# Patient Record
Sex: Female | Born: 1985 | Race: Black or African American | Hispanic: No | Marital: Single | Smoking: Never smoker
Health system: Southern US, Community
[De-identification: ages and names within clinical notes are randomized; demographics above are authoritative.]

## PROBLEM LIST (undated history)

## (undated) ENCOUNTER — Emergency Department (HOSPITAL_COMMUNITY): Admission: EM | Payer: Medicaid Other | Source: Home / Self Care

## (undated) DIAGNOSIS — I10 Essential (primary) hypertension: Secondary | ICD-10-CM

## (undated) DIAGNOSIS — O4400 Placenta previa specified as without hemorrhage, unspecified trimester: Secondary | ICD-10-CM

## (undated) DIAGNOSIS — G629 Polyneuropathy, unspecified: Secondary | ICD-10-CM

---

## 2005-12-09 IMAGING — CR DG CHEST 2V
2 series · 2 of 2 positions shown · non-contrast
Comparison: none
 The heart size and mediastinal contours are within normal limits.  Both lungs are clear.  The visualized skeletal structures are unremarkable.

CLINICAL DATA: Cough.
 CHEST - 2 VIEW:

[w chest pa]
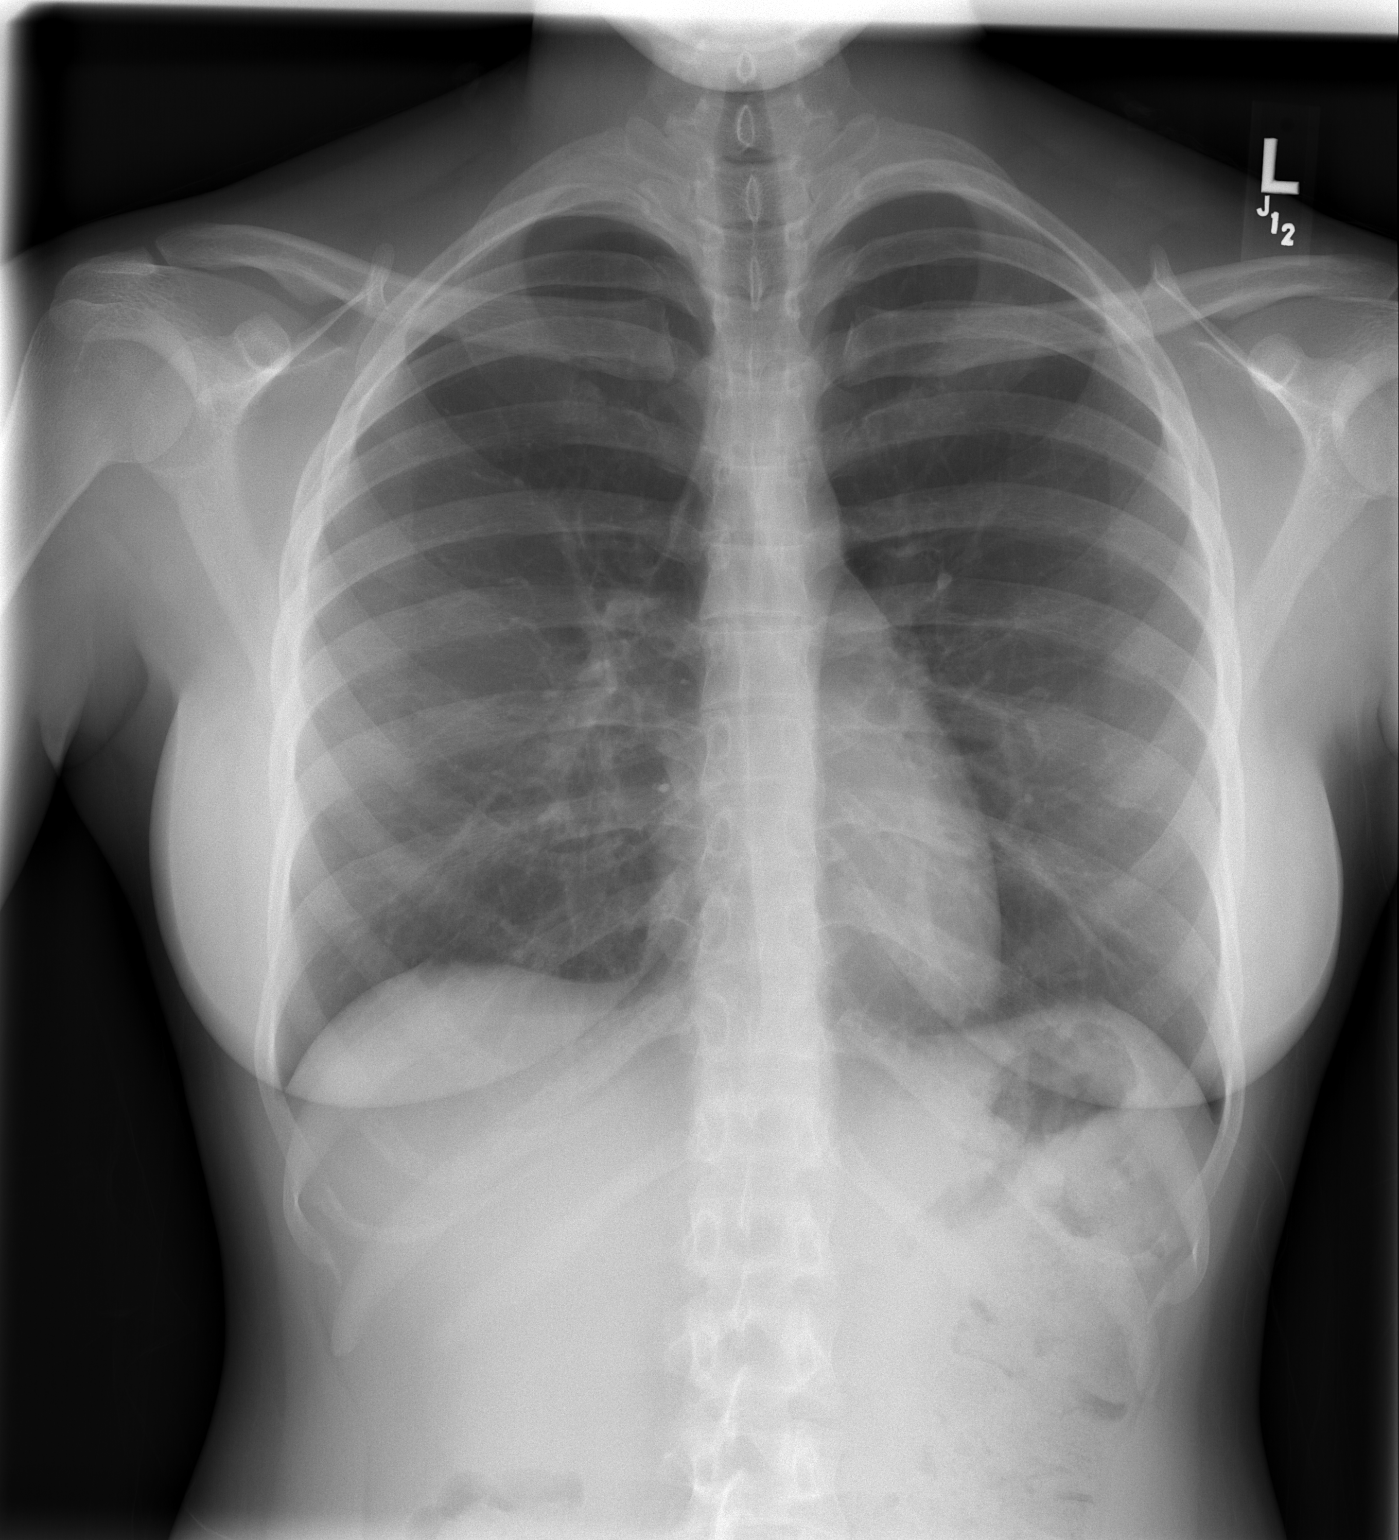

[w chest lat]
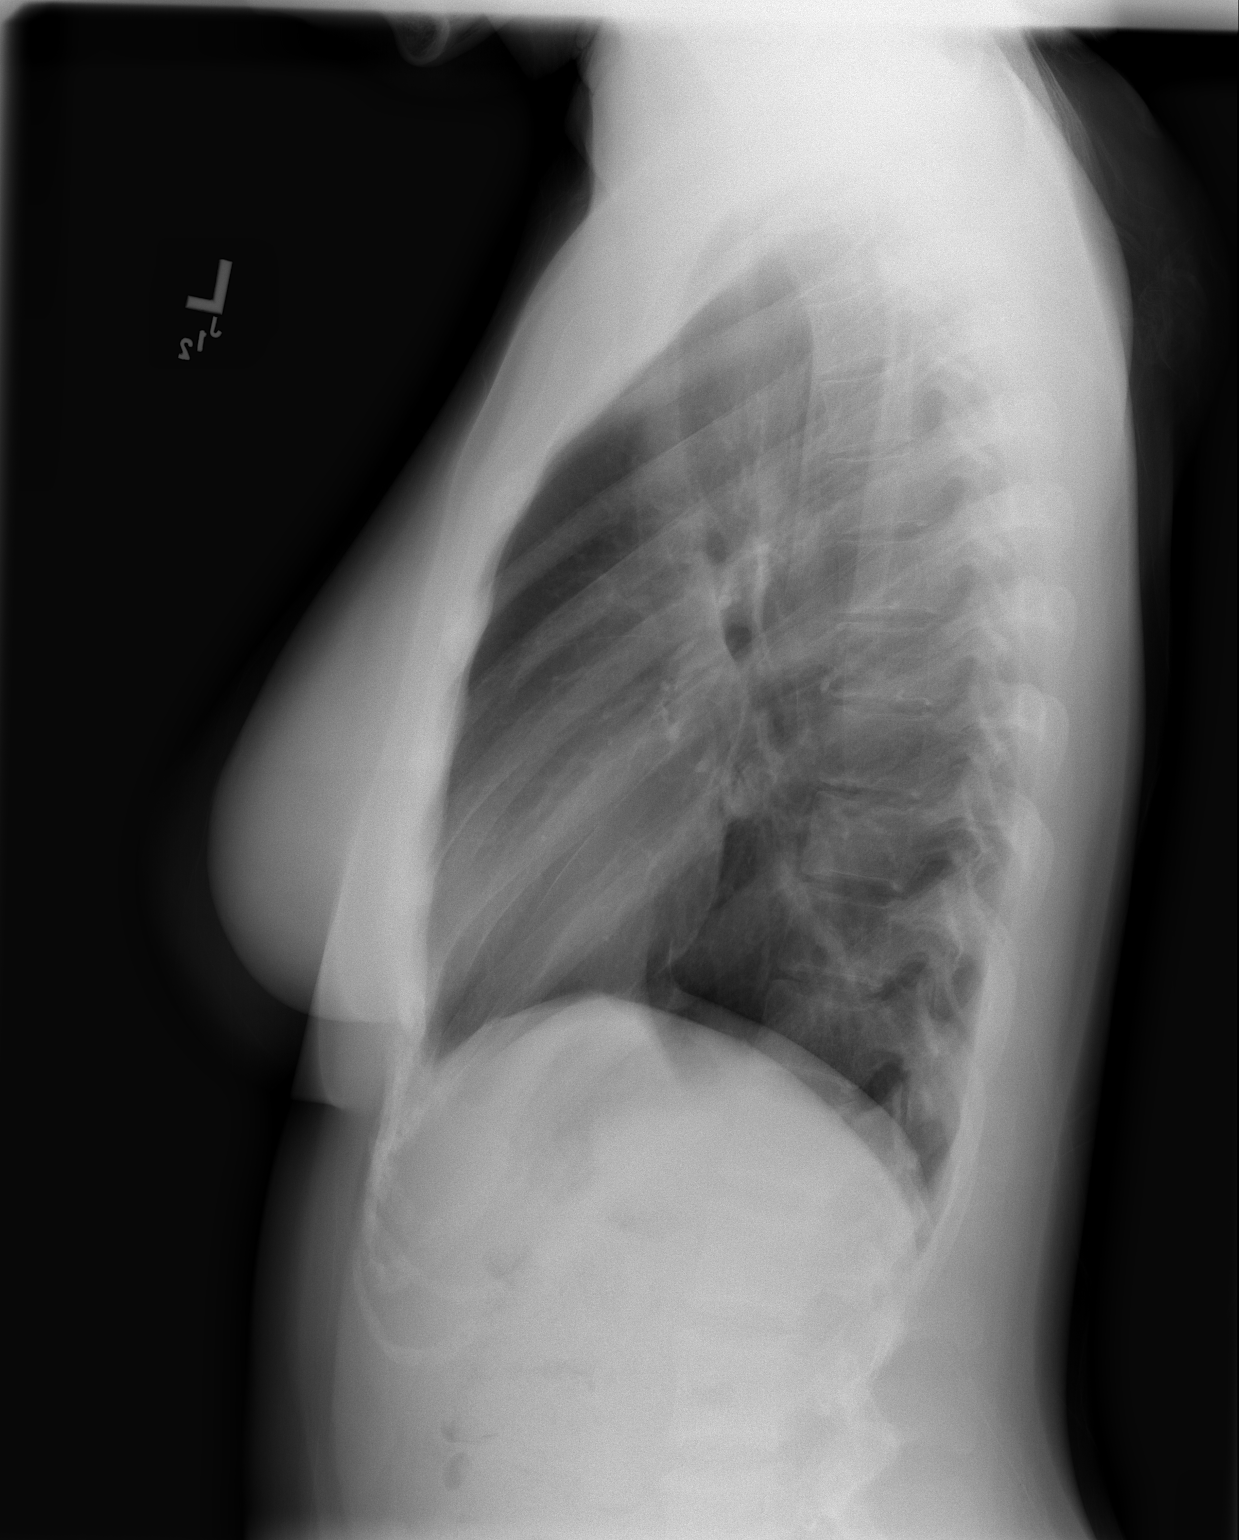

[2 of 2 positions shown; findings below may reference images not displayed]

IMPRESSION: No active cardiopulmonary disease.

## 2006-03-02 ENCOUNTER — Emergency Department (HOSPITAL_COMMUNITY): Admission: EM | Admit: 2006-03-02 | Discharge: 2006-03-02 | Payer: Self-pay | Admitting: Emergency Medicine

## 2007-04-22 ENCOUNTER — Inpatient Hospital Stay (HOSPITAL_COMMUNITY): Admission: AD | Admit: 2007-04-22 | Discharge: 2007-04-22 | Payer: Self-pay | Admitting: Obstetrics and Gynecology

## 2007-04-22 IMAGING — US US OB COMP LESS 14 WK
1 series · 14 of 28 positions shown · non-contrast
Comparison: none

OBSTETRICAL ULTRASOUND:
 This ultrasound exam was performed in the [HOSPITAL] Ultrasound Department.  The OB US report was generated in the AS system, and faxed to the ordering physician.  This report is also available in [REDACTED] PACS.

[Series 1: us ob comp less 14 wk · 0.19mm/px · 14 of 29 slices shown]
[im 2/29]
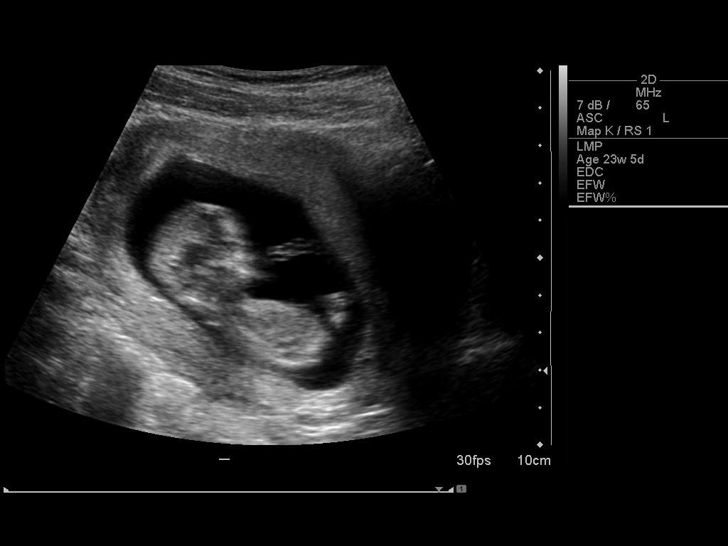
[im 4/29]
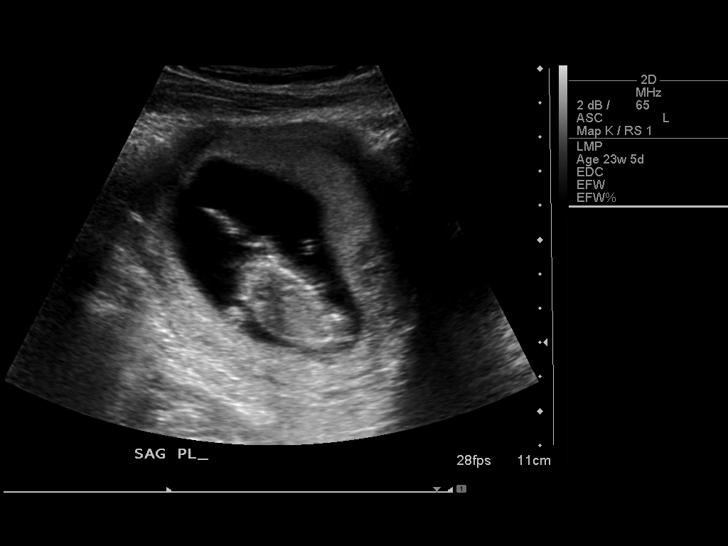
[im 6/29]
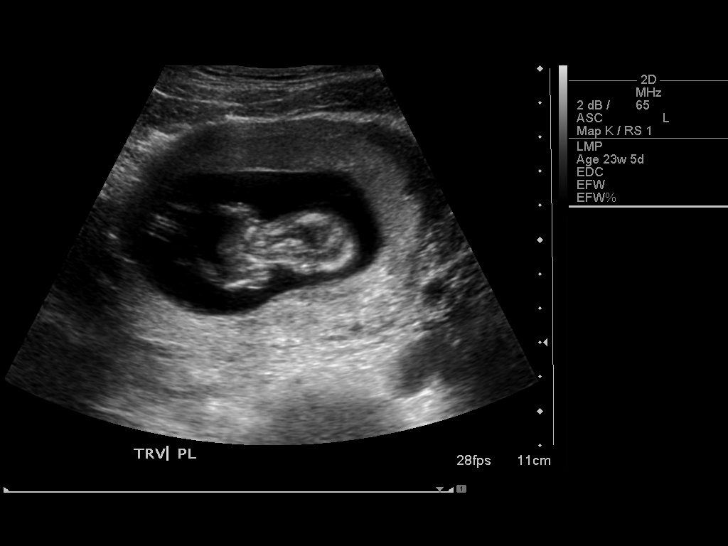
[im 8/29]
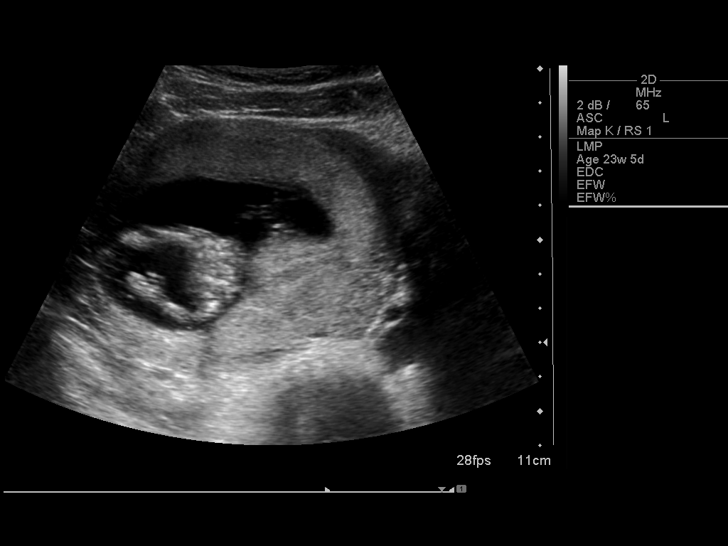
[im 10/29]
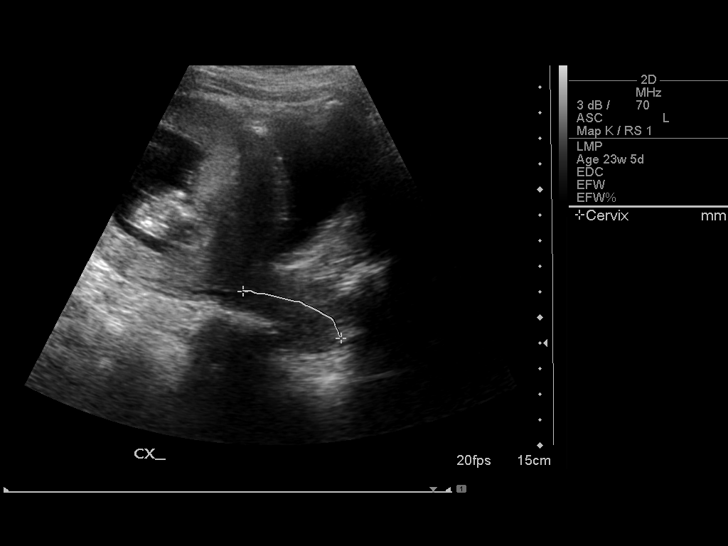
[im 12/29]
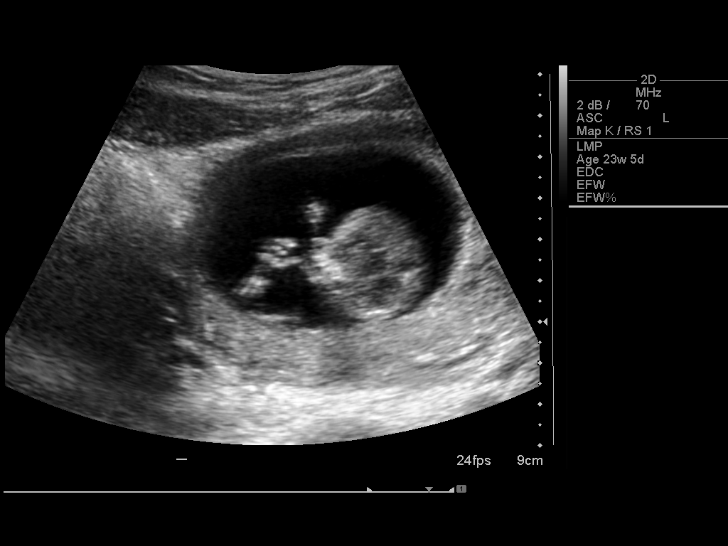
[im 14/29]
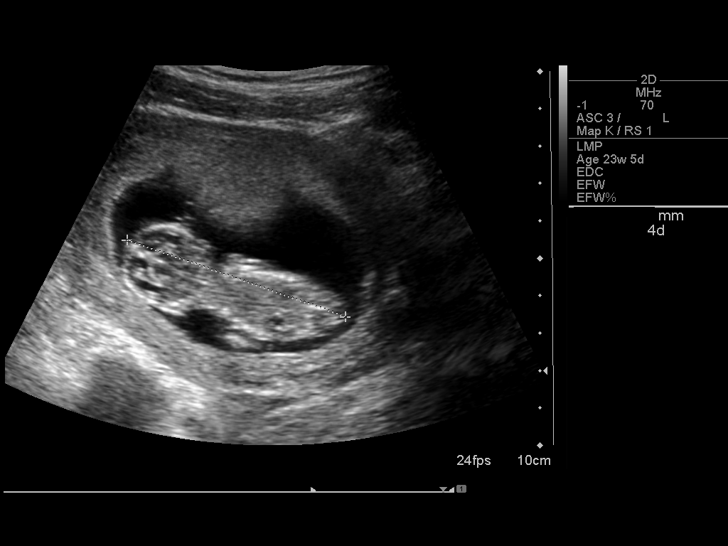
[im 16/29]
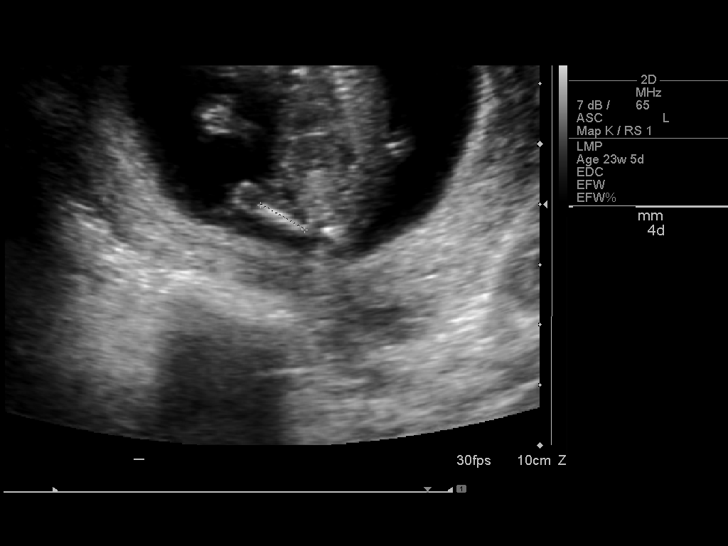
[im 18/29]
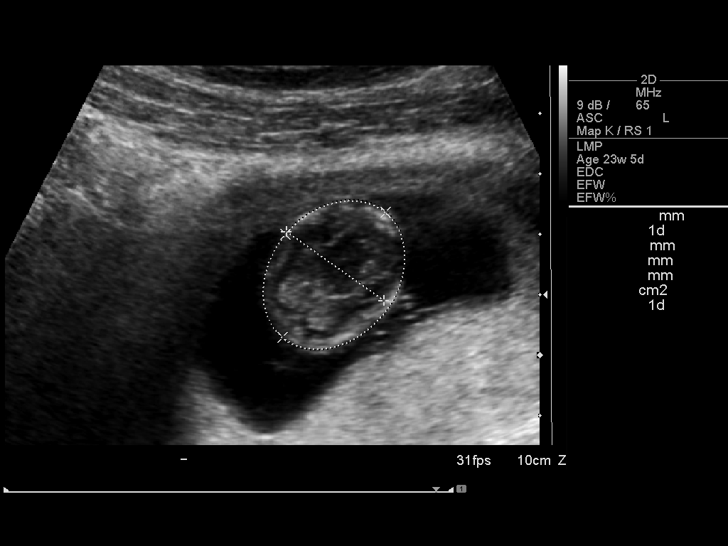
[im 20/29]
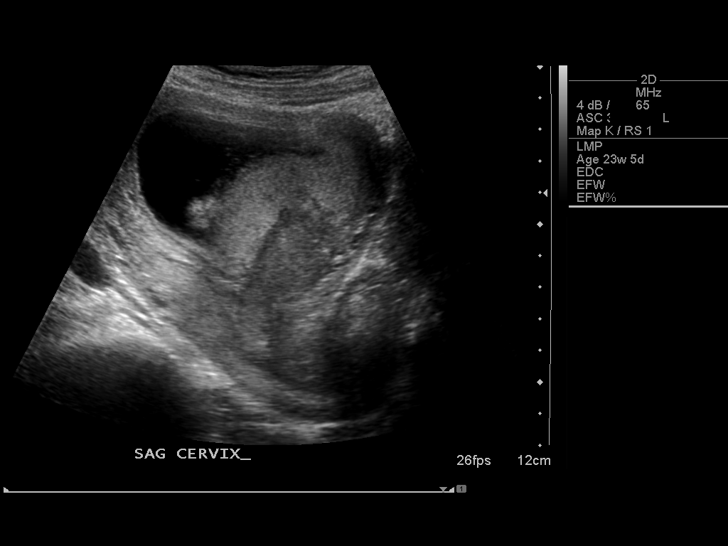
[im 22/29]
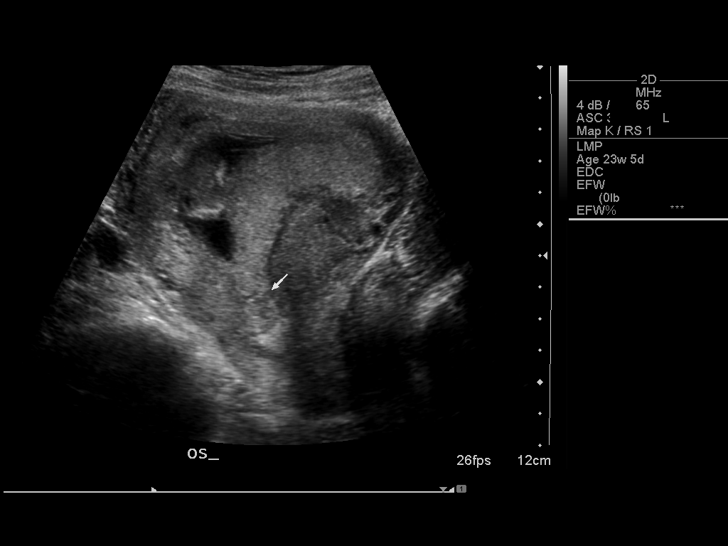
[im 24/29]
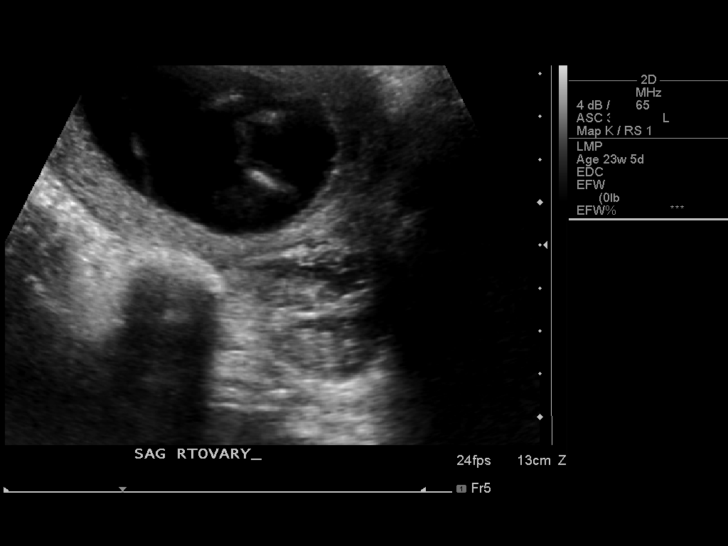
[im 26/29]
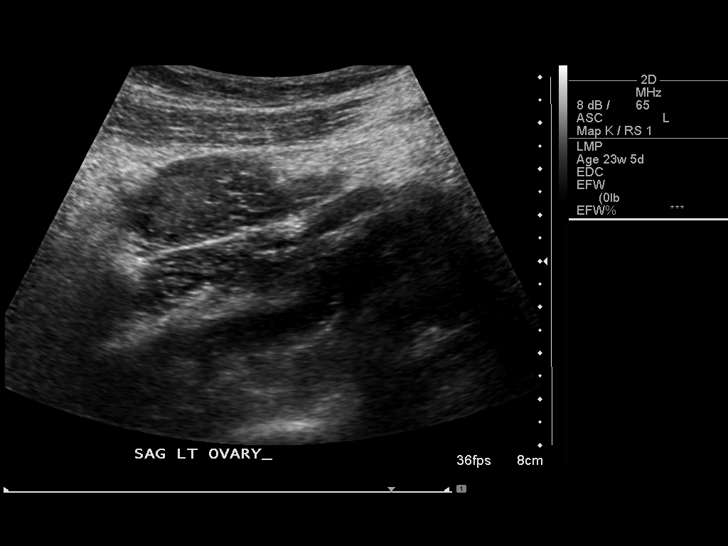
[im 29/29]
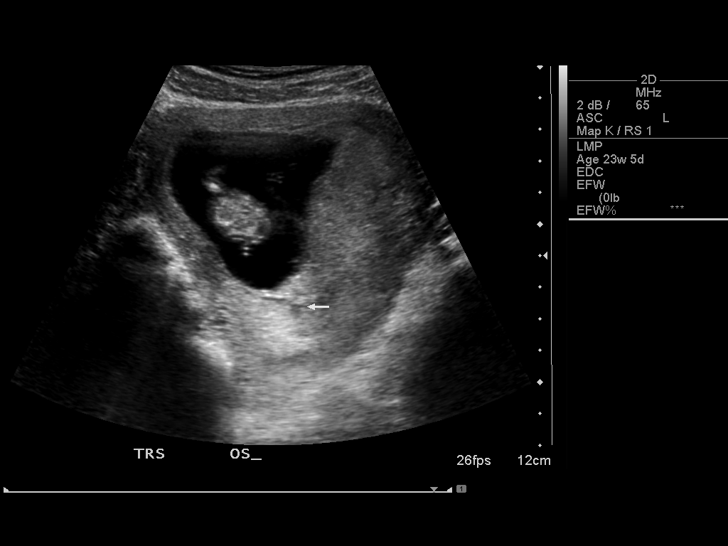

[14 of 28 positions shown; findings below may reference images not displayed]

IMPRESSION: See AS Obstetric US report.

## 2007-06-01 ENCOUNTER — Ambulatory Visit (HOSPITAL_COMMUNITY): Admission: RE | Admit: 2007-06-01 | Discharge: 2007-06-01 | Payer: Self-pay | Admitting: Obstetrics and Gynecology

## 2007-06-01 IMAGING — US US OB DETAIL+14 WK
1 series · 14 of 28 positions shown · non-contrast
Comparison: none

OBSTETRICAL ULTRASOUND:
 This ultrasound exam was performed in the [HOSPITAL] Ultrasound Department.  The OB US report was generated in the AS system, and faxed to the ordering physician.  This report is also available in [REDACTED] PACS.

[Series 1: us ob detail +14 wk · 14 of 87 slices shown]
[im 4/87]
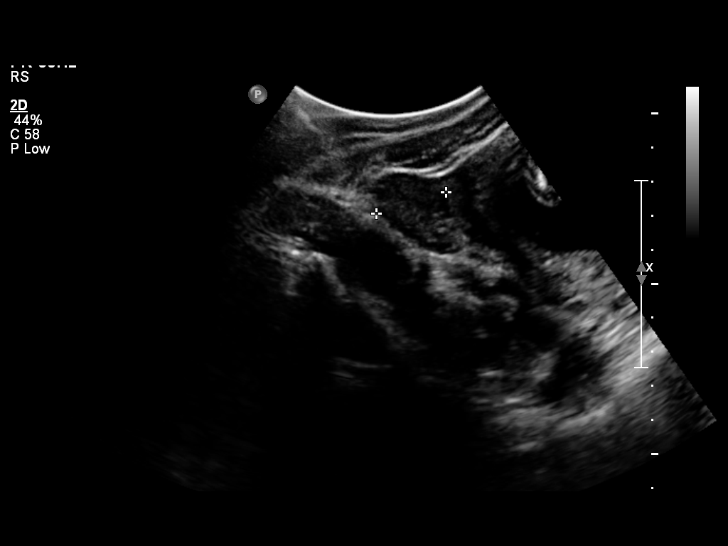
[im 10/87]
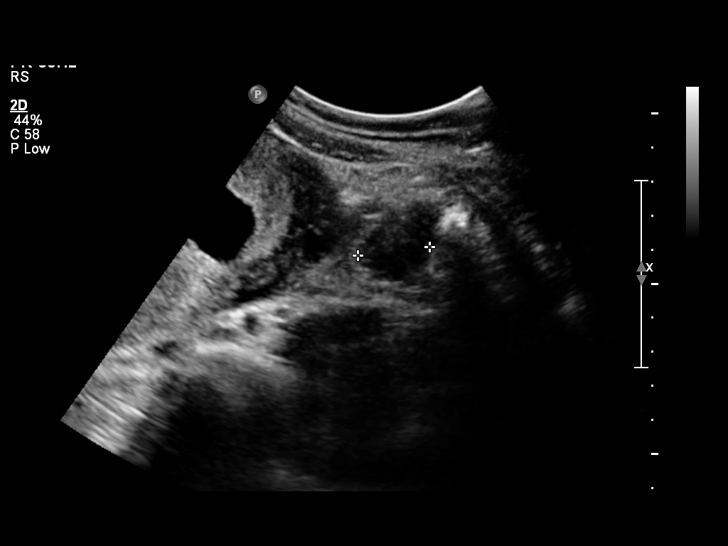
[im 16/87]
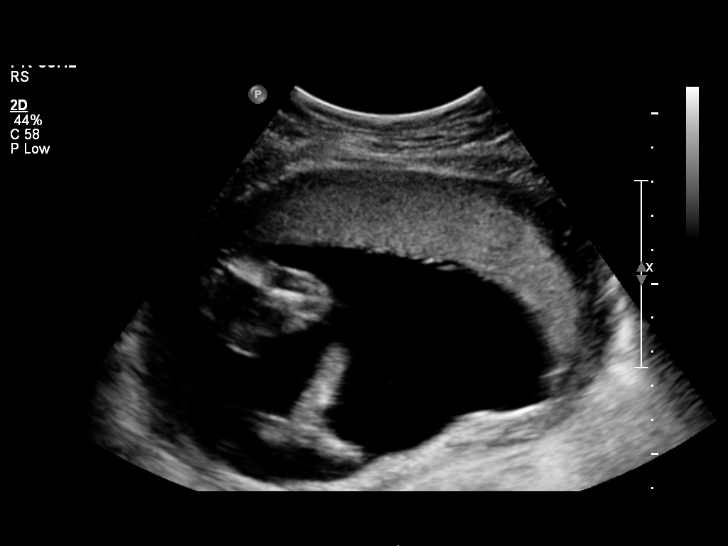
[im 23/87]
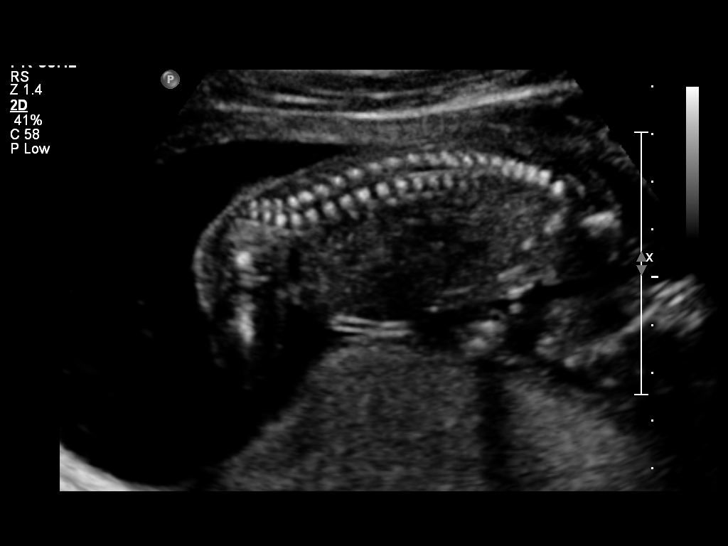
[im 29/87]
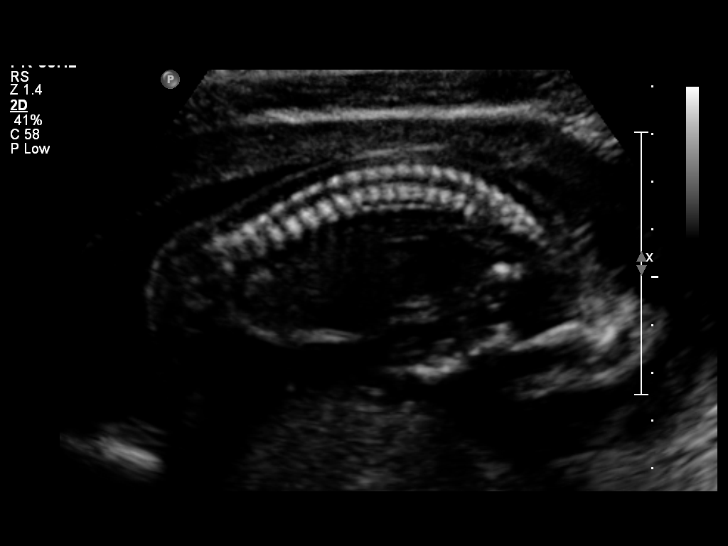
[im 36/87]
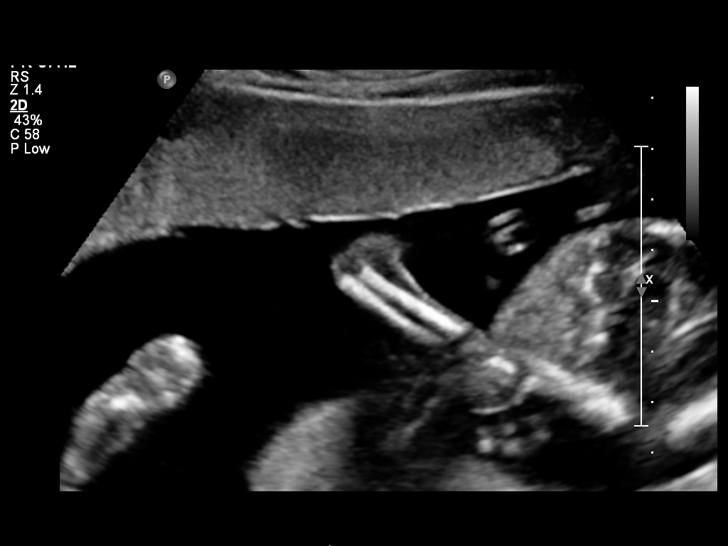
[im 42/87]
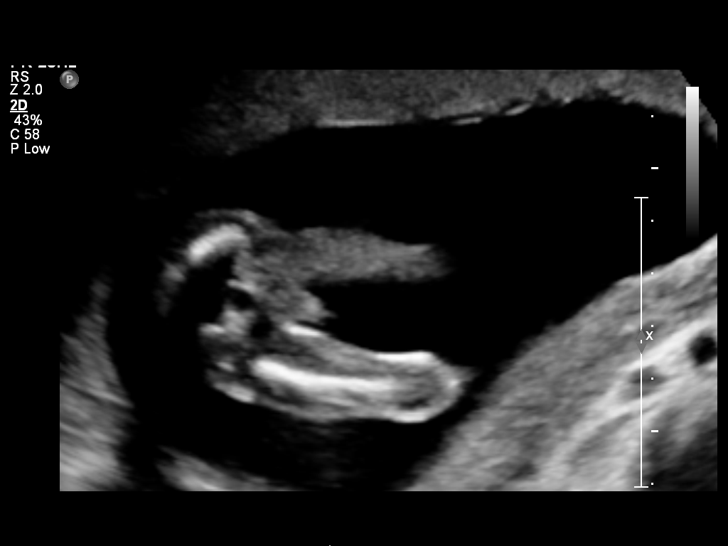
[im 48/87]
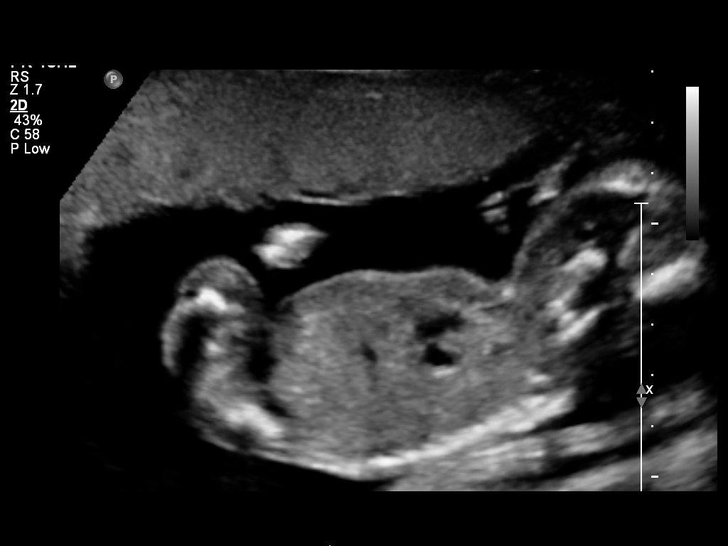
[im 55/87]
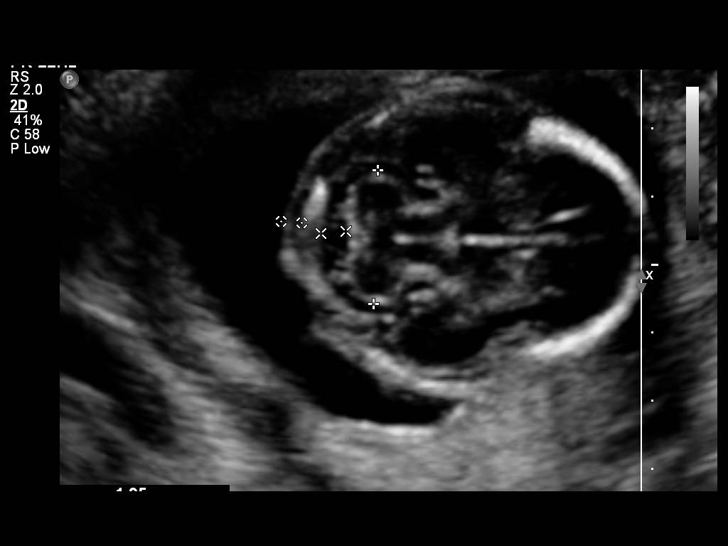
[im 61/87]
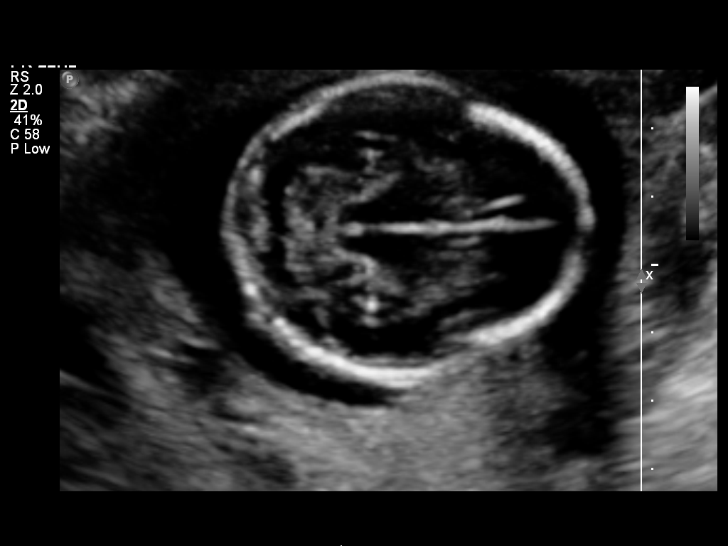
[im 67/87]
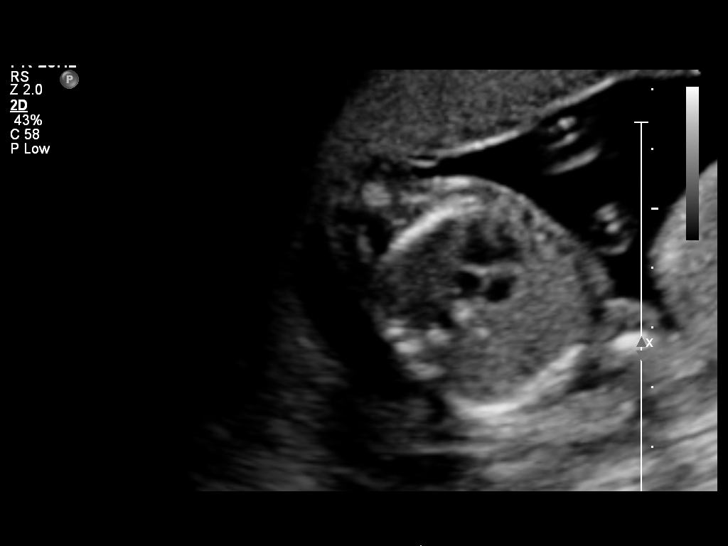
[im 74/87]
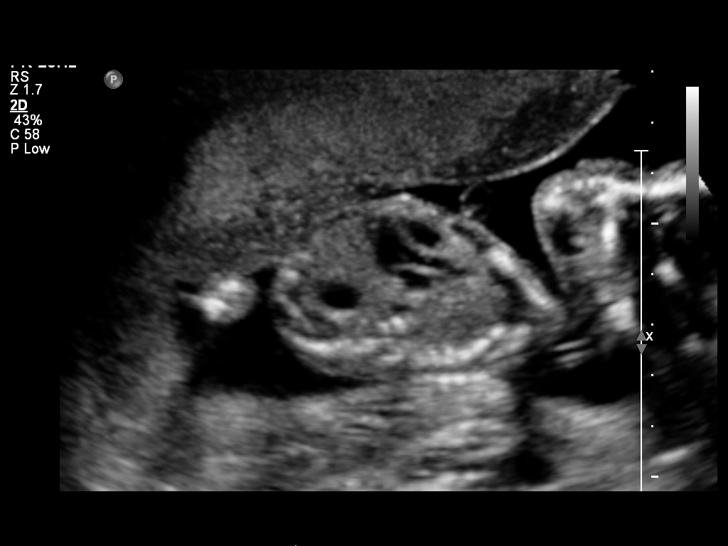
[im 80/87]
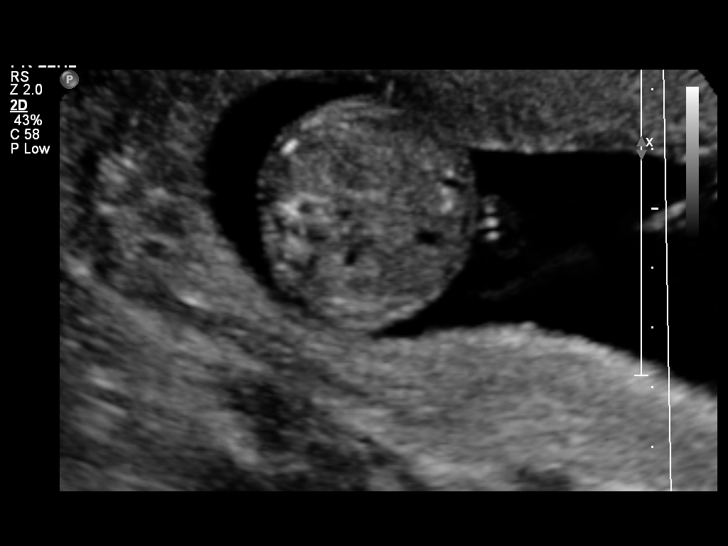
[im 87/87]
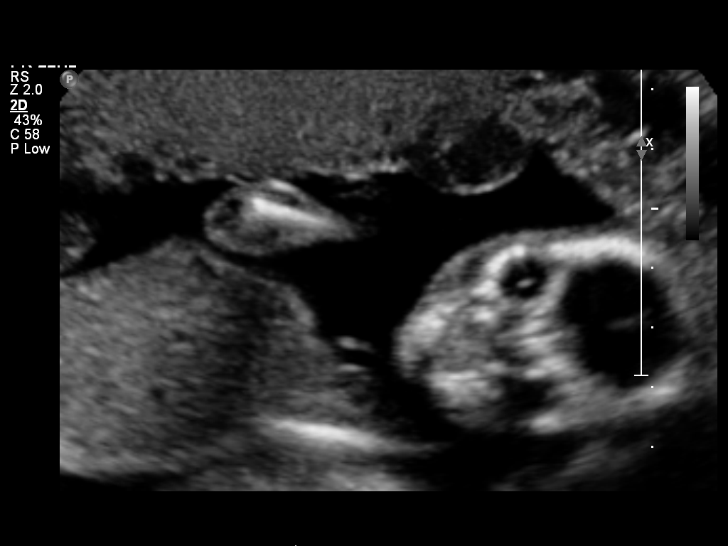

[14 of 28 positions shown; findings below may reference images not displayed]

IMPRESSION: See AS Obstetric US report.

## 2007-09-10 ENCOUNTER — Ambulatory Visit (HOSPITAL_COMMUNITY): Admission: RE | Admit: 2007-09-10 | Discharge: 2007-09-10 | Payer: Self-pay | Admitting: Obstetrics

## 2007-09-10 IMAGING — US US OB LIMITED
1 series · 14 of 28 positions shown · non-contrast
Comparison: none

OBSTETRICAL ULTRASOUND:
 This ultrasound was performed in The [HOSPITAL], and the [facility] OB/GYN report will be stored to [REDACTED] PACS.

[Series 1: us ob limited · 14 of 56 slices shown]
[im 3/56]
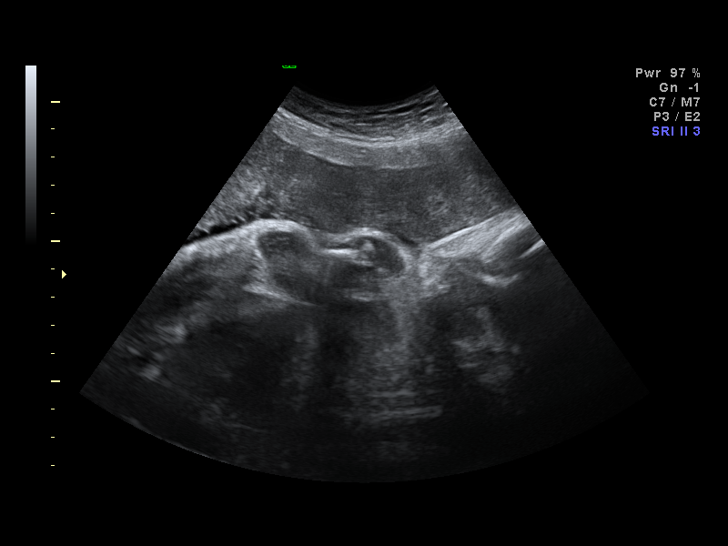
[im 7/56]
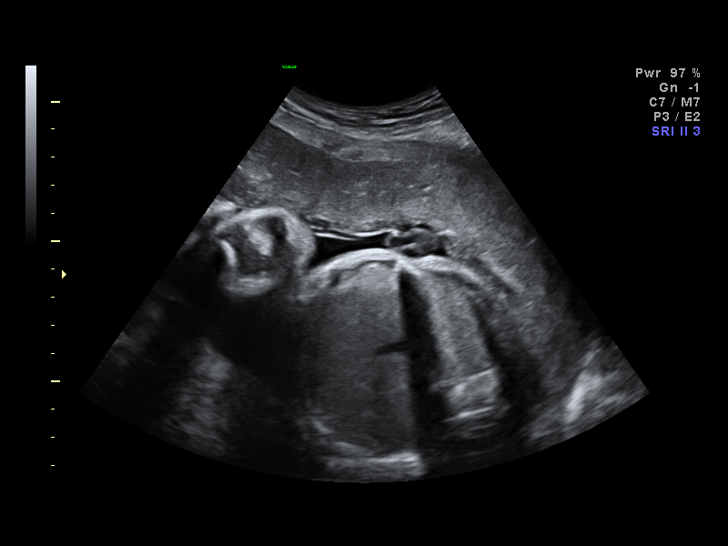
[im 11/56]
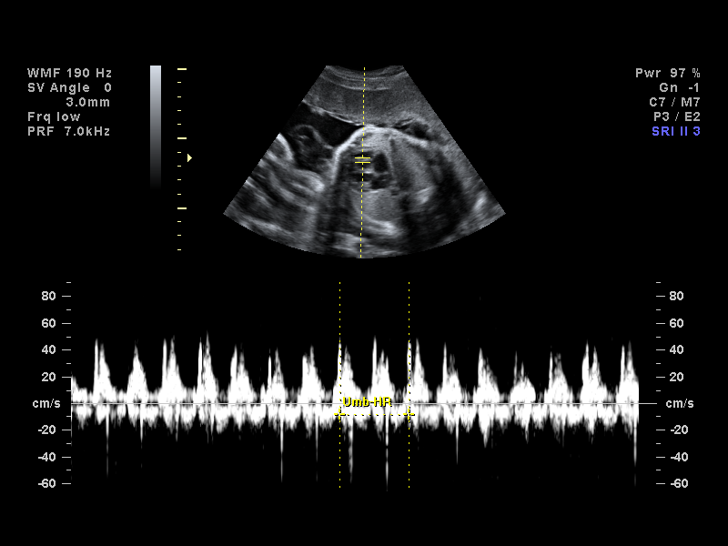
[im 15/56]
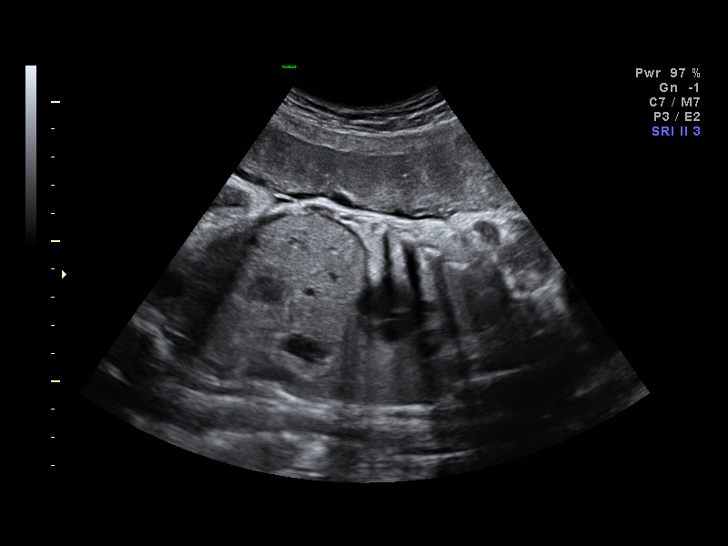
[im 19/56]
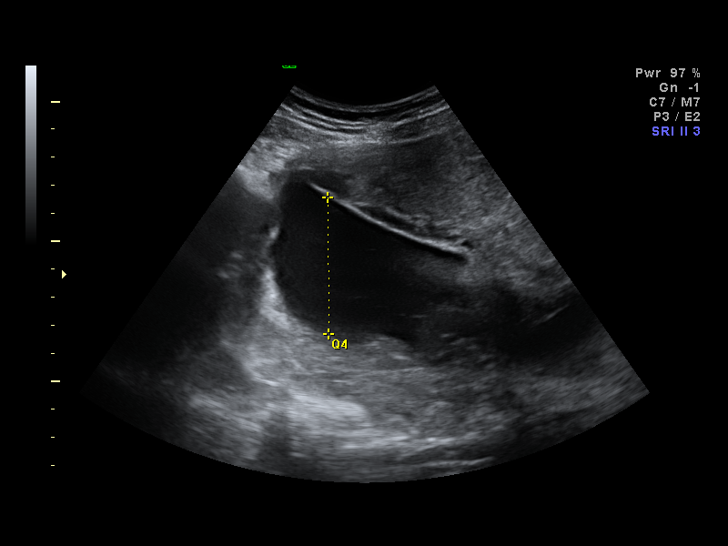
[im 23/56]
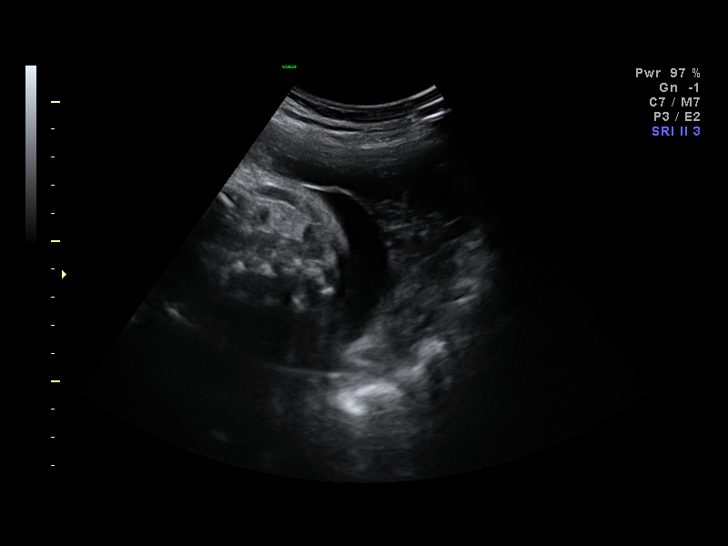
[im 27/56]
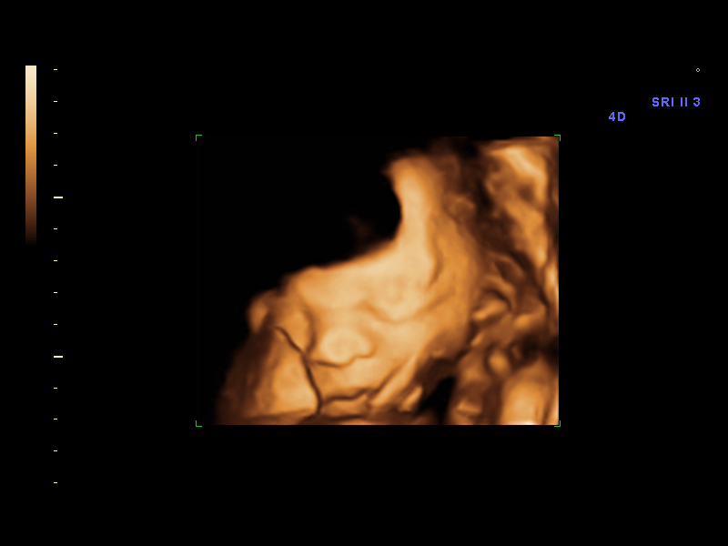
[im 31/56]
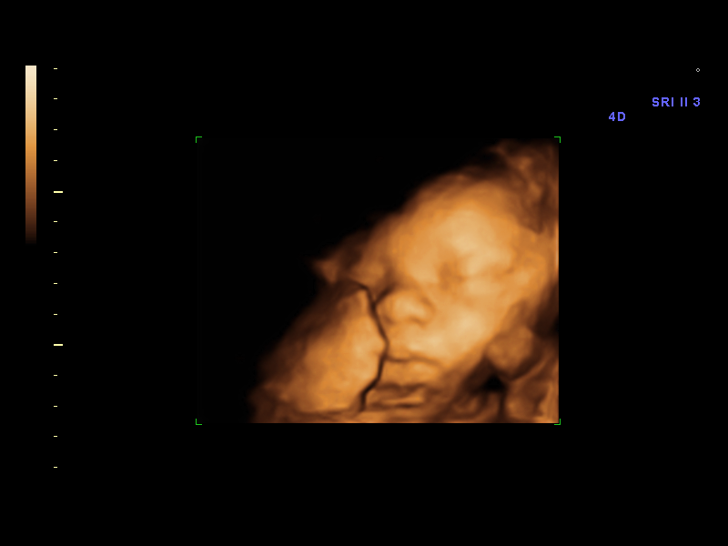
[im 35/56]
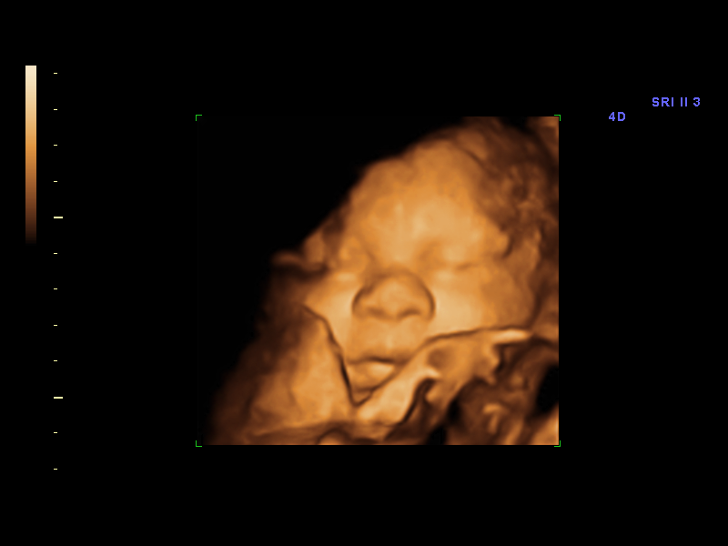
[im 39/56]
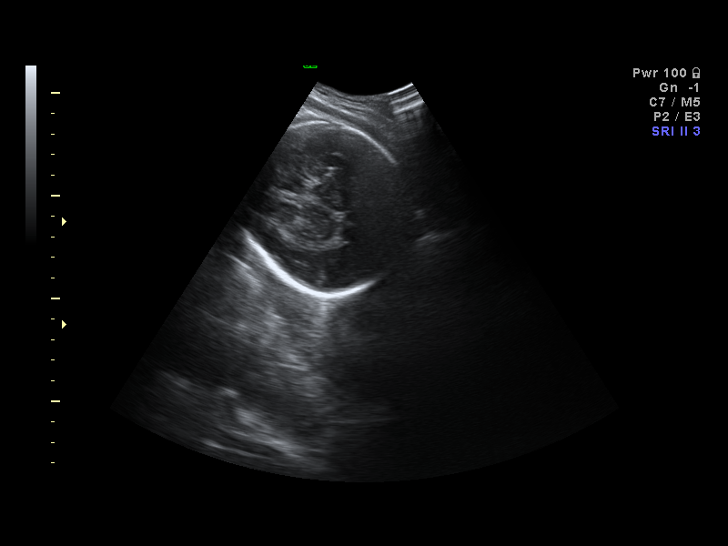
[im 43/56]
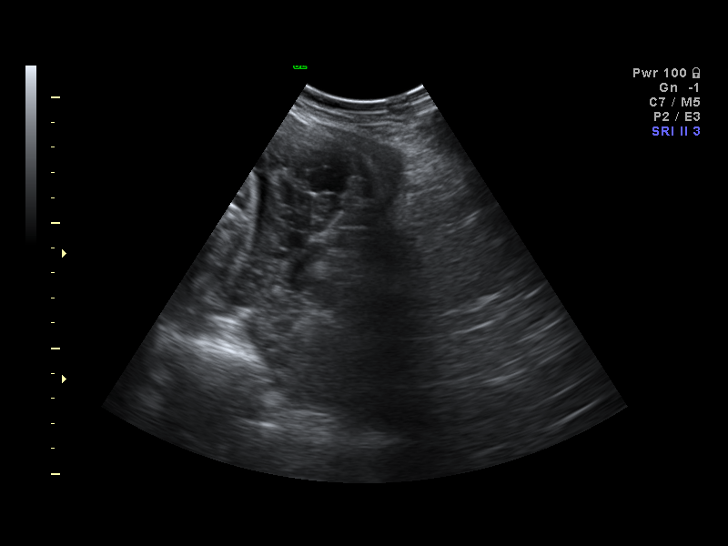
[im 47/56]
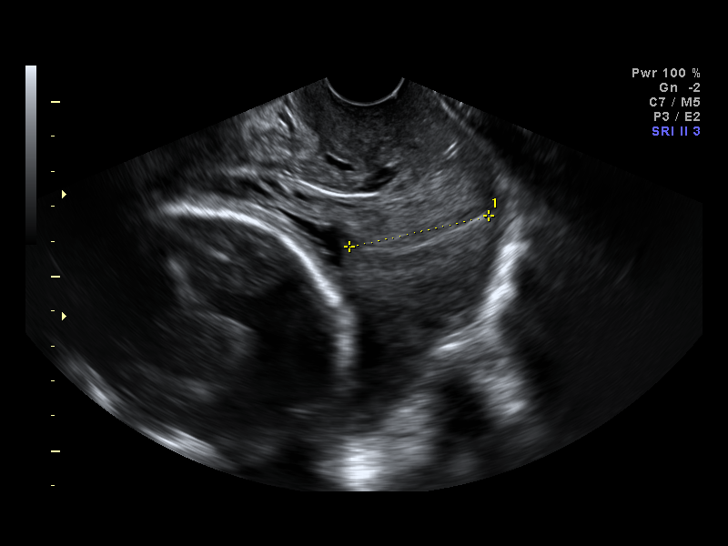
[im 51/56]
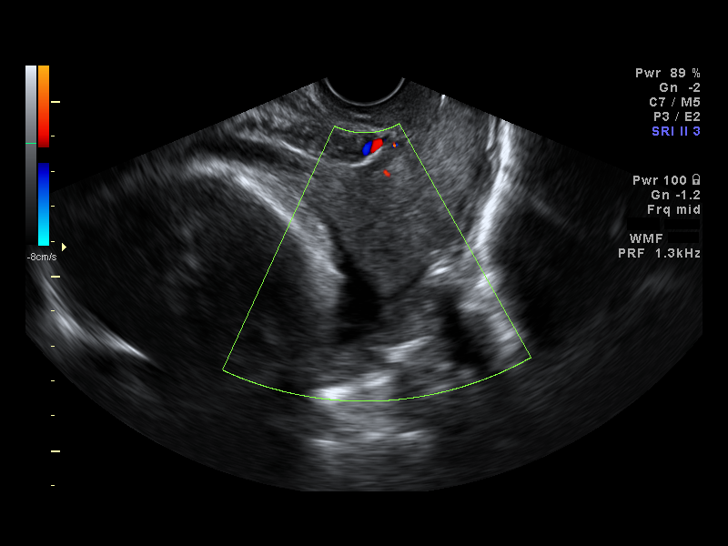
[im 56/56]
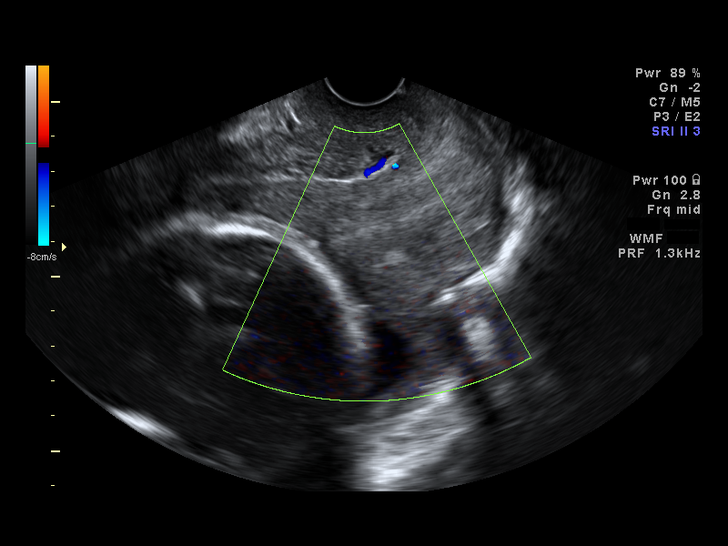

[14 of 28 positions shown; findings below may reference images not displayed]

IMPRESSION: [facility] OB/GYN has also been faxed to the ordering physician.

## 2007-11-04 ENCOUNTER — Inpatient Hospital Stay (HOSPITAL_COMMUNITY): Admission: AD | Admit: 2007-11-04 | Discharge: 2007-11-06 | Payer: Self-pay | Admitting: Obstetrics

## 2010-10-22 LAB — URINE CULTURE: Colony Count: >=100000

## 2010-10-22 LAB — URINALYSIS, ROUTINE W REFLEX MICROSCOPIC
Bilirubin Urine: NEGATIVE
Ketones, ur: NEGATIVE
Nitrite: POSITIVE — AB
Specific Gravity, Urine: 1.015
Urobilinogen, UA: 0.2
pH: 7

## 2010-10-22 LAB — WET PREP, GENITAL: Clue Cells Wet Prep HPF POC: NONE SEEN

## 2010-10-22 LAB — GC/CHLAMYDIA PROBE AMP, GENITAL: Chlamydia, DNA Probe: NEGATIVE

## 2010-10-22 LAB — CBC
MCV: 88.9
RDW: 14
WBC: 7.7

## 2010-10-22 LAB — URINE MICROSCOPIC-ADD ON

## 2010-10-29 LAB — RPR: RPR Ser Ql: NONREACTIVE

## 2010-10-29 LAB — CREATININE, SERUM: Creatinine, Ser: 0.78

## 2010-10-29 LAB — CBC
Hemoglobin: 10.6 — ABNORMAL LOW
MCHC: 33.1
MCHC: 33.5
RBC: 3.64 — ABNORMAL LOW
RDW: 15
WBC: 21.3 — ABNORMAL HIGH
WBC: 8.8

## 2012-05-02 ENCOUNTER — Encounter (HOSPITAL_COMMUNITY): Payer: Self-pay | Admitting: Emergency Medicine

## 2012-05-02 ENCOUNTER — Emergency Department (HOSPITAL_COMMUNITY)
Admission: EM | Admit: 2012-05-02 | Discharge: 2012-05-02 | Disposition: A | Discharge status: Home / Self Care | Payer: Medicaid Other | Attending: Emergency Medicine | Admitting: Emergency Medicine

## 2012-05-02 DIAGNOSIS — R0609 Other forms of dyspnea: Secondary | ICD-10-CM | POA: Insufficient documentation

## 2012-05-02 DIAGNOSIS — R0989 Other specified symptoms and signs involving the circulatory and respiratory systems: Secondary | ICD-10-CM | POA: Insufficient documentation

## 2012-05-02 DIAGNOSIS — R51 Headache: Secondary | ICD-10-CM | POA: Insufficient documentation

## 2012-05-02 DIAGNOSIS — K047 Periapical abscess without sinus: Secondary | ICD-10-CM | POA: Insufficient documentation

## 2012-05-02 DIAGNOSIS — R220 Localized swelling, mass and lump, head: Secondary | ICD-10-CM | POA: Insufficient documentation

## 2012-05-02 DIAGNOSIS — R221 Localized swelling, mass and lump, neck: Secondary | ICD-10-CM | POA: Insufficient documentation

## 2012-05-02 MED ORDER — HYDROCODONE-ACETAMINOPHEN 5-325 MG PO TABS: ORAL_TABLET | ORAL | Status: DC

## 2012-05-02 MED ORDER — IBUPROFEN 600 MG PO TABS: 600.0000 mg | ORAL_TABLET | Freq: Four times a day (QID) | ORAL | Status: DC | PRN

## 2012-05-02 MED ORDER — PENICILLIN V POTASSIUM 500 MG PO TABS: 500.0000 mg | ORAL_TABLET | Freq: Three times a day (TID) | ORAL | Status: DC

## 2012-05-02 NOTE — ED Provider Notes (Signed)
History    This chart was scribed for non-physician practitioner working with Nelia Shi, MD by Frederik Pear, ED Scribe. This patient was seen in room WTR7/WTR7 and the patient's care was started at 1533.   CSN: [id number]  Arrival date & time 05/02/12  1434   First MD Initiated Contact with Patient 05/02/12 1533      Chief Complaint  Patient presents with  . Dental Pain    (Consider location/radiation/quality/duration/timing/severity/associated sxs/prior treatment) The history is provided by the patient and medical records. No language interpreter was used.    Tanya Carroll is a 27 y.o. female who presents to the Emergency Department complaining of sudden onset, gradually worsening dental pain that began 7 days ago with associated headache and left-sided facial swelling with radiating jaw pain that began yesterday when she awoke. She denies any SOB, difficulty swallowing, or fever. She reports that she has been treating the dental pain at home with Advil with no relief. She is established with a dentist, but is unable to get an appointment until Apr 29.  History reviewed. No pertinent past medical history.  History reviewed. No pertinent past surgical history.  Family History  Problem Relation Age of Onset  . Hypertension Mother   . Hypertension Sister     History  Substance Use Topics  . Smoking status: Not on file  . Smokeless tobacco: Never Used  . Alcohol Use: Yes     Comment: Once or twice a week.    OB History   Grav Para Term Preterm Abortions TAB SAB Ect Mult Living   1 1 1              Review of Systems  Constitutional: Negative for fever.  HENT: Positive for facial swelling and dental problem. Negative for ear pain, sore throat, trouble swallowing and neck pain.   Respiratory: Negative for shortness of breath and stridor.   Skin: Negative for color change.  Neurological: Positive for headaches.    Allergies  Review of patient's allergies  indicates no known allergies.  Home Medications   Current Outpatient Rx  Name  Route  Sig  Dispense  Refill  . ibuprofen (ADVIL,MOTRIN) 200 MG tablet   Oral   Take 400 mg by mouth every 6 (six) hours as needed for pain.           BP 125/84  Pulse 77  Temp(Src) 98.1 F (36.7 C) (Oral)  Ht 5\' 3"  (1.6 m)  Wt 165 lb (74.844 kg)  BMI 29.24 kg/m2  SpO2 100%  Physical Exam  Nursing note and vitals reviewed. Constitutional: She appears well-developed and well-nourished.  HENT:  Head: Normocephalic and atraumatic. No trismus in the jaw.  Right Ear: Tympanic membrane, external ear and ear canal normal.  Left Ear: Tympanic membrane, external ear and ear canal normal.  Nose: Nose normal.  Mouth/Throat: Uvula is midline, oropharynx is clear and moist and mucous membranes are normal. Abnormal dentition. Dental caries present. No dental abscesses or edematous. No tonsillar abscesses.  Second left mandibular molar is broken, and gumline tenderness is present in that area. No palpable abscess. Erythema and swelling over the angle of the left jaw.  Eyes: Conjunctivae are normal.  Neck: Normal range of motion. Neck supple.  No neck swelling or Ludwig's angina  Pulmonary/Chest: She is in respiratory distress.  Abdominal: There is no tenderness.  Musculoskeletal: Normal range of motion. She exhibits no edema.  Lymphadenopathy:    She has no cervical adenopathy.  Neurological:  She is alert. Coordination normal.  Skin: Skin is warm and dry.  Psychiatric: She has a normal mood and affect. Her behavior is normal. Judgment and thought content normal.    ED Course  Procedures (including critical care time)  DIAGNOSTIC STUDIES: Oxygen Saturation is 100% on room air, normal by my interpretation.    COORDINATION OF CARE:  15:43- Discussed planned course of treatment with the patient, including Vicodin, Advil, and Veetid, who is agreeable at this time.  Labs Reviewed - No data to display No  results found.   1. Dental abscess     Patient seen and examined.   Vital signs reviewed and are as follows: Filed Vitals:   05/02/12 1500  BP: 125/84  Pulse: 77  Temp: 98.1 F (36.7 C)    Patient counseled to take prescribed medications as directed, return with worsening facial or neck swelling, and to follow-up with their dentist as soon as possible.   Patient counseled on use of narcotic pain medications. Counseled not to combine these medications with others containing tylenol. Urged not to drink alcohol, drive, or perform any other activities that requires focus while taking these medications. The patient verbalizes understanding and agrees with the plan.   MDM  Patient with toothache with likely early periapical abscess  Exam unconcerning for Ludwig's angina or other deep tissue infection in neck.  Will treat with penicillin and pain medicine.  Urged patient to follow-up with dentist.     I personally performed the services described in this documentation, which was scribed in my presence. The recorded information has been reviewed and is accurate.        Renne Crigler, PA-C 05/02/12 1640

## 2012-05-02 NOTE — ED Notes (Addendum)
Pt states tooth on left side has been hurting since last Sunday. Pt states she woke up yesterday and left side of her mouth was swollen and painful. Pt states she has been taking Advil to relieve tooth pain but med is uneffective.

## 2012-05-03 NOTE — ED Provider Notes (Signed)
Medical screening examination/treatment/procedure(s) were performed by non-physician practitioner and as supervising physician I was immediately available for consultation/collaboration.    Elek Holderness L Leyani Gargus, MD 05/03/12 1551 

## 2012-05-15 ENCOUNTER — Encounter (HOSPITAL_COMMUNITY): Payer: Self-pay | Admitting: *Deleted

## 2012-05-15 ENCOUNTER — Emergency Department (HOSPITAL_COMMUNITY)
Admission: EM | Admit: 2012-05-15 | Discharge: 2012-05-15 | Disposition: A | Discharge status: Home / Self Care | Payer: Medicaid Other | Attending: Emergency Medicine | Admitting: Emergency Medicine

## 2012-05-15 DIAGNOSIS — K089 Disorder of teeth and supporting structures, unspecified: Secondary | ICD-10-CM | POA: Insufficient documentation

## 2012-05-15 DIAGNOSIS — K0889 Other specified disorders of teeth and supporting structures: Secondary | ICD-10-CM

## 2012-05-15 DIAGNOSIS — R42 Dizziness and giddiness: Secondary | ICD-10-CM | POA: Insufficient documentation

## 2012-05-15 DIAGNOSIS — K029 Dental caries, unspecified: Secondary | ICD-10-CM | POA: Insufficient documentation

## 2012-05-15 DIAGNOSIS — R51 Headache: Secondary | ICD-10-CM | POA: Insufficient documentation

## 2012-05-15 DIAGNOSIS — R630 Anorexia: Secondary | ICD-10-CM | POA: Insufficient documentation

## 2012-05-15 DIAGNOSIS — R1310 Dysphagia, unspecified: Secondary | ICD-10-CM | POA: Insufficient documentation

## 2012-05-15 HISTORY — DX: Complete placenta previa nos or without hemorrhage, unspecified trimester: O44.00

## 2012-05-15 MED ORDER — HYDROCODONE-ACETAMINOPHEN 5-325 MG PO TABS: ORAL_TABLET | ORAL | Status: DC

## 2012-05-15 MED ORDER — IBUPROFEN 600 MG PO TABS: 600.0000 mg | ORAL_TABLET | Freq: Four times a day (QID) | ORAL | Status: DC | PRN

## 2012-05-15 NOTE — ED Notes (Signed)
Pt noticed gums swollen when she woke up this am. States that she had come to the ED last week and was found to have a dental abcess. Pt states that she was discharged with prescriptions for antibiotics and pain pills. She completed antibiotics and is scheduled to have tooth extraction this coming Tuesday. Pt states that oral pain is unbearable, making it difficult to eat and is making her dizzy.

## 2012-05-15 NOTE — ED Provider Notes (Signed)
History    This chart was scribed for non-physician practitioner, Junius Finner, PA-C, working with Suzi Roots, MD by Melba Coon, ED Scribe. This patient was seen in room WTR5/WTR5 and the patient's care was started at 7:40PM.     CSN: [id number]  Arrival date & time 05/15/12  1911   First MD Initiated Contact with Patient 05/15/12 1928      Chief Complaint  Patient presents with  . Oral Swelling    (Consider location/radiation/quality/duration/timing/severity/associated sxs/prior treatment) The history is provided by the patient. No language interpreter was used.   Tanya Carroll is a 27 y.o. female who presents to the Emergency Department complaining of persistent, moderate to severe frontal oral and gum swelling with an onset this morning. She was here at the ED last week for a dental abscess to the left posterior mouth that was not fully developed and was discharged with antibiotics and pain medications (Vicodin), which alleviated the pain and abscess. She reports she completed her antibiotic course and is scheduled for a tooth extraction in the next week. However, her abscess is starting to come back. As far as her frontal oral pain, she reports that she dropped her phone into her open mouth and hit her front teeth yesterday. She reports painful dysphagia and headache. She reports decreased appetite and dizziness secondary to the pain. Denies fever, neck pain, sore throat, rash, back pain, CP, SOB, abdominal pain, nausea, emesis, diarrhea, dysuria, or extremity pain, edema, weakness, numbness, or tingling. No known allergies. No other pertinent medical symptoms.  Past Medical History  Diagnosis Date  . Placenta previa     History reviewed. No pertinent past surgical history.  Family History  Problem Relation Age of Onset  . Hypertension Mother   . Hypertension Sister     History  Substance Use Topics  . Smoking status: Never Smoker   . Smokeless tobacco: Never  Used  . Alcohol Use: 1.2 oz/week    2 Shots of liquor per week     Comment: Once or twice a week.    OB History   Grav Para Term Preterm Abortions TAB SAB Ect Mult Living   1 1 1              Review of Systems 10 Systems reviewed and all are negative for acute change except as noted in the HPI.   Allergies  Review of patient's allergies indicates no known allergies.  Home Medications   Current Outpatient Rx  Name  Route  Sig  Dispense  Refill  . HYDROcodone-acetaminophen (NORCO/VICODIN) 5-325 MG per tablet      Take 1-2 tablets every 6 hours as needed for severe pain   12 tablet   0   . HYDROcodone-acetaminophen (NORCO/VICODIN) 5-325 MG per tablet      Take 1-2 tablets every six hours as needed for pain.   10 tablet   0   . ibuprofen (ADVIL,MOTRIN) 600 MG tablet   Oral   Take 1 tablet (600 mg total) by mouth every 6 (six) hours as needed for pain.   30 tablet   0   . penicillin v potassium (VEETID) 500 MG tablet   Oral   Take 1 tablet (500 mg total) by mouth 3 (three) times daily.   21 tablet   0     BP 133/92  Pulse 103  Temp(Src) 98.4 F (36.9 C) (Oral)  Resp 18  Ht 5\' 3"  (1.6 m)  Wt 165 lb (74.844 kg)  BMI 29.24 kg/m2  SpO2 100%  Physical Exam  Nursing note and vitals reviewed. Constitutional: She is oriented to person, place, and time. She appears well-developed and well-nourished.  HENT:  Head: Normocephalic and atraumatic. No trismus in the jaw.  Right Ear: External ear normal.  Left Ear: External ear normal.  Nose: Nose normal.  Mouth/Throat: Uvula is midline, oropharynx is clear and moist and mucous membranes are normal. Mucous membranes are not pale, not dry and not cyanotic. She does not have dentures. Oral lesions present. Abnormal dentition. Dental caries present. No dental abscesses, edematous or lacerations. No oropharyngeal exudate, posterior oropharyngeal edema, posterior oropharyngeal erythema or tonsillar abscesses.    3mm Oral  lesion on front upper gingiva between left central and lateral incisor.  TTP.  Non-draining. Bottom left last molar TTP, severe dental cary and partial missing tooth.  Non-draining, without abscess.   Eyes: EOM are normal. Pupils are equal, round, and reactive to light. Right eye exhibits no discharge. Left eye exhibits no discharge.  Neck: Normal range of motion. Neck supple. No tracheal deviation present.  No nuchal rigidity no meningeal signs  Cardiovascular: Normal rate and regular rhythm.   Pulmonary/Chest: Effort normal and breath sounds normal. No stridor. No respiratory distress. She has no wheezes. She has no rales.  Abdominal: Soft. She exhibits no distension and no mass. There is no tenderness. There is no rebound and no guarding.  Musculoskeletal: Normal range of motion. She exhibits no edema and no tenderness.  Neurological: She is alert and oriented to person, place, and time. She has normal reflexes. No cranial nerve deficit. Coordination normal.  Skin: Skin is warm. No rash noted. She is not diaphoretic. No erythema. No pallor.  No pettechia no purpura    ED Course  Procedures (including critical care time)  DIAGNOSTIC STUDIES: Oxygen Saturation is 100% on room air, normal by my interpretation.    COORDINATION OF CARE:  7:44PM - vicodin and ibuprofen will be prescribed for Tanya Carroll. She is advised to f/u with her dentist. She is ready for d/c.   Labs Reviewed - No data to display No results found.   1. Pain, dental       MDM  Pt c/o gum swelling associated with dental pain. Pt states she was seen last week in the ED for a dental abscess. Was Rx: PCN and Vicodin which did relieve the pain but woke up this morning with gum swelling.  She is scheduled for tooth extraction on Tuesday but was concerned the abscess was returning due to gum swelling.  Admitted to dropping phone on face yesterday causing frontal gum pain and swelling.  Pain with eating.  Denies  fever, n/v/d, or difficulty breathing.  No abscess visible at this time where pt said abscess was originally.  Severe dental carry, partial tooth fragment missing from lower left back molar w/o drainage.  Will have dentist on Tuesday rx additional antibiotics as needed.    Will prescribed Norco and ibuprofen until pt able to dentist.  Gave referral to Dr. Mayford Knife, DDS, in case pt unable to keep appointment on Tuesday.    Vitals: unremarkable. Discharged in stable condition.    Discussed pt with attending during ED encounter.   I personally performed the services described in this documentation, which was scribed in my presence. The recorded information has been reviewed and is accurate.        Junius Finner, PA-C 05/15/12 2015

## 2012-05-16 NOTE — ED Provider Notes (Signed)
Medical screening examination/treatment/procedure(s) were performed by non-physician practitioner and as supervising physician I was immediately available for consultation/collaboration.   Suzi Roots, MD 05/16/12 [phone]

## 2013-11-29 ENCOUNTER — Encounter (HOSPITAL_COMMUNITY): Payer: Self-pay | Admitting: *Deleted

## 2015-02-16 ENCOUNTER — Emergency Department (HOSPITAL_COMMUNITY)
Admission: EM | Admit: 2015-02-16 | Discharge: 2015-02-16 | Disposition: A | Discharge status: Home / Self Care | Payer: Medicaid Other | Attending: Emergency Medicine | Admitting: Emergency Medicine

## 2015-02-16 ENCOUNTER — Encounter (HOSPITAL_COMMUNITY): Payer: Self-pay | Admitting: *Deleted

## 2015-02-16 DIAGNOSIS — Z792 Long term (current) use of antibiotics: Secondary | ICD-10-CM | POA: Diagnosis not present

## 2015-02-16 DIAGNOSIS — Z79899 Other long term (current) drug therapy: Secondary | ICD-10-CM | POA: Insufficient documentation

## 2015-02-16 DIAGNOSIS — R21 Rash and other nonspecific skin eruption: Secondary | ICD-10-CM | POA: Insufficient documentation

## 2015-02-16 MED ORDER — PREDNISONE 20 MG PO TABS: 40.0000 mg | ORAL_TABLET | Freq: Every day | ORAL | Status: DC

## 2015-02-16 MED ORDER — DIPHENHYDRAMINE HCL 25 MG PO TABS: 25.0000 mg | ORAL_TABLET | Freq: Four times a day (QID) | ORAL | Status: DC | PRN

## 2015-02-16 NOTE — ED Provider Notes (Signed)
CSN: [id number]     Arrival date & time 02/16/15  2013 History  By signing my name below, I, Soijett Blue, attest that this documentation has been prepared under the direction and in the presence of Roxy Horseman, PA-C Electronically Signed: Soijett Blue, ED Scribe. 02/16/2015. 10:23 PM.   Chief Complaint  Patient presents with  . Rash      The history is provided by the patient. No language interpreter was used.    Tanya Carroll is a 30 y.o. female who presents to the Emergency Department complaining of rash onset yesterday worsening this morning. She reports that the rash is to her upper chest, under her bilateral breast, bilateral armpits, back, and inner bilateral thighs. She notes that there was a stinging sensation initially and sensitivity to the area hat has resolved. She reports that yesterday she began a detox of lemon water and that was prior to the onset of her symptoms. Pt denies new soaps/medications/pets/environment/lotion/detergent. She notes that she has not tried any medications for the relief of her symptoms. She denies SOB, rhinorrhea, cough, and any other symptoms.    Past Medical History  Diagnosis Date  . Placenta previa    History reviewed. No pertinent past surgical history. Family History  Problem Relation Age of Onset  . Hypertension Mother   . Hypertension Sister    Social History  Substance Use Topics  . Smoking status: Never Smoker   . Smokeless tobacco: Never Used  . Alcohol Use: 1.2 oz/week    2 Shots of liquor per week     Comment: Once or twice a week.   OB History    Gravida Para Term Preterm AB TAB SAB Ectopic Multiple Living   Review of Systems  Constitutional: Negative for fever.  HENT: Negative for rhinorrhea.   Respiratory: Negative for cough and shortness of breath.   Skin: Positive for rash.  All other systems reviewed and are negative.     Allergies  Review of patient's allergies indicates no known  allergies.  Home Medications   Prior to Admission medications   Medication Sig Start Date End Date Taking? Authorizing Provider  diphenhydrAMINE (BENADRYL) 25 MG tablet Take 1 tablet (25 mg total) by mouth every 6 (six) hours as needed for itching (Rash). 02/16/15   Roxy Horseman, PA-C  HYDROcodone-acetaminophen (NORCO/VICODIN) 5-325 MG per tablet Take 1-2 tablets every 6 hours as needed for severe pain 05/02/12   Renne Crigler, PA-C  HYDROcodone-acetaminophen (NORCO/VICODIN) 5-325 MG per tablet Take 1-2 tablets every six hours as needed for pain. 05/15/12   Junius Finner, PA-C  ibuprofen (ADVIL,MOTRIN) 600 MG tablet Take 1 tablet (600 mg total) by mouth every 6 (six) hours as needed for pain. 05/15/12   Junius Finner, PA-C  penicillin v potassium (VEETID) 500 MG tablet Take 1 tablet (500 mg total) by mouth 3 (three) times daily. 05/02/12   Renne Crigler, PA-C  predniSONE (DELTASONE) 20 MG tablet Take 2 tablets (40 mg total) by mouth daily. 02/16/15   Roxy Horseman, PA-C   BP 151/107 mmHg  Pulse 87  Temp(Src) 97.9 F (36.6 C) (Oral)  Resp 18  SpO2 100% Physical Exam  Constitutional: She is oriented to person, place, and time. She appears well-developed and well-nourished. No distress.  HENT:  Head: Normocephalic and atraumatic.  Eyes: EOM are normal.  Neck: Neck supple.  Cardiovascular: Normal rate.   Pulmonary/Chest: Effort normal. No respiratory distress.  Scribe chaperone present for exam  Musculoskeletal: Normal range of motion.  Neurological: She is alert and oriented to person, place, and time.  Skin: Skin is warm and dry. Rash noted. Rash is papular. No erythema.  Sparse scattered papules non-tender. No discharge or drainage, or erythema.  Psychiatric: She has a normal mood and affect. Her behavior is normal.  Nursing note and vitals reviewed.   ED Course  Procedures (including critical care time) DIAGNOSTIC STUDIES: Oxygen Saturation is 100% on RA, nl by my interpretation.     COORDINATION OF CARE: 10:20 PM Discussed treatment plan with pt at bedside which includes referral to dermatologist, prednisone Rx, benadryl PRN and pt agreed to plan.     MDM   Final diagnoses:  Rash    Patient with mild rash of unknown etiology. Vital signs are stable. No evidence of infection. Treatment as above. Recommend dermatologic follow-up. Advised patient to stop with the lemon water detox.  I personally performed the services described in this documentation, which was scribed in my presence. The recorded information has been reviewed and is accurate.      Roxy Horseman, PA-C 02/16/15 2227  Eber Hong, MD 02/17/15 2329

## 2015-02-16 NOTE — Discharge Instructions (Signed)

## 2015-02-16 NOTE — ED Notes (Addendum)
Pt complains of rash to upper chest, under her breast, armpits and inner thigh since yesterday. Pt states the rash stings and feels sensitive to touch. Pt denies using any different lotions, soaps, laundry detergents, medications. Pt states she has not tried OTC medication.

## 2015-02-16 NOTE — ED Notes (Signed)
First call. No answer.

## 2015-03-28 ENCOUNTER — Encounter (HOSPITAL_COMMUNITY): Payer: Self-pay | Admitting: Emergency Medicine

## 2015-03-28 ENCOUNTER — Emergency Department (HOSPITAL_COMMUNITY)
Admission: EM | Admit: 2015-03-28 | Discharge: 2015-03-28 | Disposition: A | Discharge status: Home / Self Care | Payer: Medicaid Other | Attending: Emergency Medicine | Admitting: Emergency Medicine

## 2015-03-28 DIAGNOSIS — Z7952 Long term (current) use of systemic steroids: Secondary | ICD-10-CM | POA: Diagnosis not present

## 2015-03-28 DIAGNOSIS — J069 Acute upper respiratory infection, unspecified: Secondary | ICD-10-CM | POA: Diagnosis not present

## 2015-03-28 DIAGNOSIS — I10 Essential (primary) hypertension: Secondary | ICD-10-CM | POA: Diagnosis not present

## 2015-03-28 DIAGNOSIS — Z7951 Long term (current) use of inhaled steroids: Secondary | ICD-10-CM | POA: Diagnosis not present

## 2015-03-28 DIAGNOSIS — Z792 Long term (current) use of antibiotics: Secondary | ICD-10-CM | POA: Insufficient documentation

## 2015-03-28 DIAGNOSIS — B9789 Other viral agents as the cause of diseases classified elsewhere: Secondary | ICD-10-CM

## 2015-03-28 DIAGNOSIS — J029 Acute pharyngitis, unspecified: Secondary | ICD-10-CM | POA: Diagnosis present

## 2015-03-28 LAB — RAPID STREP SCREEN (MED CTR MEBANE ONLY): STREPTOCOCCUS, GROUP A SCREEN (DIRECT): NEGATIVE

## 2015-03-28 MED ORDER — BENZONATATE 100 MG PO CAPS: 100.0000 mg | ORAL_CAPSULE | Freq: Three times a day (TID) | ORAL | Status: DC

## 2015-03-28 MED ORDER — FLUTICASONE PROPIONATE 50 MCG/ACT NA SUSP: 2.0000 | Freq: Every day | NASAL | Status: DC

## 2015-03-28 NOTE — Progress Notes (Signed)
Entered in d/c instructions  Francoise Ceo A Schedule an appointment as soon as possible for a visit As needed This is the medicaid assigned pcp for you If you prefer another contact DSS at [phone] to get a list of accepting docotrs to choose another Dr Gaynell Face is listed as an OB GYN [address] [place] [address] [phone] medicaid coverage [email or web address] You have access to medicaid transportation by call DSs [phone] It is your responsibiilty to contact DSS whenever you change addresses from county to county or state to state Please access the website for medicaid to assist with renewal of application

## 2015-03-28 NOTE — ED Notes (Signed)
Pt c/o sore throat, body aches, cough, fever since yesterday.

## 2015-03-28 NOTE — Discharge Instructions (Signed)
Please read attached information. If you experience any new or worsening signs or symptoms please return to the emergency room for evaluation. Please follow-up with your primary care provider or specialist as discussed. Please use medication prescribed only as directed and discontinue taking if you have any concerning signs or symptoms.   °

## 2015-03-28 NOTE — ED Provider Notes (Signed)
CSN: [id number]     Arrival date & time 03/28/15  1430 History  By signing my name below, I, Bethel Born, attest that this documentation has been prepared under the direction and in the presence of [employer]. Electronically Signed: Bethel Born, ED Scribe. 03/28/2015 4:48 PM   Chief Complaint  Patient presents with  . Sore Throat  . Generalized Body Aches  . Cough   The history is provided by the patient. No language interpreter was used.   Tanya Carroll is a 30 y.o. female who presents to the Emergency Department complaining of a new, constant, 8/10 in severity, right-sided sore throat with onset yesterday. Associated symptoms include subjective fever, chills, body aches, rhinorrhea, cough, and SOB after coughing. Pt denies chest pain, abdominal pain, nausea, vomiting, diarrhea, and rash. No know sick contact. She is otherwise healthy and does not smoke.   Past Medical History  Diagnosis Date  . Placenta previa    No past surgical history on file. Family History  Problem Relation Age of Onset  . Hypertension Mother   . Hypertension Sister    Social History  Substance Use Topics  . Smoking status: Never Smoker   . Smokeless tobacco: Never Used  . Alcohol Use: 1.2 oz/week    2 Shots of liquor per week     Comment: Once or twice a week.   OB History    Gravida Para Term Preterm AB TAB SAB Ectopic Multiple Living   Review of Systems  All other systems reviewed and are negative.   Allergies  Review of patient's allergies indicates no known allergies.  Home Medications   Prior to Admission medications   Medication Sig Start Date End Date Taking? Authorizing Provider  benzonatate (TESSALON) 100 MG capsule Take 1 capsule (100 mg total) by mouth every 8 (eight) hours. 03/28/15   Eyvonne Mechanic, PA-C  diphenhydrAMINE (BENADRYL) 25 MG tablet Take 1 tablet (25 mg total) by mouth every 6 (six) hours as needed for itching (Rash). 02/16/15    Roxy Horseman, PA-C  fluticasone (FLONASE) 50 MCG/ACT nasal spray Place 2 sprays into both nostrils daily. 03/28/15   Eyvonne Mechanic, PA-C  HYDROcodone-acetaminophen (NORCO/VICODIN) 5-325 MG per tablet Take 1-2 tablets every 6 hours as needed for severe pain 05/02/12   Renne Crigler, PA-C  HYDROcodone-acetaminophen (NORCO/VICODIN) 5-325 MG per tablet Take 1-2 tablets every six hours as needed for pain. 05/15/12   Junius Finner, PA-C  ibuprofen (ADVIL,MOTRIN) 600 MG tablet Take 1 tablet (600 mg total) by mouth every 6 (six) hours as needed for pain. 05/15/12   Junius Finner, PA-C  penicillin v potassium (VEETID) 500 MG tablet Take 1 tablet (500 mg total) by mouth 3 (three) times daily. 05/02/12   Renne Crigler, PA-C  predniSONE (DELTASONE) 20 MG tablet Take 2 tablets (40 mg total) by mouth daily. 02/16/15   Roxy Horseman, PA-C   BP 161/103 mmHg  Pulse 100  Temp(Src) 99.3 F (37.4 C) (Oral)  Resp 18  SpO2 100%  Physical Exam  Constitutional: She is oriented to person, place, and time. She appears well-developed and well-nourished. No distress.  HENT:  Head: Normocephalic and atraumatic.  Right Ear: Hearing, tympanic membrane and external ear normal.  Left Ear: Hearing and external ear normal.  Nose: Rhinorrhea present.  Mouth/Throat: Uvula is midline, oropharynx is clear and moist and mucous membranes are normal. No oropharyngeal exudate, posterior oropharyngeal edema, posterior oropharyngeal  erythema or tonsillar abscesses.  Eyes: Conjunctivae and EOM are normal.  Neck: Neck supple. No tracheal deviation present.  Cardiovascular: Normal rate and regular rhythm.   Pulmonary/Chest: Effort normal and breath sounds normal. No respiratory distress.  Musculoskeletal: Normal range of motion.  Neurological: She is alert and oriented to person, place, and time.  Skin: Skin is warm and dry.  Psychiatric: She has a normal mood and affect. Her behavior is normal.  Nursing note and vitals  reviewed.   ED Course  Procedures (including critical care time) DIAGNOSTIC STUDIES: Oxygen Saturation is 100% on RA,  normal by my interpretation.    COORDINATION OF CARE: 4:46 PM Discussed treatment plan which includes lab work with pt at bedside and pt agreed to plan.  Labs Review Labs Reviewed  RAPID STREP SCREEN (NOT AT [facility])  CULTURE, GROUP A STREP [facility])    Imaging Review No results found. I have personally reviewed and evaluated these  lab results as part of my medical decision-making.   EKG Interpretation None      MDM   Final diagnoses:  Viral URI with cough  Essential hypertension   Labs: Rapid strep negative  Imaging:  Consults:  Therapeutics:  Discharge Meds: Flonase, benzonatate  Assessment/Plan: Pt presents with likely viral uri . No difficulty swallowing, drooling, dysphonia,muffled voice, stridor, swelling of the neck, trismus, mouth pain, swelling/ pain in submandibular area or floor of mouth ,assymetry of tonsils, or ulcerations; unlikely epiglottitis, PTA, submandibular space infection, retropharyngeal space infection, or HIV. Pt treated here in the ED with therapeutics listed above, given strict return precautions, PCP follow-up for re-evaluation if symptoms persist beyond 5-7 days in duration, return to the ED if they worsen. Pt verbalized understanding and agreement to today's plan and had no further questions or concerns at the time of discharge.     I personally performed the services described in this documentation, which was scribed in my presence. The recorded information has been reviewed and is accurate.   Eyvonne Mechanic, PA-C 03/28/15 1700  Arby Barrette, MD 03/29/15 2046

## 2015-03-31 LAB — CULTURE, GROUP A STREP (THRC)

## 2015-08-25 ENCOUNTER — Encounter (HOSPITAL_COMMUNITY): Payer: Self-pay | Admitting: Emergency Medicine

## 2015-08-25 ENCOUNTER — Ambulatory Visit (HOSPITAL_COMMUNITY)
Admission: EM | Admit: 2015-08-25 | Discharge: 2015-08-25 | Disposition: A | Discharge status: Home / Self Care | Payer: Self-pay | Attending: Emergency Medicine | Admitting: Emergency Medicine

## 2015-08-25 DIAGNOSIS — I159 Secondary hypertension, unspecified: Secondary | ICD-10-CM

## 2015-08-25 LAB — POCT I-STAT, CHEM 8
BUN: 3 mg/dL — AB (ref 6–20)
CHLORIDE: 103 mmol/L (ref 101–111)
CREATININE: 0.7 mg/dL (ref 0.44–1.00)
Calcium, Ion: 1.18 mmol/L (ref 1.13–1.30)
Glucose, Bld: 103 mg/dL — ABNORMAL HIGH (ref 65–99)
HEMATOCRIT: 41 % (ref 36.0–46.0)
Hemoglobin: 13.9 g/dL (ref 12.0–15.0)
POTASSIUM: 3.7 mmol/L (ref 3.5–5.1)
SODIUM: 142 mmol/L (ref 135–145)
TCO2: 26 mmol/L (ref 0–100)

## 2015-08-25 MED ORDER — HYDROCHLOROTHIAZIDE 25 MG PO TABS: 25.0000 mg | ORAL_TABLET | Freq: Every day | ORAL | 0 refills | Status: DC

## 2015-08-25 NOTE — ED Triage Notes (Signed)
C/o bp States she does not have a pcp States she is a new hire for [employer] and needs to pass assessment to be hired.   No salt has been her treatment

## 2015-08-25 NOTE — Discharge Instructions (Signed)
Decrease your salt intake. diet and exercise will lower your blood pressure significantly. It is important to keep your blood pressure under good control, as having a elevated for prolonged periods of time significantly increases your risk of stroke, heart attacks, kidney damage, eye damage, and other problems. Keep a log of your blood pressure and bring it to your next appointment Return here in a week for blood pressure recheck if you're able to find a primary care physician by then. Return immediately to the ER if you start having chest pain, headache, problems seeing, problems talking, problems walking, if you feel like you're about to pass out, if you do pass out, if you have a seizure, or for any other concerns.[facility]Internal Medicine [phone] [address] [name] [address]  [facility]: [address] [place] [address]  [phone]  [facility]: [address] [place] [address]   [phone]  [facility]: [address] [place] [address]  [phone]   primary care : [address] [place] [address] [phone]  [facility]: [address] [place] [address] [phone]  [facility]: [address] [place] [address] [phone]  Dr. Oneal Grout [address] [facility] [place] [address]  [phone]  Dr. Jackie Plum, [facility]. [address] [place], [place] [address]  [phone]

## 2015-08-25 NOTE — ED Provider Notes (Signed)
HPI  SUBJECTIVE:  Tanya Carroll is a 30 y.o. female who presents for evaluation of hypertension recently diagnosed 3 weeks ago at a  screening/occupational health. She states that his been elevated each time and has been measured at 3 separate times. She states that her blood pressure been ranging from 160s to 170s over 100s to 120s. States is sent here by occupational health for evaluation and to be started on a medication. She reports mild intermittent throbbing gradual onset left sided headaches that last minutes to hours for the past 3 weeks. She states they usually get better with 200 mg ibuprofen and increasing fluids. There are no aggravating factors. She states this is not the worst headache of her life. She denies nausea, vomiting, visual changes, dysarthria, discoordination, facial weakness, arm or leg weakness, chest pain, pain tearing through to her back, abdominal pain. No presyncope, syncope, seizures. No lower extremity edema, hematuria, anuria. Patient is not taking any herbal supplements she is not using any OTC decongestants, no illicit substances most specifically cocaine. She has no history of diabetes, stroke, coronary artery disease, kidney disease.    Past Medical History:  Diagnosis Date  . Placenta previa     History reviewed. No pertinent surgical history.  Family History  Problem Relation Age of Onset  . Hypertension Mother   . Hypertension Sister     Social History  Substance Use Topics  . Smoking status: Never Smoker  . Smokeless tobacco: Never Used  . Alcohol use 1.2 oz/week    2 Shots of liquor per week     Comment: Once or twice a week.    No current facility-administered medications for this encounter.   Current Outpatient Prescriptions:  .  fluticasone (FLONASE) 50 MCG/ACT nasal spray, Place 2 sprays into both nostrils daily., Disp: 9.9 g, Rfl: 2 .  hydrochlorothiazide (HYDRODIURIL) 25 MG tablet, Take 1 tablet (25 mg total) by  mouth daily., Disp: 30 tablet, Rfl: 0 .  ibuprofen (ADVIL,MOTRIN) 600 MG tablet, Take 1 tablet (600 mg total) by mouth every 6 (six) hours as needed for pain., Disp: 30 tablet, Rfl: 0  No Known Allergies   ROS  As noted in HPI.   Physical Exam  BP 152/92 (BP Location: Left Arm)   Pulse 93   Temp 97.9 F (36.6 C) (Oral)   Resp 16   SpO2 99%    BP Readings from Last 3 Encounters:  08/25/15 152/92  03/28/15 142/99  02/16/15 (!) 151/107    Constitutional: Well developed, well nourished, no acute distress Eyes: PERRL, EOMI, conjunctiva normal bilaterally HENT: Normocephalic, atraumatic,mucus membranes moist Respiratory: Clear to auscultation bilaterally, no rales, no wheezing, no rhonchi Cardiovascular: Normal rate and rhythm, no murmurs, no gallops, no rubs GI: nondistended skin: No rash, skin intact Musculoskeletal: Trace lower extremity edema bilaterally calves symmetric and nontender, no tenderness, no deformities Neurologic: Alert & oriented x 3, CN II-XII intact, no motor deficits, sensation grossly intact Psychiatric: Speech and behavior appropriate   ED Course   Medications - No data to display  Orders Placed This Encounter  Procedures  . I-STAT, chem 8    Standing Status:   Standing    Number of Occurrences:   1   Results for orders placed or performed during the hospital encounter of 08/25/15 (from the past 24 hour(s))  I-STAT, chem 8     Status: Abnormal   Collection Time: 08/25/15 12:12 PM  Result Value Ref Range   Sodium 142 135 -  145 mmol/L   Potassium 3.7 3.5 - 5.1 mmol/L   Chloride 103 101 - 111 mmol/L   BUN 3 (L) 6 - 20 mg/dL   Creatinine, Ser 1.61 0.44 - 1.00 mg/dL   Glucose, Bld 096 (H) 65 - 99 mg/dL   Calcium, Ion 0.45 4.09 - 1.30 mmol/L   TCO2 26 0 - 100 mmol/L   Hemoglobin 13.9 12.0 - 15.0 g/dL   HCT 81.1 91.4 - 78.2 %   No results found.  ED Clinical Impression  Secondary hypertension, unspecified   ED Assessment/Plan  Pt  hypertensive today. States BP has been running in this range recently. Pt has no evidence of end organ damage. No red flags as noted in H&P.Kidney function, electrolytes acceptable. Discussed importance of lifestyle modifications as important first steps, and will start on hydrochlorothiazide 25  mg daily. Will provide primary care referral for follow-up. Advised her to  To keep a log of her blood pressure  Intake this with her to her next appointment.  Discussed labs, MDM, plan and followup with patient. Discussed sn/sx that should prompt return to the ED. Patient agrees with plan.   *This clinic note was created using Dragon dictation software. Therefore, there may be occasional mistakes despite careful proofreading.  ?   Domenick Gong, MD 08/25/15 1245

## 2018-10-30 ENCOUNTER — Encounter: Payer: Self-pay | Admitting: Podiatry

## 2018-10-30 ENCOUNTER — Ambulatory Visit (INDEPENDENT_AMBULATORY_CARE_PROVIDER_SITE_OTHER): Payer: BC Managed Care – PPO

## 2018-10-30 ENCOUNTER — Other Ambulatory Visit: Payer: Self-pay | Admitting: Podiatry

## 2018-10-30 ENCOUNTER — Other Ambulatory Visit: Payer: Self-pay

## 2018-10-30 ENCOUNTER — Ambulatory Visit: Payer: BC Managed Care – PPO | Admitting: Podiatry

## 2018-10-30 VITALS — BP 152/105

## 2018-10-30 DIAGNOSIS — G5760 Lesion of plantar nerve, unspecified lower limb: Secondary | ICD-10-CM

## 2018-10-30 DIAGNOSIS — G5761 Lesion of plantar nerve, right lower limb: Secondary | ICD-10-CM

## 2018-10-30 DIAGNOSIS — G5762 Lesion of plantar nerve, left lower limb: Secondary | ICD-10-CM

## 2018-10-30 DIAGNOSIS — M79671 Pain in right foot: Secondary | ICD-10-CM | POA: Diagnosis not present

## 2018-10-30 DIAGNOSIS — M79672 Pain in left foot: Secondary | ICD-10-CM | POA: Diagnosis not present

## 2018-10-30 NOTE — Progress Notes (Signed)
Subjective:  Patient ID: Tanya Carroll, female    DOB: [birth year],  MRN: [id number]  Chief Complaint  Patient presents with  . Foot Pain    pt is here for possible foot tingling of both of the second toes, pt states that this has been going on for about 3-4 months,     33 y.o. female presents with the above complaint.  Patient states that she has not done anything that to help alleviate the pain.  The pain started in July and has gotten worse since then.  Patient has tried stretching as well as Epsom salts soaking but has not helped much.  She states that is tingling and numbness in the toes especially on the second digit.   Review of Systems: Negative except as noted in the HPI. Denies N/V/F/Ch.  Past Medical History:  Diagnosis Date  . Placenta previa     Current Outpatient Medications:  .  fluticasone (FLONASE) 50 MCG/ACT nasal spray, Place 2 sprays into both nostrils daily., Disp: 9.9 g, Rfl: 2 .  hydrochlorothiazide (HYDRODIURIL) 25 MG tablet, Take 1 tablet (25 mg total) by mouth daily., Disp: 30 tablet, Rfl: 0 .  ibuprofen (ADVIL,MOTRIN) 600 MG tablet, Take 1 tablet (600 mg total) by mouth every 6 (six) hours as needed for pain., Disp: 30 tablet, Rfl: 0  Social History   Tobacco Use  Smoking Status Never Smoker  Smokeless Tobacco Never Used    No Known Allergies Objective:   Vitals:   10/30/18 1614  BP: (!) 152/105   There is no height or weight on file to calculate BMI. Constitutional Well developed. Well nourished.  Vascular Dorsalis pedis pulses palpable bilaterally. Posterior tibial pulses palpable bilaterally. Capillary refill normal to all digits.  No cyanosis or clubbing noted. Pedal hair growth normal.  Neurologic Normal speech. Oriented to person, place, and time. Epicritic sensation to light touch grossly present bilaterally. Pain on percussion of the dorsal second interspace bilaterally.  Positive Mulder sign and lateral squeeze test bilaterally  of the second interspace.  The tingling and numbness extends to the second digit bilaterally  Dermatologic Nails well groomed and normal in appearance. No open wounds. No skin lesions.  Orthopedic:  Pain on palpation to the second digit.   Radiographs: 2 views of skeletally mature adult bilateral feet.  No bony deformity noted.  No arthritic changes to any of the joint noted.  Mild posterior spurring noted to bilateral calcaneus. Assessment:   1. Pain in both feet   2. Morton's neuroma of second interspace of right foot   3. Morton's neuroma of second interspace of left foot    Plan:  Patient was evaluated and treated and all questions answered.  Neuroma of the second interspace bilaterally -Given the clinical findings of Mulder click with positive lateral squeeze test it was determined that patient was likely experiencing symptoms secondary to neuroma. -At this time it was determined that patient will benefit from a nerve block of the neuroma bilaterally. -After obtaining consent, and per orders of Dr. Lennette Bihari Merrick Feutz, injection of the 1% and 0.5% lidocaine given by Felipa Furnace. Patient instructed to remain in clinic for 20 minutes afterwards, and to report any adverse reaction to me immediately. -If patient symptoms resolve then it is conclusive that symptoms are consistent with neuromas of the second interspace bilaterally.  We will attempt to place her in orthotics and during her next visit if her symptoms have not improved.  Return in about 3 weeks (around 11/20/2018).

## 2018-11-20 ENCOUNTER — Other Ambulatory Visit: Payer: Self-pay

## 2018-11-20 ENCOUNTER — Ambulatory Visit: Payer: BC Managed Care – PPO | Admitting: Podiatry

## 2018-11-20 DIAGNOSIS — G5762 Lesion of plantar nerve, left lower limb: Secondary | ICD-10-CM

## 2018-11-20 DIAGNOSIS — M79672 Pain in left foot: Secondary | ICD-10-CM

## 2018-11-20 DIAGNOSIS — M79671 Pain in right foot: Secondary | ICD-10-CM | POA: Diagnosis not present

## 2018-11-20 DIAGNOSIS — G5761 Lesion of plantar nerve, right lower limb: Secondary | ICD-10-CM

## 2018-11-23 ENCOUNTER — Encounter: Payer: Self-pay | Admitting: Podiatry

## 2018-11-23 NOTE — Progress Notes (Signed)
Subjective:  Patient ID: Tanya Carroll, female    DOB: February 28, 1985,  MRN: [id number]  Chief Complaint  Patient presents with  . Neuroma    Pt states still having symptoms, injections not effective.    33 y.o. female presents with the above complaint.  Patient states that the injections were not as effective as she thought it would be.  However I explained to her that that if the injection helped even for a day or 2 that was the job of the injection to identify and diagnosed for neuroma.  She states it did help for 2 days before the pain started coming back.  During those 2 days she had complete relief of pain.  She has been ambulating in regular sneakers.  She denies any other acute complaints.   Review of Systems: Negative except as noted in the HPI. Denies N/V/F/Ch.  Past Medical History:  Diagnosis Date  . Placenta previa     Current Outpatient Medications:  .  fluticasone (FLONASE) 50 MCG/ACT nasal spray, Place 2 sprays into both nostrils daily. (Patient not taking: Reported on 11/20/2018), Disp: 9.9 g, Rfl: 2 .  hydrochlorothiazide (HYDRODIURIL) 25 MG tablet, Take 1 tablet (25 mg total) by mouth daily. (Patient not taking: Reported on 11/20/2018), Disp: 30 tablet, Rfl: 0 .  ibuprofen (ADVIL,MOTRIN) 600 MG tablet, Take 1 tablet (600 mg total) by mouth every 6 (six) hours as needed for pain. (Patient not taking: Reported on 11/20/2018), Disp: 30 tablet, Rfl: 0  Social History   Tobacco Use  Smoking Status Never Smoker  Smokeless Tobacco Never Used    No Known Allergies Objective:  There were no vitals filed for this visit. There is no height or weight on file to calculate BMI. Constitutional Well developed. Well nourished.  Vascular Dorsalis pedis pulses palpable bilaterally. Posterior tibial pulses palpable bilaterally. Capillary refill normal to all digits.  No cyanosis or clubbing noted. Pedal hair growth normal.  Neurologic Normal speech. Oriented to person,  place, and time. Epicritic sensation to light touch grossly present bilaterally Epicritic sensation to light touch grossly present bilaterally. Pain on percussion of the dorsal second interspace bilaterally.  Positive Mulder sign and lateral squeeze test bilaterally of the second interspace.  The tingling and numbness extends to the second digit bilaterally.  Dermatologic Nails well groomed and normal in appearance. No open wounds. No skin lesions.  Orthopedic: Pain on palpation to the second digit.   Radiographs: None Assessment:   1. Pain in both feet   2. Morton's neuroma of second interspace of right foot   3. Morton's neuroma of second interspace of left foot    Plan:  Patient was evaluated and treated and all questions answered.  Bilateral second interspace Morton's neuroma -Given that the nerve block helped for couple of days.  I am highly confident that this pain is due to neuromas. -I explained to the patient the various treatment strategies to help alleviate the pain from neuromas.  I explained to her that there is surgical excision, 4% sclerosing alcohol injections, and orthotics management. -Patient has opted for alcohol injections to bilateral second interspace.  I explained to her that it would take about 6-7 sessions to help control the neuromas.  I made no guarantees as to the outcomes of these procedures. -I have discussed orthotics management.  After obtaining consent, and per orders of Dr. Nicholes Rough, injection of 4% alcohol sclerosing to both second interspace neuromas bilaterally given by Candelaria Stagers. Patient instructed to remain  in clinic for 20 minutes afterwards, and to report any adverse reaction to me immediately.   No follow-ups on file.

## 2018-12-11 ENCOUNTER — Ambulatory Visit: Payer: BC Managed Care – PPO | Admitting: Podiatry

## 2018-12-21 ENCOUNTER — Ambulatory Visit: Payer: BC Managed Care – PPO | Admitting: Podiatry

## 2019-06-17 ENCOUNTER — Ambulatory Visit (INDEPENDENT_AMBULATORY_CARE_PROVIDER_SITE_OTHER): Payer: Self-pay | Admitting: Primary Care

## 2019-07-01 ENCOUNTER — Encounter (INDEPENDENT_AMBULATORY_CARE_PROVIDER_SITE_OTHER): Payer: Self-pay | Admitting: Primary Care

## 2019-07-01 ENCOUNTER — Ambulatory Visit (INDEPENDENT_AMBULATORY_CARE_PROVIDER_SITE_OTHER): Payer: BC Managed Care – PPO | Admitting: Primary Care

## 2019-07-01 ENCOUNTER — Other Ambulatory Visit: Payer: Self-pay

## 2019-07-01 VITALS — BP 157/111 | HR 88 | Temp 98.0°F | Ht 63.0 in | Wt 179.6 lb

## 2019-07-01 DIAGNOSIS — Z7689 Persons encountering health services in other specified circumstances: Secondary | ICD-10-CM | POA: Diagnosis not present

## 2019-07-01 DIAGNOSIS — I10 Essential (primary) hypertension: Secondary | ICD-10-CM

## 2019-07-01 DIAGNOSIS — R634 Abnormal weight loss: Secondary | ICD-10-CM

## 2019-07-01 DIAGNOSIS — Z6831 Body mass index (BMI) 31.0-31.9, adult: Secondary | ICD-10-CM

## 2019-07-01 DIAGNOSIS — E6609 Other obesity due to excess calories: Secondary | ICD-10-CM

## 2019-07-01 DIAGNOSIS — Z131 Encounter for screening for diabetes mellitus: Secondary | ICD-10-CM

## 2019-07-01 LAB — POCT GLYCOSYLATED HEMOGLOBIN (HGB A1C): Hemoglobin A1C: 5.5 % (ref 4.0–5.6)

## 2019-07-01 MED ORDER — AMLODIPINE BESYLATE 10 MG PO TABS: 10.0000 mg | ORAL_TABLET | Freq: Every day | ORAL | 3 refills | Status: DC

## 2019-07-01 MED ORDER — CLONIDINE HCL 0.1 MG PO TABS
0.2000 mg | ORAL_TABLET | Freq: Once | ORAL | Status: AC
  Administered 2019-07-01: 0.2 mg via ORAL

## 2019-07-01 MED ORDER — LOSARTAN POTASSIUM-HCTZ 100-25 MG PO TABS: 1.0000 | ORAL_TABLET | Freq: Every day | ORAL | 3 refills | Status: DC

## 2019-07-01 NOTE — Patient Instructions (Signed)

## 2019-07-01 NOTE — Progress Notes (Signed)
Pt has numbness and pain from both knees down  Numbness in fingers  Edema of both feet

## 2019-07-01 NOTE — Progress Notes (Signed)
New Patient Office Visit  Subjective:  Patient ID: Tanya Carroll, female    DOB: 07-Apr-1985  Age: 34 y.o. MRN: [id number]  CC:  Chief Complaint  Patient presents with  . New Patient (Initial Visit)    HPI Ms. Tanya Carroll is a 34 year old obese female with a known history of hypertension mostly started experiencing headaches and weight loss. She denies shortness of breath and chest pain . She does endorses bilateral lower extremity edema and headaches. She present today to establish care and presents with a bp of 179/125 treated with clonidine .2mg  will recheck Bp.  Past Medical History:  Diagnosis Date  . Placenta previa     History reviewed. No pertinent surgical history.  Family History  Problem Relation Age of Onset  . Hypertension Mother   . Hypertension Sister     Social History   Socioeconomic History  . Marital status: Single    Spouse name: Not on file  . Number of children: Not on file  . Years of education: Not on file  . Highest education level: Not on file  Occupational History  . Not on file  Tobacco Use  . Smoking status: Never Smoker  . Smokeless tobacco: Never Used  Substance and Sexual Activity  . Alcohol use: Yes    Alcohol/week: 2.0 standard drinks    Types: 2 Shots of liquor per week    Comment: Once or twice a week.  . Drug use: No  . Sexual activity: Yes    Birth control/protection: Injection  Other Topics Concern  . Not on file  Social History Narrative  . Not on file   Social Determinants of Health   Financial Resource Strain:   . Difficulty of Paying Living Expenses:   Food Insecurity:   . Worried About in the Last Year:   . Programme researcher, broadcasting/film/video in the Last Year:   Transportation Needs:   . Barista (Medical):   Freight forwarder Lack of Transportation (Non-Medical):   Physical Activity:   . Days of Exercise per Week:   . Minutes of Exercise per Session:   Stress:   . Feeling of Stress :   Social  Connections:   . Frequency of Communication with Friends and Family:   . Frequency of Social Gatherings with Friends and Family:   . Attends Religious Services:   . Active Member of Clubs or Organizations:   . Attends [employer] Meetings:   Banker Marital Status:   Intimate Partner Violence:   . Fear of Current or Ex-Partner:   . Emotionally Abused:   [place] [employer] Physically Abused:   . Sexually Abused:     ROS Review of Systems  Constitutional: Positive for fatigue and unexpected weight change.  Neurological: Positive for headaches.  All other systems reviewed and are negative.   Objective:   Today's Vitals: BP (!) 179/125 (BP Location: Right Arm, Patient Position: Sitting, Cuff Size: Normal)   Pulse 85   Temp 98 F (36.7 C) (Tympanic)   Ht 5\' 3"  (1.6 m)   Wt 179 lb 9.6 oz (81.5 kg)   SpO2 99%   BMI 31.81 kg/m   Physical Exam General: No apparent distress. Eyes: Extraocular eye movements intact, pupils equal and round. Neck: Supple, trachea midline. Thyroid: No enlargement, mobile without fixation, no tenderness. Cardiovascular: Regular rhythm and rate, no murmur, normal radial pulses. Respiratory: Normal respiratory effort, clear to auscultation. Gastrointestinal: Normal pitch active bowel sounds, nontender  abdomen without distention or appreciable hepatomegaly. Neurologic: Cranial nerves normal as tested, Musculoskeletal: Normal muscle tone, no tenderness on palpation of tibia, no excessive thoracic kyphosis. Skin: Appropriate warmth, no visible rash. Mental status: Alert, conversant, speech clear, thought logical, appropriate mood and affect, no hallucinations or delusions evident. Hematologic/lymphatic: No cervical adenopathy, no visible ecchymoses. Assessment & Plan:  Tanya Carroll was seen today for new patient (initial visit).  Diagnoses and all orders for this visit:  Encounter to establish care Juluis Mire, NP-C will be your  (PCP) she is mastered prepared .  She is skilled to diagnosed and treat illness. Also able to answer health concern as well as continuing care of varied medical conditions, not limited by cause, organ system, or diagnosis.   Screening for diabetes mellitus -     HgB A1c 5.5  Discuss prediabetes 5.7-6.4 decrease carbs and excercise  Essential hypertension Counseled on blood pressure goal of less than 130/80, low-sodium, DASH diet, medication compliance, 150 minutes of moderate intensity exercise per week. Discussed medication compliance, adverse effects. -     cloNIDine (CATAPRES) tablet 0.2 mg  Class 1 obesity due to excess calories without serious comorbidity with body mass index (BMI) of 31.0 to 31.9 in adult Obesity is 30-39 indicating an excess in caloric intake or underlining conditions. This may lead to other co-morbidities. Increase risk for diabetes, and respiratory complications. Lifestyle modifications of diet and exercise may reduce obesity.   Loss of weight Rule out thyroid disease and anemia for fatigue   Outpatient Encounter Medications as of 07/01/2019  Medication Sig  . predniSONE (STERAPRED UNI-PAK 48 TAB) 10 MG (48) TBPK tablet See admin instructions. follow package directions  . amLODipine (NORVASC) 10 MG tablet Take 1 tablet (10 mg total) by mouth daily.  Marland [employer] losartan-hydrochlorothiazide (HYZAAR) 100-25 MG tablet Take 1 tablet by mouth daily.  . [DISCONTINUED] fluticasone (FLONASE) 50 MCG/ACT nasal spray Place 2 sprays into both nostrils daily. (Patient not taking: Reported on 11/20/2018)  . [DISCONTINUED] hydrochlorothiazide (HYDRODIURIL) 25 MG tablet Take 1 tablet (25 mg total) by mouth daily. (Patient not taking: Reported on 11/20/2018)  . [DISCONTINUED] ibuprofen (ADVIL,MOTRIN) 600 MG tablet Take 1 tablet (600 mg total) by mouth every 6 (six) hours as needed for pain. (Patient not taking: Reported on 11/20/2018)  . [EXPIRED] cloNIDine (CATAPRES) tablet 0.2 mg    No facility-administered encounter  medications on file as of 07/01/2019.    Follow-up: Return in about 2 years (around 06/30/2021) for Bp tele follow up.   Kerin Perna, NP

## 2019-07-02 ENCOUNTER — Encounter (INDEPENDENT_AMBULATORY_CARE_PROVIDER_SITE_OTHER): Payer: Self-pay | Admitting: Primary Care

## 2019-07-02 ENCOUNTER — Other Ambulatory Visit (INDEPENDENT_AMBULATORY_CARE_PROVIDER_SITE_OTHER): Payer: Self-pay | Admitting: Primary Care

## 2019-07-02 LAB — CBC WITH DIFFERENTIAL/PLATELET
Basophils Absolute: 0 10*3/uL (ref 0.0–0.2)
Basos: 0 %
EOS (ABSOLUTE): 0 10*3/uL (ref 0.0–0.4)
Eos: 0 %
Hematocrit: 38 % (ref 34.0–46.6)
Hemoglobin: 13.9 g/dL (ref 11.1–15.9)
Immature Grans (Abs): 0 10*3/uL (ref 0.0–0.1)
Immature Granulocytes: 0 %
Lymphocytes Absolute: 3.3 10*3/uL — ABNORMAL HIGH (ref 0.7–3.1)
Lymphs: 36 %
MCH: 40.4 pg — ABNORMAL HIGH (ref 26.6–33.0)
MCHC: 36.6 g/dL — ABNORMAL HIGH (ref 31.5–35.7)
MCV: 111 fL — ABNORMAL HIGH (ref 79–97)
Monocytes Absolute: 1 10*3/uL — ABNORMAL HIGH (ref 0.1–0.9)
Monocytes: 11 %
Neutrophils Absolute: 4.8 10*3/uL (ref 1.4–7.0)
Neutrophils: 53 %
Platelets: 266 10*3/uL (ref 150–450)
RBC: 3.44 x10E6/uL — ABNORMAL LOW (ref 3.77–5.28)
RDW: 12.7 % (ref 11.7–15.4)
WBC: 9.2 10*3/uL (ref 3.4–10.8)

## 2019-07-02 LAB — CMP14+EGFR
ALT: 61 IU/L — ABNORMAL HIGH (ref 0–32)
AST: 109 IU/L — ABNORMAL HIGH (ref 0–40)
Albumin/Globulin Ratio: 1.3 (ref 1.2–2.2)
Albumin: 3.9 g/dL (ref 3.8–4.8)
Alkaline Phosphatase: 98 IU/L (ref 48–121)
BUN/Creatinine Ratio: 11 (ref 9–23)
BUN: 7 mg/dL (ref 6–20)
Bilirubin Total: 0.9 mg/dL (ref 0.0–1.2)
CO2: 28 mmol/L (ref 20–29)
Calcium: 8.4 mg/dL — ABNORMAL LOW (ref 8.7–10.2)
Chloride: 96 mmol/L (ref 96–106)
Creatinine, Ser: 0.62 mg/dL (ref 0.57–1.00)
GFR calc Af Amer: 136 mL/min/{1.73_m2} (ref >59)
GFR calc non Af Amer: 118 mL/min/{1.73_m2} (ref >59)
Globulin, Total: 2.9 g/dL (ref 1.5–4.5)
Glucose: 98 mg/dL (ref 65–99)
Potassium: 2.8 mmol/L — ABNORMAL LOW (ref 3.5–5.2)
Sodium: 140 mmol/L (ref 134–144)
Total Protein: 6.8 g/dL (ref 6.0–8.5)

## 2019-07-02 LAB — LIPID PANEL
Chol/HDL Ratio: 1.6 ratio (ref 0.0–4.4)
Cholesterol, Total: 191 mg/dL (ref 100–199)
HDL: 119 mg/dL (ref >39)
LDL Chol Calc (NIH): 58 mg/dL (ref 0–99)
Triglycerides: 79 mg/dL (ref 0–149)
VLDL Cholesterol Cal: 14 mg/dL (ref 5–40)

## 2019-07-02 LAB — TSH+FREE T4
Free T4: 1.4 ng/dL (ref 0.82–1.77)
TSH: 5.55 u[IU]/mL — ABNORMAL HIGH (ref 0.450–4.500)

## 2019-07-02 MED ORDER — POTASSIUM CHLORIDE CRYS ER 10 MEQ PO TBCR: 10.0000 meq | EXTENDED_RELEASE_TABLET | Freq: Every day | ORAL | 0 refills | Status: DC

## 2019-07-05 ENCOUNTER — Encounter (INDEPENDENT_AMBULATORY_CARE_PROVIDER_SITE_OTHER): Payer: Self-pay | Admitting: Primary Care

## 2019-07-15 ENCOUNTER — Telehealth (INDEPENDENT_AMBULATORY_CARE_PROVIDER_SITE_OTHER): Payer: BC Managed Care – PPO | Admitting: Primary Care

## 2019-07-23 ENCOUNTER — Ambulatory Visit: Payer: BC Managed Care – PPO | Admitting: Registered Nurse

## 2019-08-12 ENCOUNTER — Ambulatory Visit: Payer: Self-pay | Admitting: *Deleted

## 2019-08-12 NOTE — Telephone Encounter (Addendum)
Pt reports swelling of right leg, foot to knee, onset "May."  States worsening past month. Is able to get shoe on "But leaves a dent." Denies any redness, no warmth. States painful at times, from foot to knee,8/10 when occurs, Aleve effective at times. States painful when elevated. Denies SOB, no chest pain. States swelling started when began meds for hypertension, Amlodipine, hyzaar, K+ in May.  Attempted to schedule appt within 24 hours per protocol. With Covid screening, pt reported chills, unsure if febrile, has not  checked temp.  Called practice, [facility], regarding pt's C/O chills. Phone line with pt was dropped. Attempted to CB pt several times, busy signal each attempt. Please advise: [phone]  Answer Assessment - Initial Assessment Questions 1. ONSET: "When did the swelling start?" (e.g., minutes, hours, days)     1 month ago 2. LOCATION: "What part of the leg is swollen?"  "Are both legs swollen or just one leg?"     Entire right leg, foot. 3. SEVERITY: "How bad is the swelling?" (e.g., localized; mild, moderate, severe)  - Localized - small area of swelling localized to one leg  - MILD pedal edema - swelling limited to foot and ankle, pitting edema < 1/4 inch (6 mm) deep, rest and elevation eliminate most or all swelling  - MODERATE edema - swelling of lower leg to knee, pitting edema > 1/4 inch (6 mm) deep, rest and elevation only partially reduce swelling  - SEVERE edema - swelling extends above knee, facial or hand swelling present      Moderate, "Stops at knee" 4. REDNESS: "Does the swelling look red or infected?"     no 5. PAIN: "Is the swelling painful to touch?" If Yes, ask: "How painful is it?"   (Scale 1-10; mild, moderate or severe)     "From time to time"  Aleve effective at times.  8-9/10 when occurs 6. FEVER: "Do you have a fever?" If Yes, ask: "What is it, how was it measured, and when did it start?"      no 7. CAUSE: "What do you think is causing the leg  swelling?"     unsure 8. MEDICAL HISTORY: "Do you have a history of heart failure, kidney disease, liver failure, or cancer?"     *No Answer* 9. RECURRENT SYMPTOM: "Have you had leg swelling before?" If Yes, ask: "When was the last time?" "What happened that time?"     Yes, with BP 10. OTHER SYMPTOMS: "Do you have any other symptoms?" (e.g., chest pain, difficulty breathing)       no 11. PREGNANCY: "Is there any chance you are pregnant?" "When was your last menstrual period?"       no  Protocols used: LEG SWELLING AND EDEMA-A-AH

## 2019-08-12 NOTE — Telephone Encounter (Signed)
°  Reason for Disposition  [1] MODERATE leg swelling (e.g., swelling extends up to knees) AND [2] new-onset or worsening  Protocols used: LEG SWELLING AND EDEMA-A-AH

## 2019-08-28 ENCOUNTER — Other Ambulatory Visit (INDEPENDENT_AMBULATORY_CARE_PROVIDER_SITE_OTHER): Payer: Self-pay | Admitting: Primary Care

## 2019-08-28 NOTE — Telephone Encounter (Signed)
Requested Prescriptions  Pending Prescriptions Disp Refills  . potassium chloride (KLOR-CON) 10 MEQ tablet [Pharmacy Med Name: POTASSIUM CL MICRO ER TABS] 60 tablet 0    Sig: TAKE 1 TABLET(10 MEQ) BY MOUTH DAILY     Endocrinology:  Minerals - Potassium Supplementation Failed - 08/28/2019 10:05 AM      Failed - K in normal range and within 360 days    Potassium  Date Value Ref Range Status  07/01/2019 2.8 (L) 3.5 - 5.2 mmol/L Final         Passed - Cr in normal range and within 360 days    Creatinine, Ser  Date Value Ref Range Status  07/01/2019 0.62 0.57 - 1.00 mg/dL Final         Passed - Valid encounter within last 12 months    Recent Outpatient Visits          1 month ago Encounter to establish care   [facility] Grayce Sessions, NP

## 2019-09-15 ENCOUNTER — Encounter (HOSPITAL_COMMUNITY): Payer: Self-pay | Admitting: Emergency Medicine

## 2019-09-15 ENCOUNTER — Emergency Department (HOSPITAL_COMMUNITY): Payer: BC Managed Care – PPO

## 2019-09-15 ENCOUNTER — Emergency Department (HOSPITAL_COMMUNITY)
Admission: EM | Admit: 2019-09-15 | Discharge: 2019-09-15 | Disposition: A | Discharge status: Home / Self Care | Payer: BC Managed Care – PPO | Attending: Emergency Medicine | Admitting: Emergency Medicine

## 2019-09-15 ENCOUNTER — Other Ambulatory Visit: Payer: Self-pay

## 2019-09-15 DIAGNOSIS — R112 Nausea with vomiting, unspecified: Secondary | ICD-10-CM | POA: Insufficient documentation

## 2019-09-15 DIAGNOSIS — Z79899 Other long term (current) drug therapy: Secondary | ICD-10-CM | POA: Diagnosis not present

## 2019-09-15 DIAGNOSIS — M25472 Effusion, left ankle: Secondary | ICD-10-CM | POA: Insufficient documentation

## 2019-09-15 DIAGNOSIS — R000 Tachycardia, unspecified: Secondary | ICD-10-CM | POA: Insufficient documentation

## 2019-09-15 DIAGNOSIS — I10 Essential (primary) hypertension: Secondary | ICD-10-CM | POA: Insufficient documentation

## 2019-09-15 DIAGNOSIS — N3000 Acute cystitis without hematuria: Secondary | ICD-10-CM

## 2019-09-15 HISTORY — DX: Essential (primary) hypertension: I10

## 2019-09-15 LAB — COMPREHENSIVE METABOLIC PANEL
ALT: 31 U/L (ref 0–44)
AST: 47 U/L — ABNORMAL HIGH (ref 15–41)
Albumin: 3.5 g/dL (ref 3.5–5.0)
Alkaline Phosphatase: 98 U/L (ref 38–126)
Anion gap: 15 (ref 5–15)
BUN: 8 mg/dL (ref 6–20)
CO2: 28 mmol/L (ref 22–32)
Calcium: 8.6 mg/dL — ABNORMAL LOW (ref 8.9–10.3)
Chloride: 99 mmol/L (ref 98–111)
Creatinine, Ser: 1.36 mg/dL — ABNORMAL HIGH (ref 0.44–1.00)
GFR calc Af Amer: 59 mL/min — ABNORMAL LOW (ref >60)
GFR calc non Af Amer: 51 mL/min — ABNORMAL LOW (ref >60)
Glucose, Bld: 98 mg/dL (ref 70–99)
Potassium: 3.3 mmol/L — ABNORMAL LOW (ref 3.5–5.1)
Sodium: 142 mmol/L (ref 135–145)
Total Bilirubin: 2.1 mg/dL — ABNORMAL HIGH (ref 0.3–1.2)
Total Protein: 7.2 g/dL (ref 6.5–8.1)

## 2019-09-15 LAB — LIPASE, BLOOD: Lipase: 24 U/L (ref 11–51)

## 2019-09-15 LAB — CBC
HCT: 36.5 % (ref 36.0–46.0)
Hemoglobin: 13 g/dL (ref 12.0–15.0)
MCH: 41.9 pg — ABNORMAL HIGH (ref 26.0–34.0)
MCHC: 35.6 g/dL (ref 30.0–36.0)
MCV: 117.7 fL — ABNORMAL HIGH (ref 80.0–100.0)
Platelets: 130 10*3/uL — ABNORMAL LOW (ref 150–400)
RBC: 3.1 MIL/uL — ABNORMAL LOW (ref 3.87–5.11)
RDW: 16 % — ABNORMAL HIGH (ref 11.5–15.5)
WBC: 8.1 10*3/uL (ref 4.0–10.5)
nRBC: 0 % (ref 0.0–0.2)

## 2019-09-15 LAB — I-STAT BETA HCG BLOOD, ED (NOT ORDERABLE): I-stat hCG, quantitative: <5 m[IU]/mL (ref <5)

## 2019-09-15 LAB — URINALYSIS, ROUTINE W REFLEX MICROSCOPIC
Bilirubin Urine: NEGATIVE
Glucose, UA: NEGATIVE mg/dL
Ketones, ur: 20 mg/dL — AB
Nitrite: POSITIVE — AB
Protein, ur: 30 mg/dL — AB
Specific Gravity, Urine: 1.016 (ref 1.005–1.030)
WBC, UA: >50 WBC/hpf — ABNORMAL HIGH (ref 0–5)
pH: 5 (ref 5.0–8.0)

## 2019-09-15 LAB — TSH: TSH: 2.653 u[IU]/mL (ref 0.350–4.500)

## 2019-09-15 IMAGING — CR DG FOOT COMPLETE 3+V*L*
3 series · 3 of 3 positions shown · non-contrast
Comparison: Left ankle series today.  Left foot series [DATE].

CLINICAL DATA: 33-year-old female with 3 days of foot and ankle
swelling. No known injury.

EXAM:
LEFT FOOT - COMPLETE 3+ VIEW

[x foot ap left]
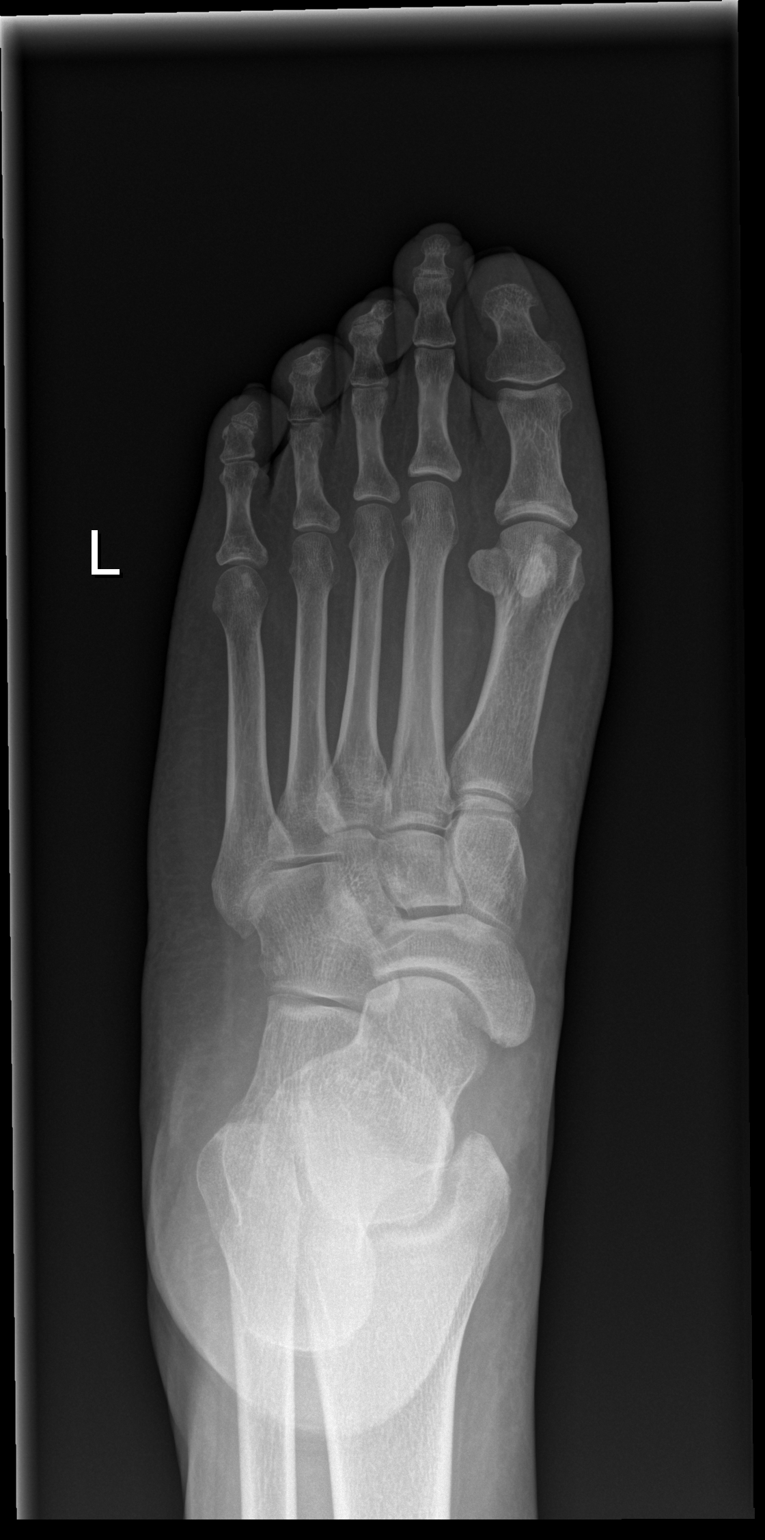

[x foot obl left]
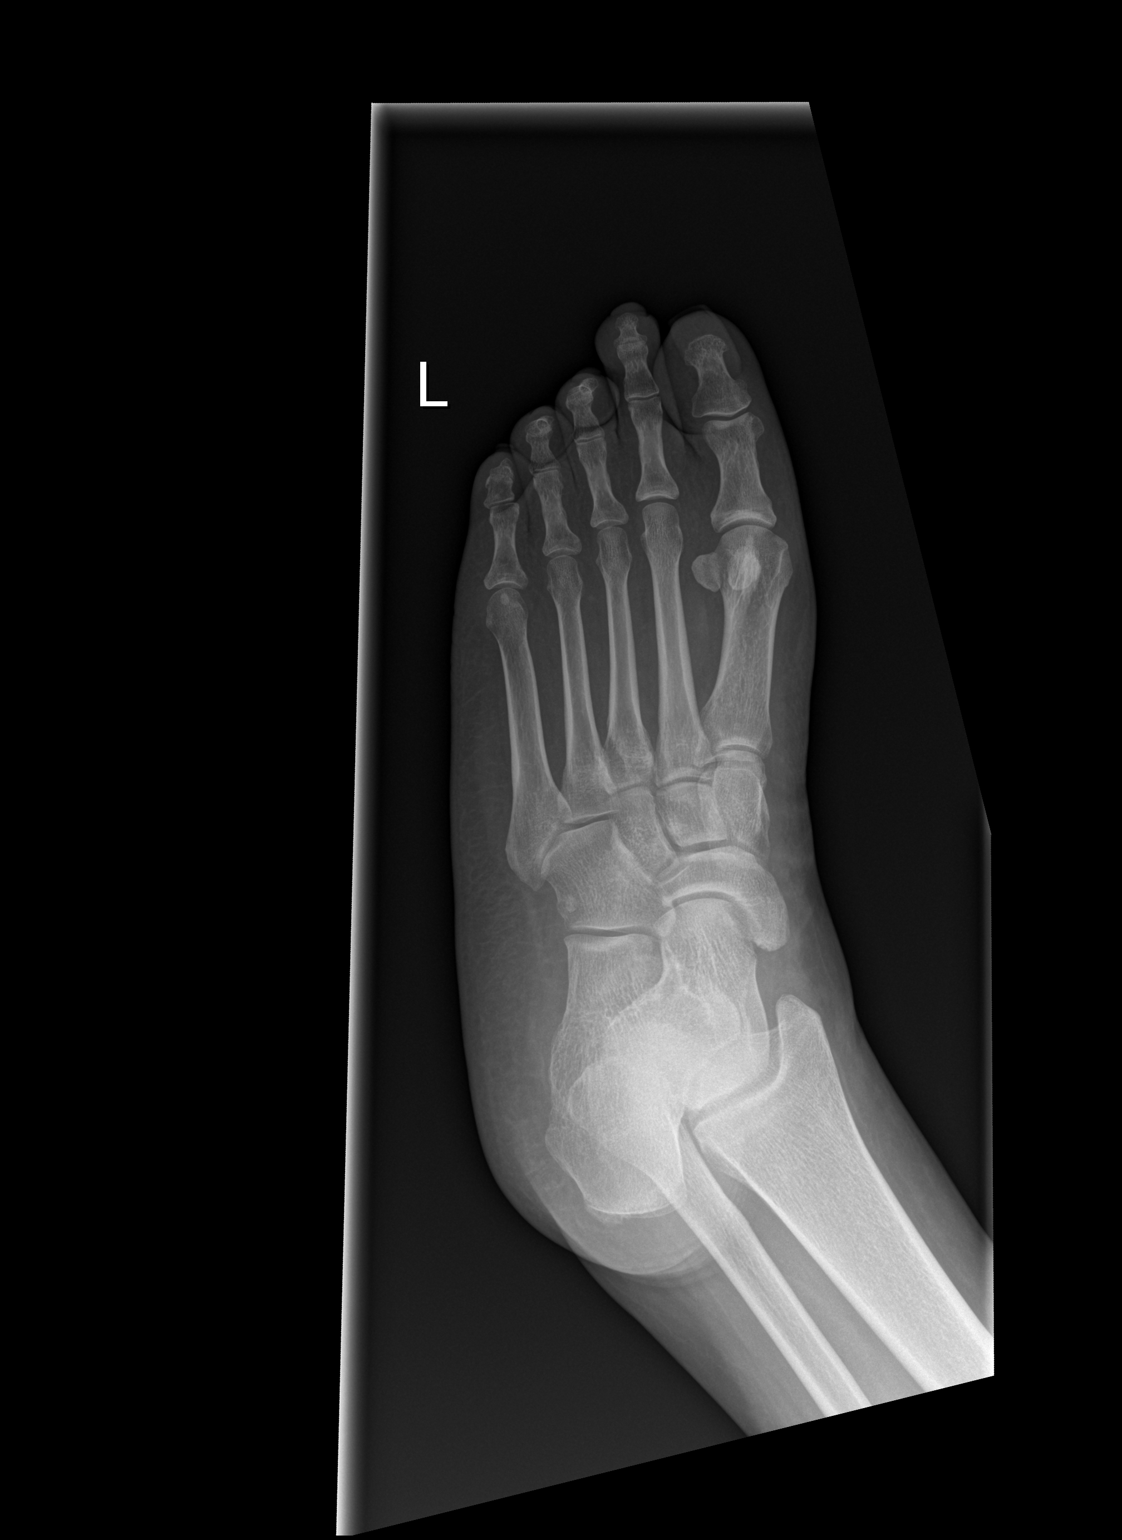

[x foot lat left]
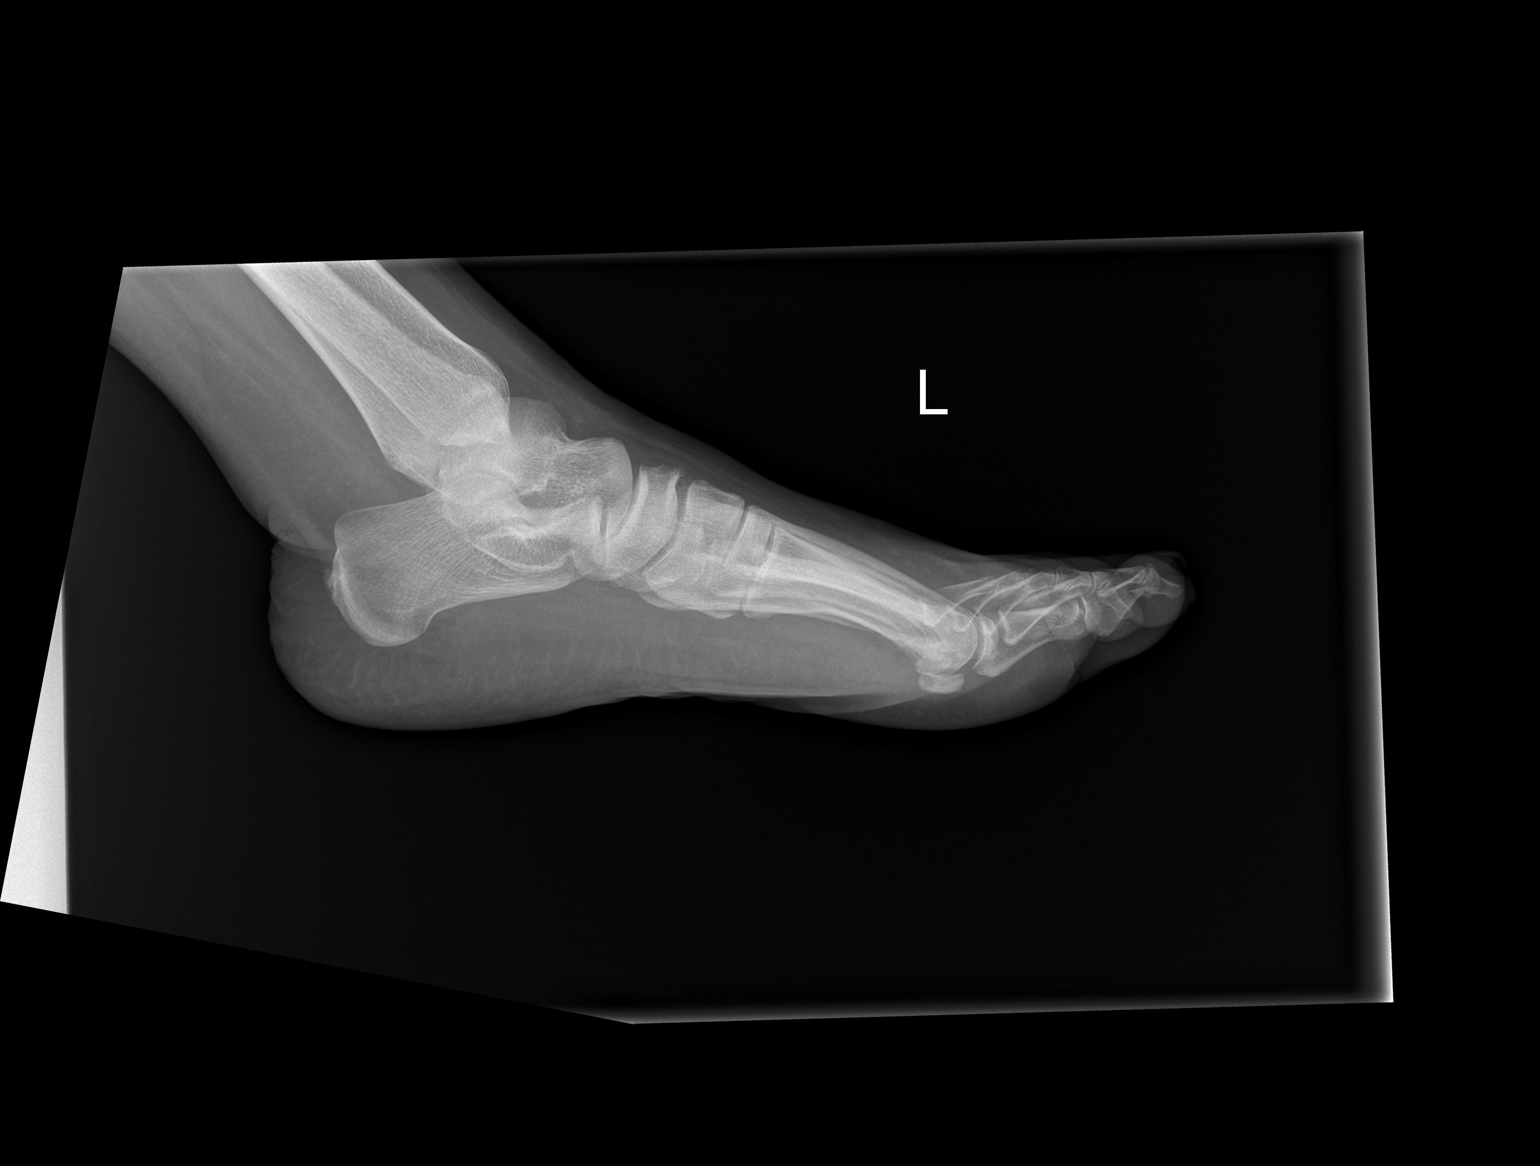

[3 of 3 positions shown; findings below may reference images not displayed]

FINDINGS: Bone mineralization is within normal limits. Stable joint spaces and
alignment throughout the left foot. No fracture or dislocation
identified. Accessory ossicle again noted adjacent to the cuboid. No
acute osseous abnormality identified. Possible soft tissue swelling
but no discrete soft tissue abnormality.
IMPRESSION: No acute osseous abnormality identified in the left foot.

## 2019-09-15 IMAGING — US US ABDOMEN LIMITED
1 series · 14 of 25 positions shown · non-contrast
Comparison: None.

CLINICAL DATA: 33-year-old female with right upper quadrant
abdominal pain.

EXAM:
ULTRASOUND ABDOMEN LIMITED RIGHT UPPER QUADRANT

[Series 1: us abdomen limited · 14 of 40 slices shown]
[im 1/40]
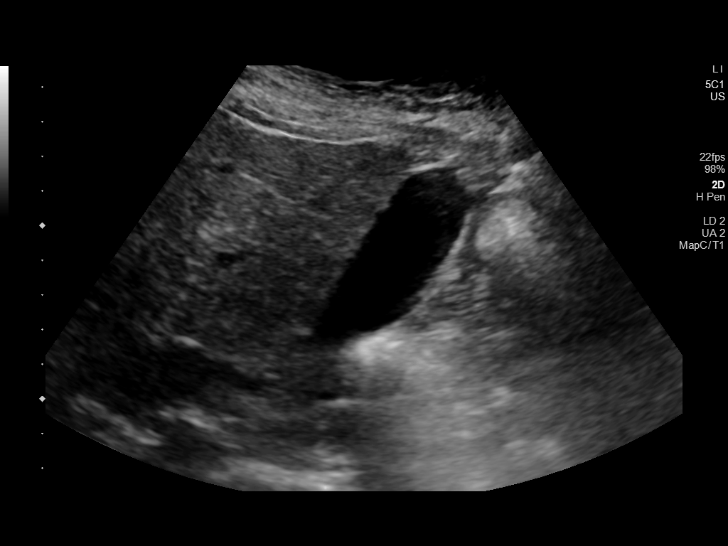
[im 4/40]
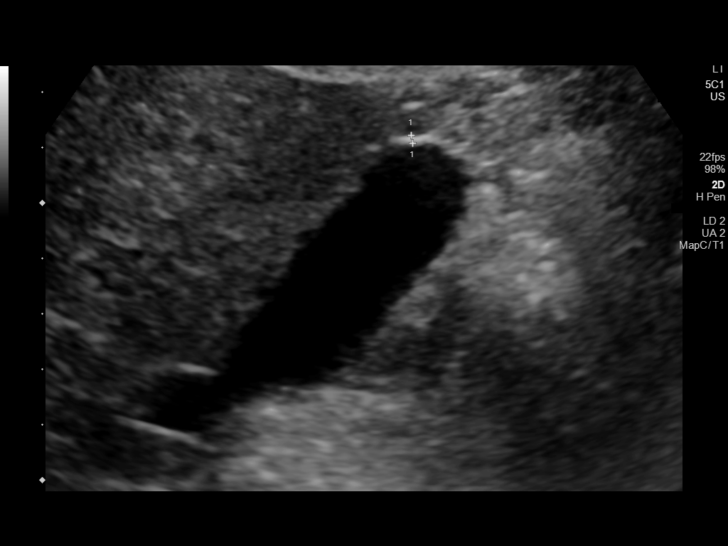
[im 7/40]
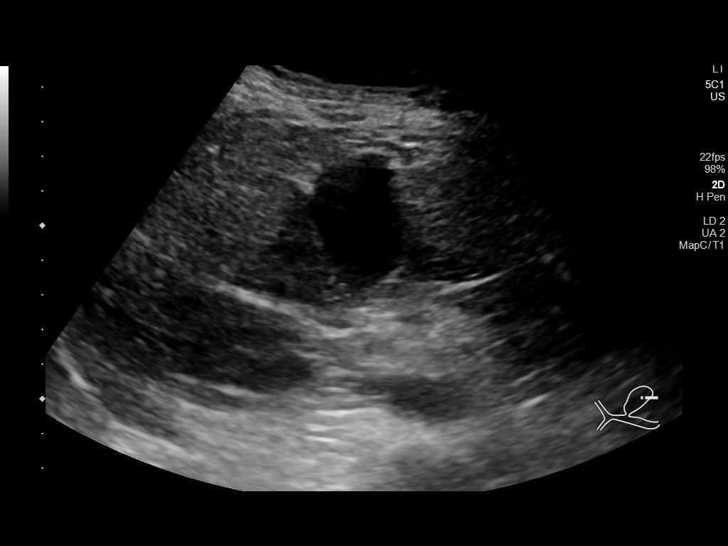
[im 10/40]
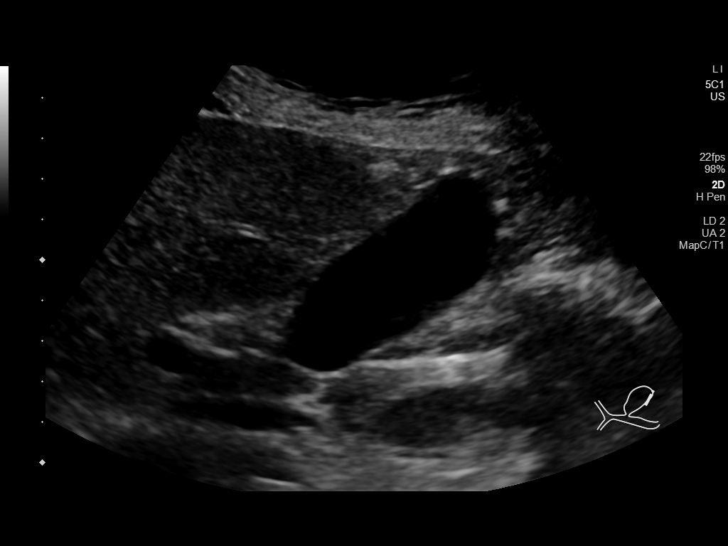
[im 14/40]
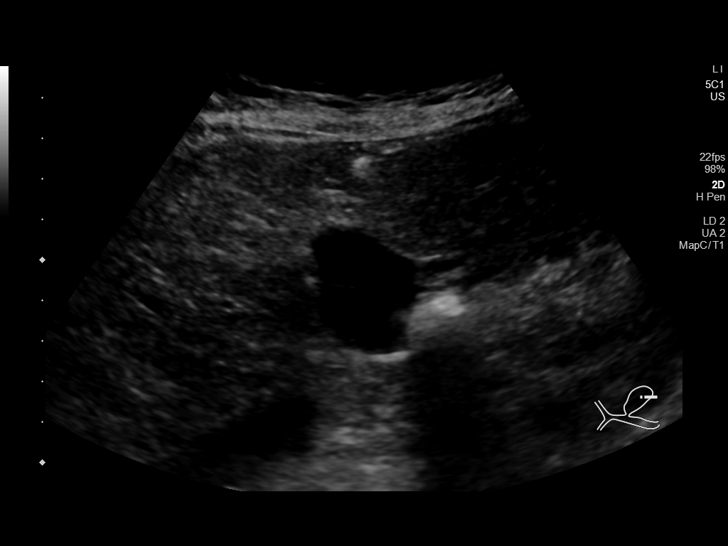
[im 15/40]
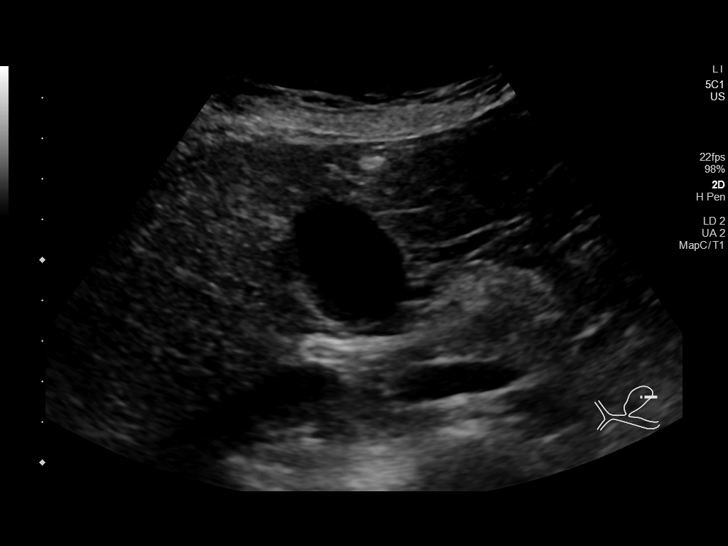
[im 18/40]
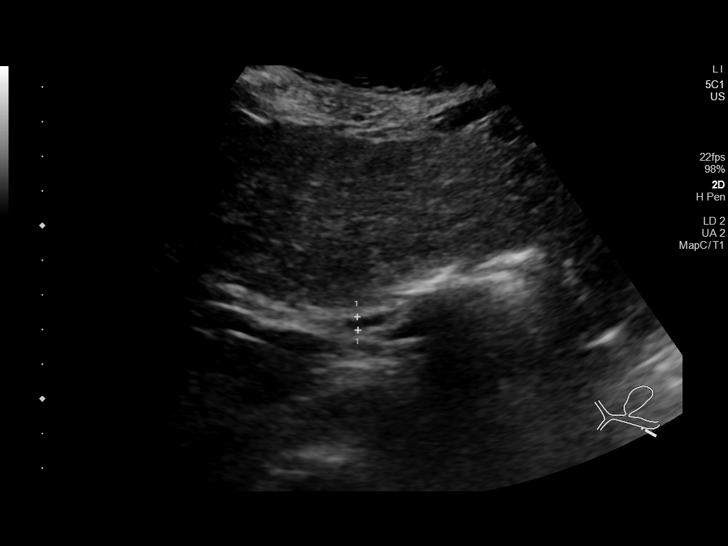
[im 22/40]
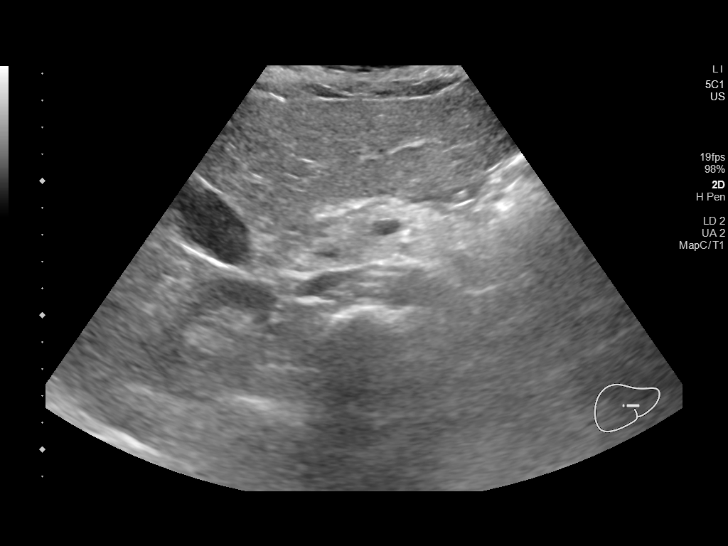
[im 25/40]
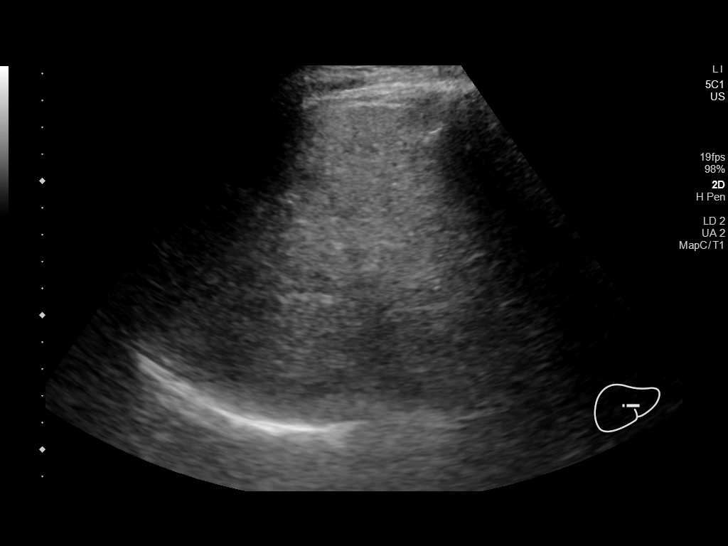
[im 27/40]
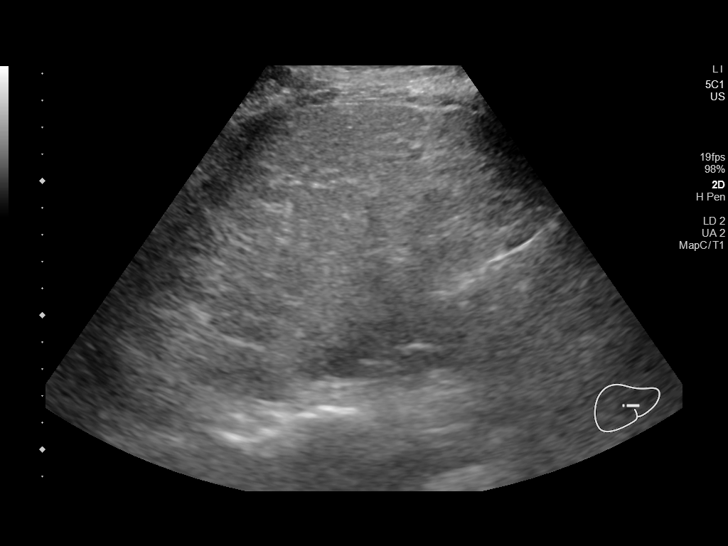
[im 30/40]
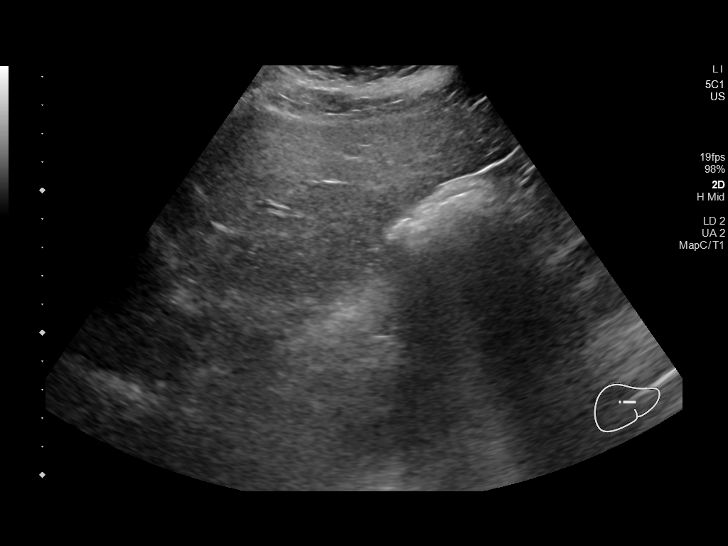
[im 33/40]
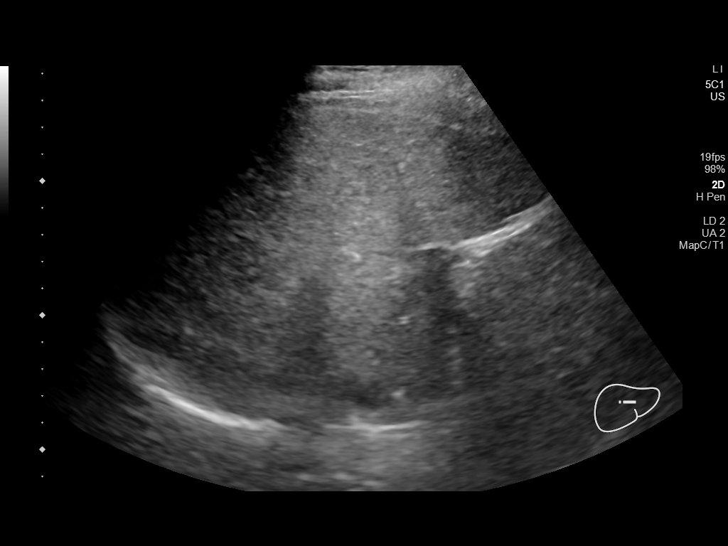
[im 36/40]
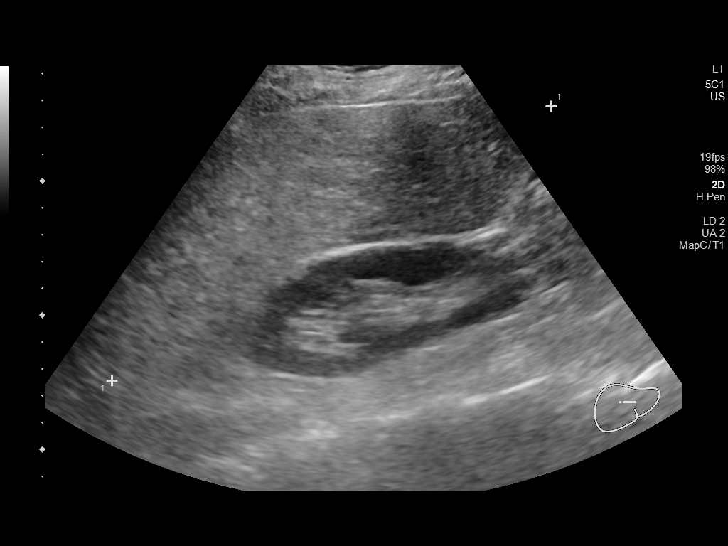
[im 40/40]
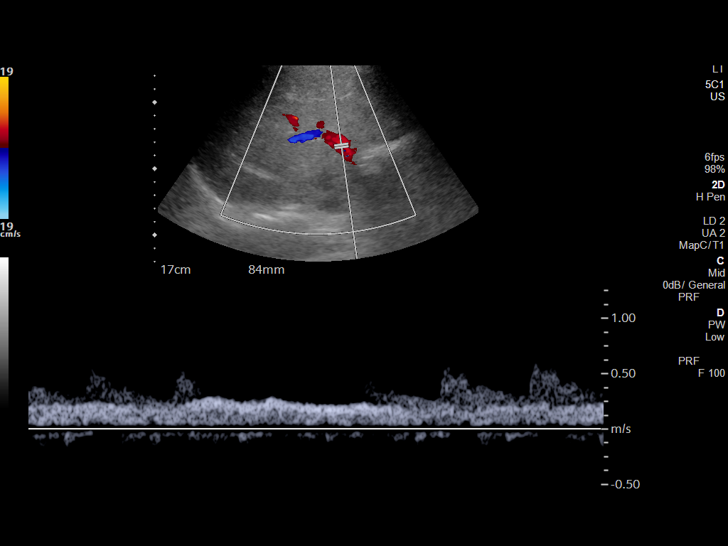

[14 of 25 positions shown; findings below may reference images not displayed]

FINDINGS: Gallbladder:

No gallstones or wall thickening visualized. No sonographic Murphy
sign noted by sonographer.

Common bile duct:

Diameter: 4 mm

Liver:

The liver is enlarged measuring 19 cm in midclavicular length. There
is diffuse increased liver echogenicity most commonly seen in the
setting of fatty infiltration. Superimposed inflammation or fibrosis
is not excluded. Clinical correlation is recommended. Portal vein is
patent on color Doppler imaging with normal direction of blood flow
towards the liver.

Other: None.
IMPRESSION: 1. No gallstone.
2. Enlarged fatty liver may represent steatohepatitis. Correlation
with LFTs recommended.

## 2019-09-15 IMAGING — CR DG ANKLE COMPLETE 3+V*L*
3 series · 3 of 3 positions shown · non-contrast
Comparison: None.

CLINICAL DATA: 33-year-old female with 3 days of foot and ankle
swelling. No known injury.

EXAM:
LEFT ANKLE COMPLETE - 3+ VIEW

[x ankle lat left]
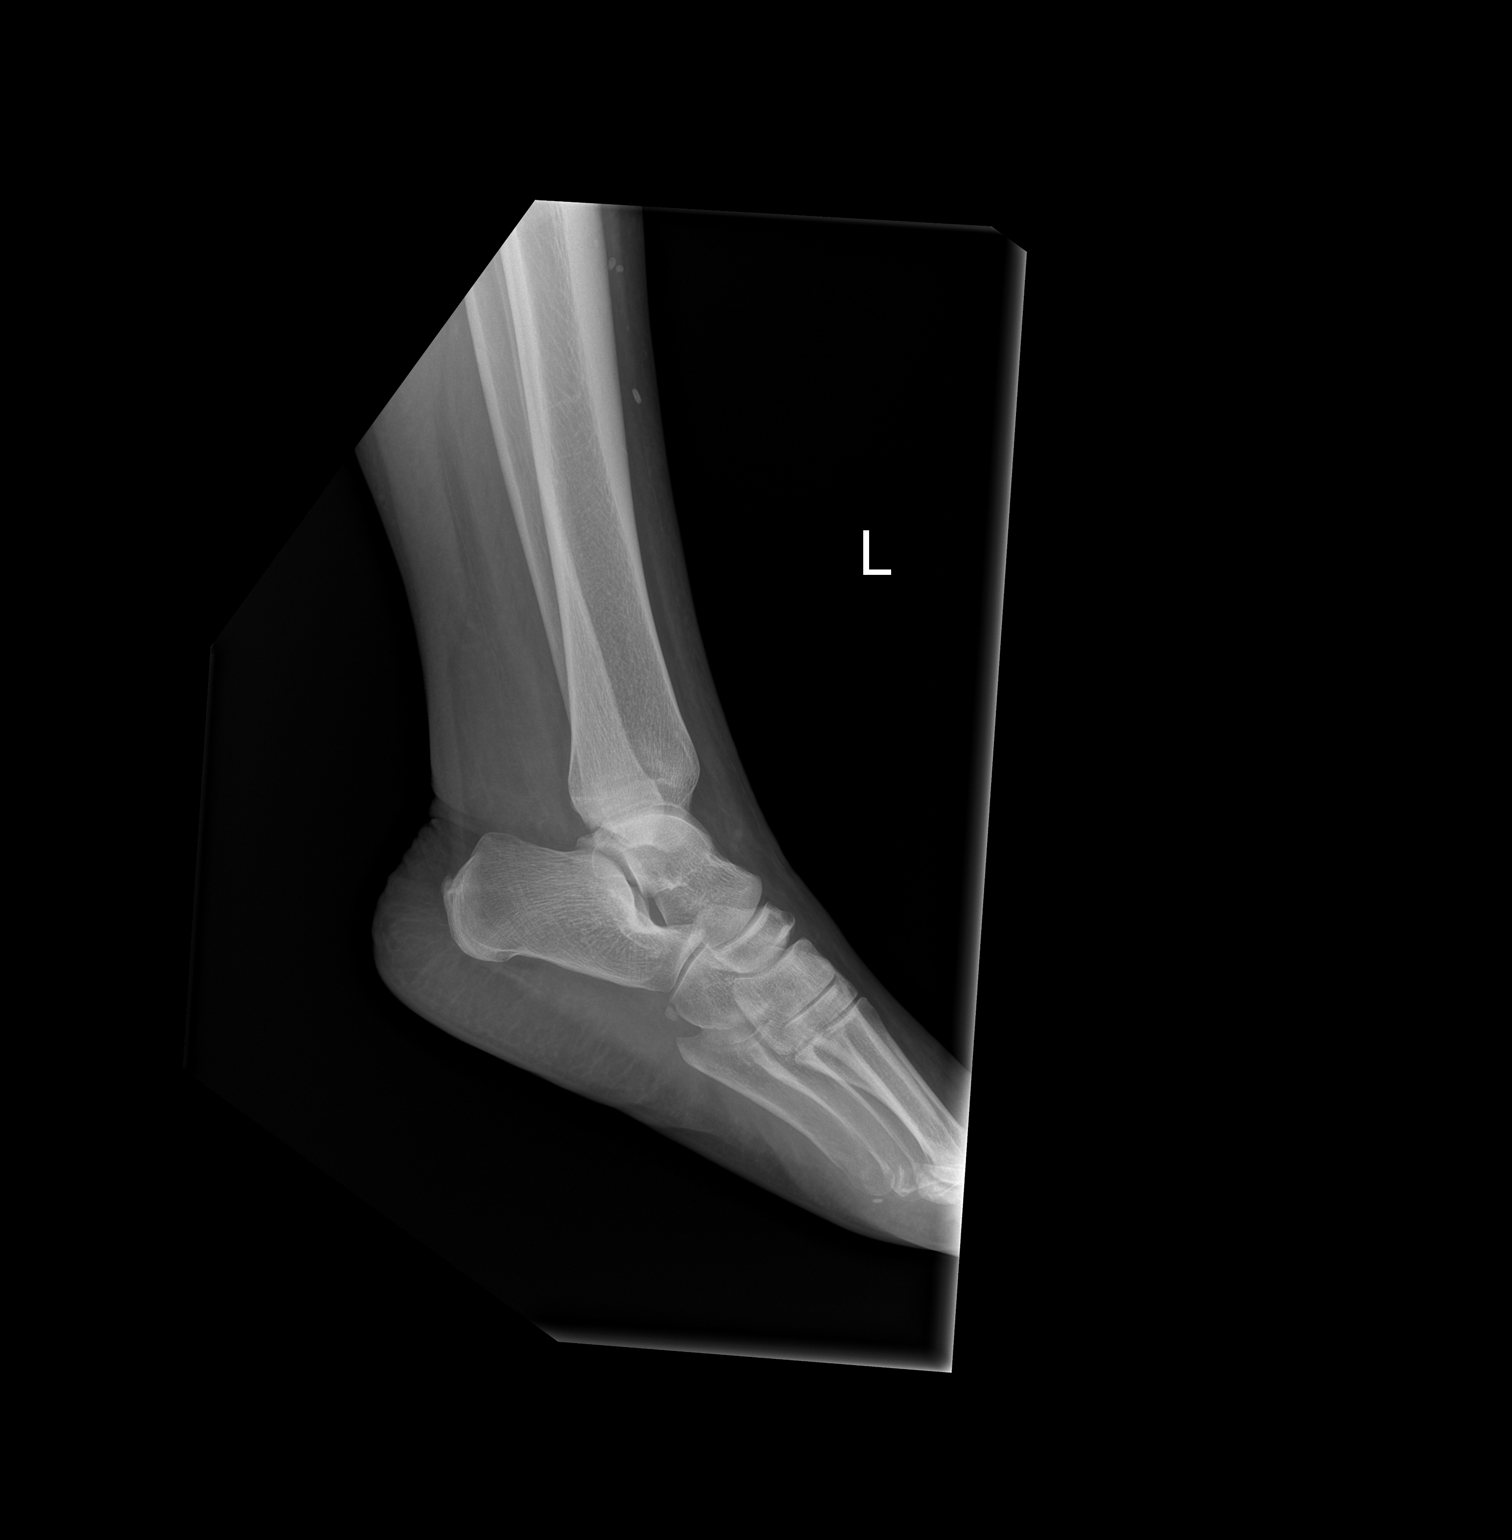

[x ankle ap left]
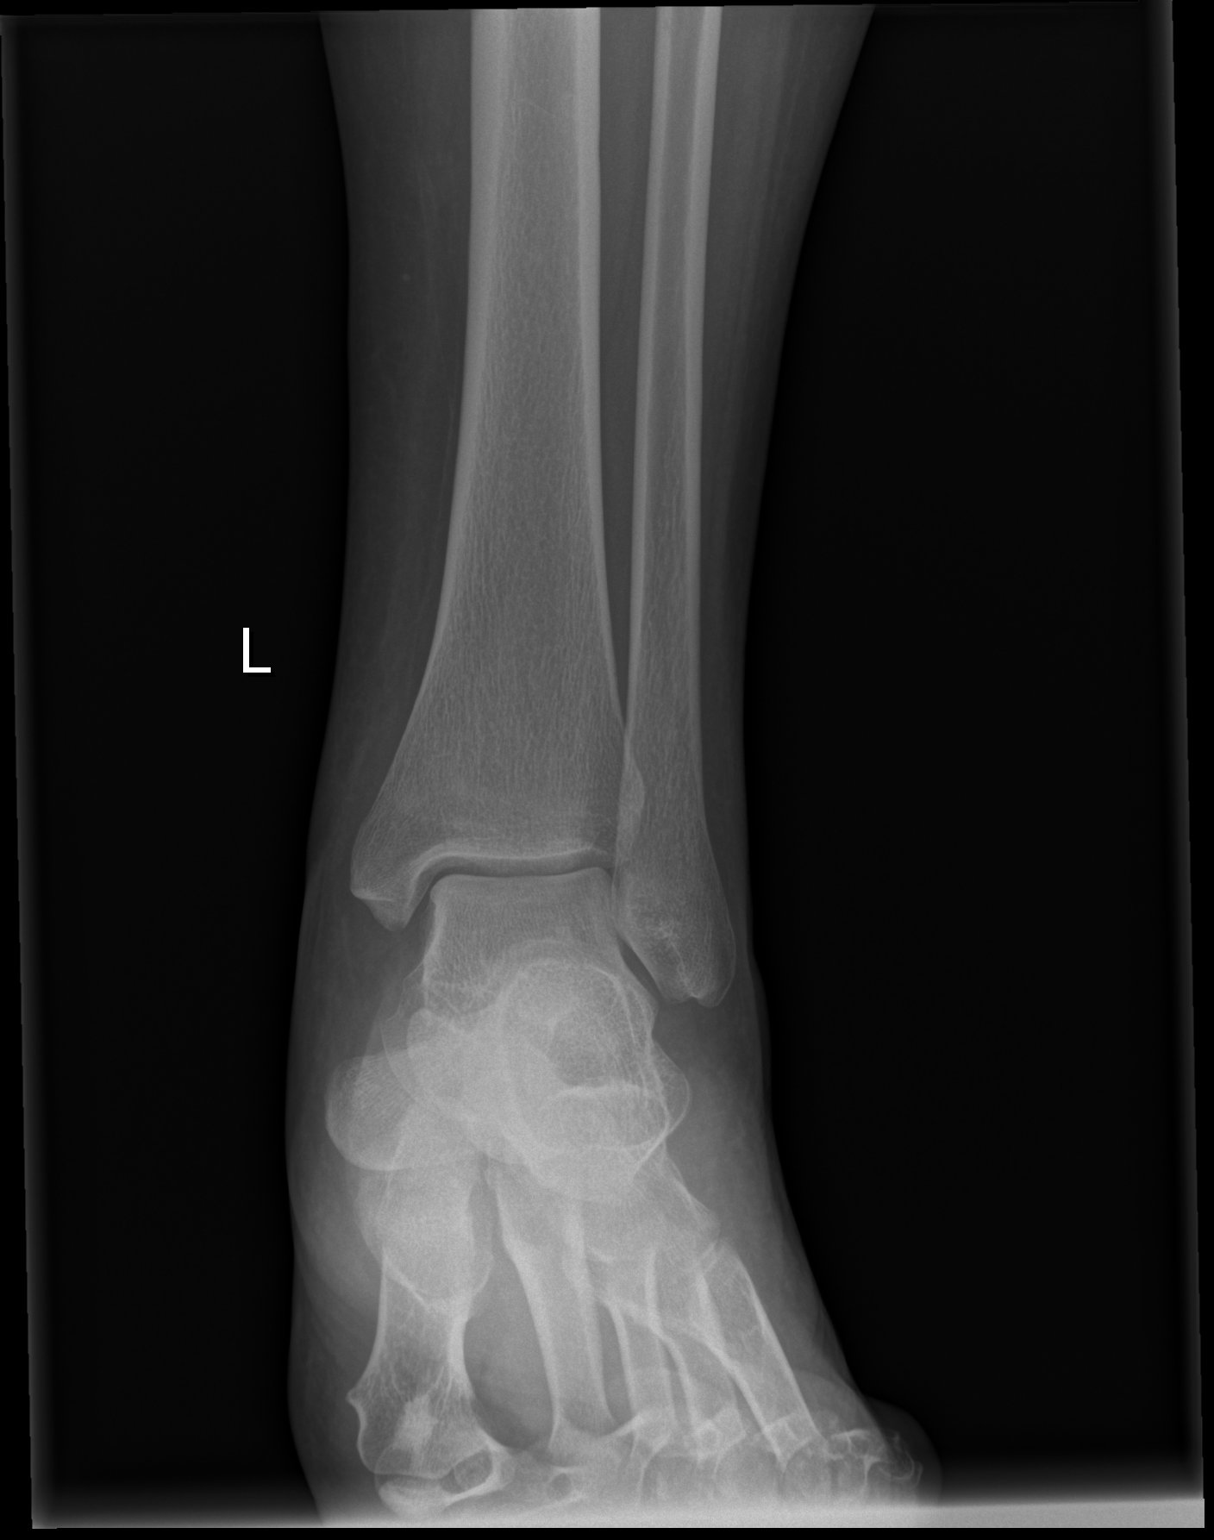

[x ankle obl left]
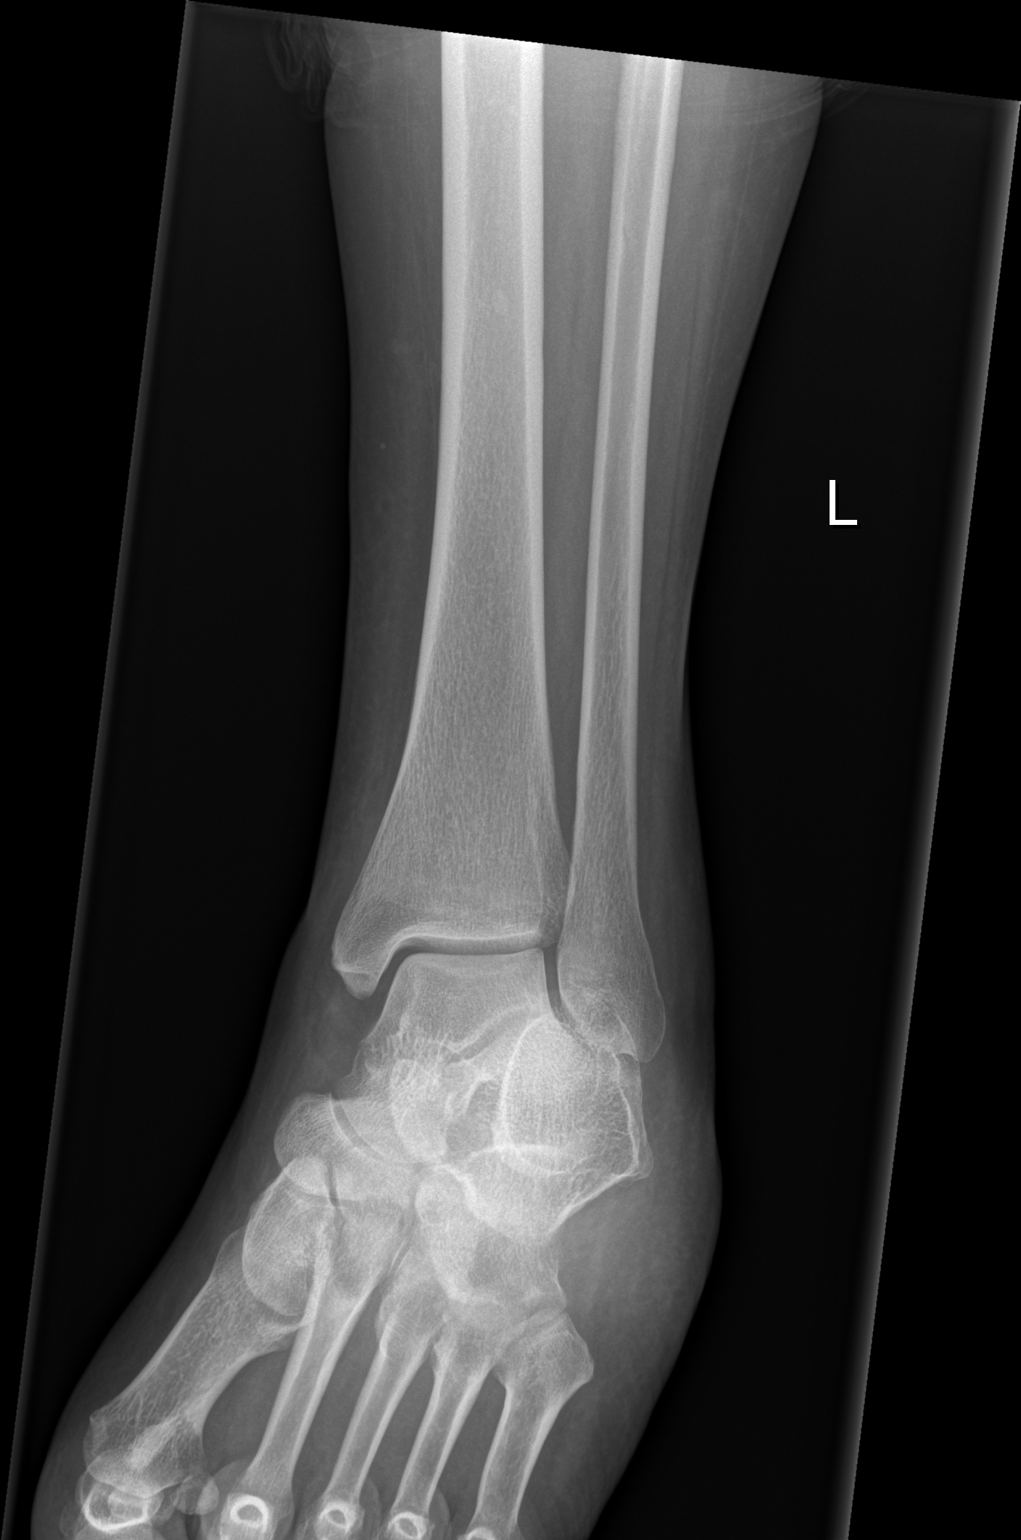

[3 of 3 positions shown; findings below may reference images not displayed]

FINDINGS: No evidence of joint effusion on the lateral. Preserved mortise
joint alignment. Talar dome intact. Bone mineralization is within
normal limits. The distal tibia, fibula and calcaneus appear intact.
Mild vascular and/or dystrophic calcifications in the anterior soft
tissues at the distal tibia. Visible bones of the left foot appear
intact. No soft tissue gas.
IMPRESSION: No acute osseous abnormality identified about the left ankle.

## 2019-09-15 MED ORDER — ONDANSETRON 4 MG PO TBDP: ORAL_TABLET | ORAL | 0 refills | Status: DC

## 2019-09-15 MED ORDER — SODIUM CHLORIDE 0.9 % IV SOLN
1.0000 g | Freq: Once | INTRAVENOUS | Status: AC
  Administered 2019-09-15: 1 g via INTRAVENOUS
  Filled 2019-09-15: qty 10

## 2019-09-15 MED ORDER — SODIUM CHLORIDE 0.9 % IV BOLUS
1000.0000 mL | Freq: Once | INTRAVENOUS | Status: AC
  Administered 2019-09-15: 1000 mL via INTRAVENOUS

## 2019-09-15 MED ORDER — ONDANSETRON HCL 4 MG/2ML IJ SOLN
4.0000 mg | Freq: Once | INTRAMUSCULAR | Status: AC
  Administered 2019-09-15: 4 mg via INTRAVENOUS
  Filled 2019-09-15: qty 2

## 2019-09-15 MED ORDER — CEPHALEXIN 500 MG PO CAPS: 500.0000 mg | ORAL_CAPSULE | Freq: Three times a day (TID) | ORAL | 0 refills | Status: DC

## 2019-09-15 NOTE — Discharge Instructions (Addendum)
Take zofran for nausea   Take keflex three times daily for 5 days for UTI   Stay hydrated   See your doctor   Return to ER if you have worse abdominal pain, vomiting, fever, dehydration

## 2019-09-15 NOTE — ED Notes (Signed)
Pt provided with food and drink will continue to monitor

## 2019-09-15 NOTE — ED Provider Notes (Signed)
[facility]-EMERGENCY DEPT Provider Note   CSN: [id number] Arrival date & time: 09/15/19  1117     History Chief Complaint  Patient presents with  . Emesis  . Foot Swelling    Tanya Carroll is a 34 y.o. female hx of hypertension, here presenting with left ankle swelling and nausea vomiting and decreased urine output.  Patient states that for the last several days, she noticed that her heart rate has been elevated and she has been losing weight.  She also noticed that her left ankle has been swollen as well.  Denies any trauma or injury to the ankle.  Patient also has been having abdominal pain and unable to keep anything down.  She states that she is urinating less but denies any fevers or dysuria.  She states that her heart rate has been elevated around 130 at home.  She states that she has been losing weight unintentionally.   The history is provided by the patient.       Past Medical History:  Diagnosis Date  . Hypertension   . Placenta previa     There are no problems to display for this patient.   History reviewed. No pertinent surgical history.   OB History    Gravida  1   Para  1   Term  1   Preterm      AB      Living        SAB      TAB      Ectopic      Multiple      Live Births              Family History  Problem Relation Age of Onset  . Hypertension Mother   . Hypertension Sister     Social History   Tobacco Use  . Smoking status: Never Smoker  . Smokeless tobacco: Never Used  Substance Use Topics  . Alcohol use: Yes    Alcohol/week: 2.0 standard drinks    Types: 2 Shots of liquor per week    Comment: Once or twice a week.  . Drug use: No    Home Medications Prior to Admission medications   Medication Sig Start Date End Date Taking? Authorizing Provider  amLODipine (NORVASC) 10 MG tablet Take 1 tablet (10 mg total) by mouth daily. 07/01/19  Yes Grayce Sessions, NP  losartan-hydrochlorothiazide  (HYZAAR) 100-25 MG tablet Take 1 tablet by mouth daily. 07/01/19  Yes Grayce Sessions, NP  potassium chloride (KLOR-CON) 10 MEQ tablet TAKE 1 TABLET(10 MEQ) BY MOUTH DAILY Patient taking differently: Take 10 mEq by mouth daily.  08/28/19  Yes Grayce Sessions, NP    Allergies    Citrus  Review of Systems   Review of Systems  Gastrointestinal: Positive for vomiting.  Musculoskeletal:       L ankle pain   All other systems reviewed and are negative.   Physical Exam Updated Vital Signs BP 123/87 (BP Location: Left Arm)   Pulse (!) 103   Temp 97.9 F (36.6 C) (Oral)   Resp 18   Ht 5\' 7"  (1.702 m)   Wt 82 kg   SpO2 100%   BMI 28.31 kg/m   Physical Exam Vitals and nursing note reviewed.  HENT:     Head: Normocephalic.     Nose: Nose normal.     Mouth/Throat:     Mouth: Mucous membranes are dry.  Eyes:  Extraocular Movements: Extraocular movements intact.     Pupils: Pupils are equal, round, and reactive to light.  Cardiovascular:     Rate and Rhythm: Regular rhythm. Tachycardia present.     Pulses: Normal pulses.     Heart sounds: Normal heart sounds.  Pulmonary:     Effort: Pulmonary effort is normal.     Breath sounds: Normal breath sounds.  Abdominal:     General: Abdomen is flat.     Palpations: Abdomen is soft.  Musculoskeletal:        General: Normal range of motion.     Cervical back: Normal range of motion.     Comments: L ankle swollen and mildly tender, no obvious deformity, no calf tenderness. Neurovascular intact L lower extremity   Skin:    General: Skin is warm.     Capillary Refill: Capillary refill takes less than 2 seconds.  Neurological:     General: No focal deficit present.     Mental Status: She is alert and oriented to person, place, and time.  Psychiatric:        Mood and Affect: Mood normal.        Behavior: Behavior normal.     ED Results / Procedures / Treatments   Labs (all labs ordered are listed, but only abnormal  results are displayed) Labs Reviewed  COMPREHENSIVE METABOLIC PANEL - Abnormal; Notable for the following components:      Result Value   Potassium 3.3 (*)    Creatinine, Ser 1.36 (*)    Calcium 8.6 (*)    AST 47 (*)    Total Bilirubin 2.1 (*)    GFR calc non Af Amer 51 (*)    GFR calc Af Amer 59 (*)    All other components within normal limits  URINALYSIS, ROUTINE W REFLEX MICROSCOPIC - Abnormal; Notable for the following components:   Color, Urine AMBER (*)    APPearance CLOUDY (*)    Hgb urine dipstick SMALL (*)    Ketones, ur 20 (*)    Protein, ur 30 (*)    Nitrite POSITIVE (*)    Leukocytes,Ua LARGE (*)    WBC, UA >50 (*)    Bacteria, UA MANY (*)    All other components within normal limits  CBC - Abnormal; Notable for the following components:   RBC 3.10 (*)    MCV 117.7 (*)    MCH 41.9 (*)    RDW 16.0 (*)    Platelets 130 (*)    All other components within normal limits  URINE CULTURE  LIPASE, BLOOD  TSH  I-STAT BETA HCG BLOOD, ED ([facility], [facility], [facility] ONLY)  I-STAT BETA HCG BLOOD, ED (NOT ORDERABLE)    EKG None  Radiology DG Ankle Complete Left  Result Date: 09/15/2019 CLINICAL DATA:  34 year old female with 3 days of foot and ankle swelling. No known injury. EXAM: LEFT ANKLE COMPLETE - 3+ VIEW COMPARISON:  None. FINDINGS: No evidence of joint effusion on the lateral. Preserved mortise joint alignment. Talar dome intact. Bone mineralization is within normal limits. The distal tibia, fibula and calcaneus appear intact. Mild vascular and/or dystrophic calcifications in the anterior soft tissues at the distal tibia. Visible bones of the left foot appear intact. No soft tissue gas. IMPRESSION: No acute osseous abnormality identified about the left ankle. Electronically Signed   By: Odessa Fleming M.D.   On: 09/15/2019 12:41   DG Foot Complete Left  Result Date: 09/15/2019 CLINICAL DATA:  34 year old female  with 3 days of foot and ankle swelling. No known injury. EXAM: LEFT FOOT -  COMPLETE 3+ VIEW COMPARISON:  Left ankle series today.  Left foot series 10/30/2018. FINDINGS: Bone mineralization is within normal limits. Stable joint spaces and alignment throughout the left foot. No fracture or dislocation identified. Accessory ossicle again noted adjacent to the cuboid. No acute osseous abnormality identified. Possible soft tissue swelling but no discrete soft tissue abnormality. IMPRESSION: No acute osseous abnormality identified in the left foot. Electronically Signed   By: Odessa Fleming M.D.   On: 09/15/2019 12:43   US Abdomen Limited RUQ  Result Date: 09/15/2019 CLINICAL DATA:  34 year old female with right upper quadrant abdominal pain. EXAM: ULTRASOUND ABDOMEN LIMITED RIGHT UPPER QUADRANT COMPARISON:  None. FINDINGS: Gallbladder: No gallstones or wall thickening visualized. No sonographic Murphy sign noted by sonographer. Common bile duct: Diameter: 4 mm Liver: The liver is enlarged measuring 19 cm in midclavicular length. There is diffuse increased liver echogenicity most commonly seen in the setting of fatty infiltration. Superimposed inflammation or fibrosis is not excluded. Clinical correlation is recommended. Portal vein is patent on color Doppler imaging with normal direction of blood flow towards the liver. Other: None. IMPRESSION: 1. No gallstone. 2. Enlarged fatty liver may represent steatohepatitis. Correlation with LFTs recommended. Electronically Signed   By: Elgie Collard M.D.   On: 09/15/2019 18:45    Procedures Procedures (including critical care time)  Medications Ordered in ED Medications  sodium chloride 0.9 % bolus 1,000 mL (1,000 mLs Intravenous New Bag/Given (Non-Interop) 09/15/19 2047)  ondansetron (ZOFRAN) injection 4 mg (4 mg Intravenous Given 09/15/19 2046)  cefTRIAXone (ROCEPHIN) 1 g in sodium chloride 0.9 % 100 mL IVPB (1 g Intravenous New Bag/Given 09/15/19 2048)    ED Course  I have reviewed the triage vital signs and the nursing  notes.  Pertinent labs & imaging results that were available during my care of the patient were reviewed by me and considered in my medical decision making (see chart for details).    MDM Rules/Calculators/A&P                         Tanya Carroll is a 34 y.o. female here presenting with nausea vomiting and foot swelling.  I think the left foot swelling likely dependent edema.  Consider gastroenteritis or UTI causing her nausea vomiting.  Plan to get CBC, CMP, urinalysis.  Will give IV fluids and reassess.  11:00 PM UA + UTI. Cr 1.3, slightly elevated. Given IV abx. Was tachycardic initially and now after IVF, HR down to 103. TSH nl. Tolerated PO. Will dc home with keflex, zofran.    Final Clinical Impression(s) / ED Diagnoses Final diagnoses:  Bilirubinemia    Rx / DC Orders ED Discharge Orders    None       Charlynne Pander, MD 09/15/19 2302

## 2019-09-15 NOTE — ED Triage Notes (Addendum)
Patient has had nausea and vomiting x4 days, denies abd pain, SOB, chest pain. L foot was noticed to be swollen on Monday, denies any trauma, falls or twisting. Started on losartan, amlodipine and potassium in May.

## 2019-09-15 NOTE — ED Provider Notes (Signed)
Patient placed in Quick Look pathway, seen and evaluated   Chief Complaint: NV, foot swelling  HPI:   34 y/o F presenting for eval of NV for 4 days. States she has been unable to tolerate any PO for the last several days. Has had intermittent diarrhea. Denies constipation. Is passing flatus. Denies fever or urinary sxs. Denies hematochezia or melena. Denies significant abd pain but does have some discomfort when she vomits. States she has been losing weight and her HR has been elevated. She also woke up with foot swelling to the bottom of the foot. No calf pain/swelling  ROS: nv (one)  Physical Exam:   Gen: No distress  Neuro: Awake and Alert  Skin: Warm    Focused Exam: Abd soft and nontender.    Initiation of care has begun. The patient has been counseled on the process, plan, and necessity for staying for the completion/evaluation, and the remainder of the medical screening examination    Rayne Du 09/15/19 2051    Charlynne Pander, MD 09/15/19 2208

## 2019-09-15 NOTE — ED Notes (Signed)
Unable to get CBC stuck patient 3 times

## 2019-09-18 LAB — URINE CULTURE: Culture: >=100000 — AB

## 2019-09-19 ENCOUNTER — Telehealth (HOSPITAL_BASED_OUTPATIENT_CLINIC_OR_DEPARTMENT_OTHER): Payer: Self-pay | Admitting: Emergency Medicine

## 2019-09-19 NOTE — Telephone Encounter (Signed)
Post ED Visit - Positive Culture Follow-up  Culture report reviewed by antimicrobial stewardship pharmacist: [facility] []  Nathan Batchelder, Pharm.D. []  , Pharm.D., BCPS AQ-ID []  , Pharm.D., BCPS []  Celedonio Miyamoto, Pharm.D., BCPS []  Pulcifer, Garvin Fila.D., BCPS, AAHIVP []  , Pharm.D., BCPS, AAHIVP []  Georgina Pillion, PharmD, BCPS []  , PharmD, BCPS []  [place], PharmD, BCPS []  [address], PharmD []  , PharmD, BCPS []  Estella Husk, PharmD  Pharmacy Team []  Lysle Pearl, PharmD []  , PharmD []  Phillips Climes, PharmD []  , Rph []  Agapito Games) , PharmD []  Verlan Friends, PharmD []  , PharmD []  Mervyn Gay, PharmD []  , PharmD []  Vinnie Level, PharmD [x]  [facility], PharmD []  , PharmD []  Len Childs, PharmD   Positive urine culture Treated with Cephalexin, organism sensitive to the same and no further patient follow-up is required at this time.  09/19/2019, 3:57 PM

## 2019-09-20 ENCOUNTER — Ambulatory Visit (INDEPENDENT_AMBULATORY_CARE_PROVIDER_SITE_OTHER): Payer: BC Managed Care – PPO | Admitting: Family Medicine

## 2019-09-28 ENCOUNTER — Ambulatory Visit (INDEPENDENT_AMBULATORY_CARE_PROVIDER_SITE_OTHER): Payer: BC Managed Care – PPO | Admitting: Family Medicine

## 2019-11-29 ENCOUNTER — Encounter (INDEPENDENT_AMBULATORY_CARE_PROVIDER_SITE_OTHER): Payer: Self-pay | Admitting: Primary Care

## 2019-11-29 ENCOUNTER — Ambulatory Visit (INDEPENDENT_AMBULATORY_CARE_PROVIDER_SITE_OTHER): Payer: Self-pay | Admitting: Primary Care

## 2019-11-29 ENCOUNTER — Ambulatory Visit: Payer: Self-pay

## 2019-11-29 ENCOUNTER — Other Ambulatory Visit: Payer: Self-pay

## 2019-11-29 VITALS — Temp 97.3°F | Ht 67.0 in | Wt 168.0 lb

## 2019-11-29 DIAGNOSIS — R079 Chest pain, unspecified: Secondary | ICD-10-CM

## 2019-11-29 DIAGNOSIS — N3001 Acute cystitis with hematuria: Secondary | ICD-10-CM

## 2019-11-29 DIAGNOSIS — R630 Anorexia: Secondary | ICD-10-CM

## 2019-11-29 DIAGNOSIS — R002 Palpitations: Secondary | ICD-10-CM

## 2019-11-29 DIAGNOSIS — R319 Hematuria, unspecified: Secondary | ICD-10-CM

## 2019-11-29 LAB — POCT URINALYSIS DIP (CLINITEK)
Glucose, UA: 100 mg/dL — AB
Nitrite, UA: POSITIVE — AB
POC PROTEIN,UA: >=300 — AB
Spec Grav, UA: 1.025 (ref 1.010–1.025)
Urobilinogen, UA: >=8 E.U./dL — AB
pH, UA: 5.5 (ref 5.0–8.0)

## 2019-11-29 MED ORDER — PHENAZOPYRIDINE HCL 100 MG PO TABS: 100.0000 mg | ORAL_TABLET | Freq: Three times a day (TID) | ORAL | 0 refills | Status: DC | PRN

## 2019-11-29 NOTE — Progress Notes (Signed)
[facility]  Established Patient Office Visit  Subjective:  Patient ID: Tanya Carroll, female    DOB: 29-Feb-1985  Age: 34 y.o. MRN: [id number]  CC:  Chief Complaint  Patient presents with  . Hematuria  . Dizziness    HPI Ms. Tanya Carroll is a 34 year old female who presents for hematuria and dizziness.  Especially with positional change blood pressure today was taken orthostatically and significant from sitting to standing.  She has also had a difficult time with hydration and eating  Past Medical History:  Diagnosis Date  . Hypertension   . Placenta previa     No past surgical history on file.  Family History  Problem Relation Age of Onset  . Hypertension Mother   . Hypertension Sister     Social History   Socioeconomic History  . Marital status: Single    Spouse name: Not on file  . Number of children: Not on file  . Years of education: Not on file  . Highest education level: Not on file  Occupational History  . Not on file  Tobacco Use  . Smoking status: Never Smoker  . Smokeless tobacco: Never Used  Substance and Sexual Activity  . Alcohol use: Yes    Alcohol/week: 2.0 standard drinks    Types: 2 Shots of liquor per week    Comment: Once or twice a week.  . Drug use: No  . Sexual activity: Yes    Birth control/protection: Injection  Other Topics Concern  . Not on file  Social History Narrative  . Not on file   Social Determinants of Health   Financial Resource Strain:   . Difficulty of Paying Living Expenses: Not on file  Food Insecurity:   . Worried About Programme researcher, broadcasting/film/video in the Last Year: Not on file  . Ran Out of Food in the Last Year: Not on file  Transportation Needs:   . Lack of Transportation (Medical): Not on file  . Lack of Transportation (Non-Medical): Not on file  Physical Activity:   . Days of Exercise per Week: Not on file  . Minutes of Exercise per Session: Not on file  Stress:   . Feeling of Stress : Not on file   Social Connections:   . Frequency of Communication with Friends and Family: Not on file  . Frequency of Social Gatherings with Friends and Family: Not on file  . Attends Religious Services: Not on file  . Active Member of Clubs or Organizations: Not on file  . Attends Banker Meetings: Not on file  . Marital Status: Not on file  Intimate Partner Violence:   . Fear of Current or Ex-Partner: Not on file  . Emotionally Abused: Not on file  . Physically Abused: Not on file  . Sexually Abused: Not on file    Outpatient Medications Prior to Visit  Medication Sig Dispense Refill  . amLODipine (NORVASC) 10 MG tablet Take 1 tablet (10 mg total) by mouth daily. 90 tablet 3  . losartan-hydrochlorothiazide (HYZAAR) 100-25 MG tablet Take 1 tablet by mouth daily. 90 tablet 3  . potassium chloride (KLOR-CON) 10 MEQ tablet TAKE 1 TABLET(10 MEQ) BY MOUTH DAILY (Patient taking differently: Take 10 mEq by mouth daily. ) 30 tablet 0  . cephALEXin (KEFLEX) 500 MG capsule Take 1 capsule (500 mg total) by mouth 3 (three) times daily. 15 capsule 0  . ondansetron (ZOFRAN ODT) 4 MG disintegrating tablet 4mg  ODT q4 hours prn nausea/vomit 10 tablet 0  No facility-administered medications prior to visit.    Allergies  Allergen Reactions  . Citrus     ROS Review of Systems  Constitutional: Positive for fatigue.  Cardiovascular: Positive for palpitations.       Feet swelling  Gastrointestinal: Positive for nausea.  Neurological: Positive for dizziness.  All other systems reviewed and are negative.     Objective:    Physical Exam General: Vital signs reviewed.  Patient is well-developed and well-nourished, in no acute distress and cooperative with exam.  Head: Normocephalic and atraumatic. Eyes: EOMI, conjunctivae normal, no scleral icterus.  Neck: Supple, trachea midline, normal ROM, no JVD, masses, thyromegaly, or carotid bruit present.  Cardiovascular: RRR, S1 normal, S2 normal, no  murmurs, gallops, or rubs. Pulmonary/Chest: Clear to auscultation bilaterally, no wheezes, rales, or rhonchi. Abdominal: Soft, non-tender, non-distended, BS +, no masses, organomegaly, or guarding present.  Musculoskeletal: No joint deformities, erythema, or stiffness, ROM full and nontender. Extremities: No lower extremity edema bilaterally,  pulses symmetric and intact bilaterally. No cyanosis or clubbing. Neurological: A&O x3, Strength is normal and symmetric bilaterally, cranial nerve II-XII are grossly intact, no focal motor deficit, sensory intact to light touch bilaterally.  Skin: Warm, dry and intact. No rashes or erythema. Psychiatric: Normal mood and affect. speech and behavior is normal. Cognition and memory are normal.  Temp (!) 97.3 F (36.3 C) (Temporal)   Ht 5\' 7"  (1.702 m)   Wt 168 lb (76.2 kg)   SpO2 100%   BMI 26.31 kg/m  Wt Readings from Last 3 Encounters:  11/29/19 168 lb (76.2 kg)  09/15/19 180 lb 12.4 oz (82 kg)  07/01/19 179 lb 9.6 oz (81.5 kg)     Health Maintenance Due  Topic Date Due  . Hepatitis C Screening  Never done  . COVID-19 Vaccine (1) Never done  . HIV Screening  Never done  . PAP SMEAR-Modifier  Never done    There are no preventive care reminders to display for this patient.  Lab Results  Component Value Date   TSH 2.653 09/15/2019   Lab Results  Component Value Date   WBC 8.1 09/15/2019   HGB 13.0 09/15/2019   HCT 36.5 09/15/2019   MCV 117.7 (H) 09/15/2019   PLT 130 (L) 09/15/2019   Lab Results  Component Value Date   NA 142 09/15/2019   K 3.3 (L) 09/15/2019   CO2 28 09/15/2019   GLUCOSE 98 09/15/2019   BUN 8 09/15/2019   CREATININE 1.36 (H) 09/15/2019   BILITOT 2.1 (H) 09/15/2019   ALKPHOS 98 09/15/2019   AST 47 (H) 09/15/2019   ALT 31 09/15/2019   PROT 7.2 09/15/2019   ALBUMIN 3.5 09/15/2019   CALCIUM 8.6 (L) 09/15/2019   ANIONGAP 15 09/15/2019   Lab Results  Component Value Date   CHOL 191 07/01/2019   Lab  Results  Component Value Date   HDL 119 07/01/2019   Lab Results  Component Value Date   LDLCALC 58 07/01/2019   Lab Results  Component Value Date   TRIG 79 07/01/2019   Lab Results  Component Value Date   CHOLHDL 1.6 07/01/2019   Lab Results  Component Value Date   HGBA1C 5.5 07/01/2019      Assessment & Plan:  Tanya Carroll was seen today for hematuria and dizziness.  Diagnoses and all orders for this visit:  Hematuria, unspecified type Urine culture sent.  We will call you with the results.   Push fluids and get plenty of rest.  Take antibiotic as directed and to completion Take pyridium as prescribed and as needed for symptomatic relief -     POCT URINALYSIS DIP (CLINITEK) -     Urine Culture -    -     phenazopyridine (PYRIDIUM) 100 MG tablet; Take 1 tablet (100 mg total) by mouth 3 (three) times daily as needed for pain.  Palpitations -     EKG 12-Lead  Loss of appetite Encourage to take supplemental feeding ensure, boost or protein drinks Body mass index is 26.31 kg/m.Marland Kitchen Monitor weight   Chest pain, unspecified type  -     EKG 12-Lead  Acute cystitis with hematuria -     -     phenazopyridine (PYRIDIUM) 100 MG tablet; Take 1 tablet (100 mg total) by mouth 3 (three) times daily as needed for pain.   Meds ordered this encounter  Medications  . DISCONTD: phenazopyridine (PYRIDIUM) 100 MG tablet    Sig: Take 1 tablet (100 mg total) by mouth 3 (three) times daily as needed for pain.    Dispense:  10 tablet    Refill:  0  . phenazopyridine (PYRIDIUM) 100 MG tablet    Sig: Take 1 tablet (100 mg total) by mouth 3 (three) times daily as needed for pain.    Dispense:  10 tablet    Refill:  0    Follow-up: Return if symptoms worsen or fail to improve.    Grayce Sessions, NP

## 2019-11-29 NOTE — Telephone Encounter (Signed)
Blood in urine and dizziness. Hematuria started last Monday. Blood in toilet and when wiped. On Saturday started increase in amount but darker blood. Noted this am as well. Water in toilet pink. Burning, frequency, and urgency noted. Bladder pressure that is constant. Mild burning only when she voids.  Had dizziness last Thursday and Friday. Nausea and vomiting last Thursday. No further dizziness. RLE is always numb and right foot pt stated that is de to neuropathy and numb all the time".  Pt has had a 50 # weight loss in the past few months. Pt stated she is able to tolerate fluids. Pt has been seen before for dizziness and BP meds adjusted which resolved issue. Pt reports chronic constipation (last BM 2 weeks ago) Care advice given to pt and pt given appt today with PCP. Pt verbalized understanding.  Reason for Disposition . Blood in urine  (Exception: could be normal menstrual bleeding) . [1] MILD dizziness (e.g., vertigo; walking normally) AND [2] has been evaluated by physician for this  Answer Assessment - Initial Assessment Questions 1. COLOR of URINE: "Describe the color of the urine."  (e.g., tea-colored, pink, red, blood clots, bloody)     Dark red 2. ONSET: "When did the bleeding start?"      11/22/19 3. EPISODES: "How many times has there been blood in the urine?" or "How many times today?"     Everyday since the 11/22/19 and that void was just before phone call 4. PAIN with URINATION: "Is there any pain with passing your urine?" If Yes, ask: "How bad is the pain?"  (Scale 1-10; or mild, moderate, severe)    - MILD - complains slightly about urination hurting    - MODERATE - interferes with normal activities      - SEVERE - excruciating, unwilling or unable to urinate because of the pain     mild 5. FEVER: "Do you have a fever?" If Yes, ask: "What is your temperature, how was it measured, and when did it start?"     no 6. ASSOCIATED SYMPTOMS: "Are you passing urine more frequently  than usual?"     yes 7. OTHER SYMPTOMS: "Do you have any other symptoms?" (e.g., back/flank pain, abdominal pain, vomiting)     Dizziness- H/o vomiting on Saturday 8. PREGNANCY: "Is there any chance you are pregnant?" "When was your last menstrual period?"     No- no  Answer Assessment - Initial Assessment Questions 1. DESCRIPTION: "Describe your dizziness."     Feels like room is spinning and legs weak 2. VERTIGO: "Do you feel like either you or the room is spinning or tilting?"      yes 3. LIGHTHEADED: "Do you feel lightheaded?" (e.g., somewhat faint, woozy, weak upon standing)     no 4. SEVERITY: "How bad is it?"  "Can you walk?"   - MILD: Feels unsteady but walking normally.   - MODERATE: Feels very unsteady when walking, but not falling; interferes with normal activities (e.g., school, work) .   - SEVERE: Unable to walk without falling, or requires assistance to walk without falling.     mild 5. ONSET:  "When did the dizziness begin?"     Last Thursday and Friday - none since then 6. AGGRAVATING FACTORS: "Does anything make it worse?" (e.g., standing, change in head position)     Standing still 7. CAUSE: "What do you think is causing the dizziness?"    Vomiting, poor appetite 8. RECURRENT SYMPTOM: "Have you had dizziness before?"  If Yes, ask: "When was the last time?" "What happened that time?"     Yes- BP was not under control 9. OTHER SYMPTOMS: "Do you have any other symptoms?" (e.g., headache, weakness, numbness, vomiting, earache)     Nausea, poor appetite, constipation, losing weight (50 #)-unintentional weight loss 10. PREGNANCY: "Is there any chance you are pregnant?" "When was your last menstrual period?"       No/no  Protocols used: URINE - BLOOD IN-A-AH, DIZZINESS - VERTIGO-A-AH

## 2019-11-29 NOTE — Patient Instructions (Signed)
Acute Urinary Retention, Female  Acute urinary retention means that you cannot pee (urinate) at all, or that you pee too little and your bladder is not emptied completely. If it is not treated, it can lead to kidney damage or other serious problems. Follow these instructions at home:  Take over-the-counter and prescription medicines only as told by your doctor. Ask your doctor what medicines you should stay away from. Do not take any medicine unless your doctor says it is okay to do so.  If you were sent home with a tube that drains pee from the bladder (catheter), take care of it as told by your doctor.  Drink enough fluid to keep your pee clear or pale yellow.  If you were given an antibiotic, take it as told by your doctor. Do not stop taking the antibiotic even if you start to feel better.  Do not use any products that contain nicotine or tobacco, such as cigarettes and e-cigarettes. If you need help quitting, ask your doctor.  Watch for changes in your symptoms. Tell your doctor about them.  If told, keep track of any changes in your blood pressure at home. Tell your doctor about them.  Keep all follow-up visits as told by your doctor. This is important. Contact a doctor if:  You have spasms or you leak pee when you have spasms. Get help right away if:  You have chills or a fever.  You have blood in your pee.  You have a tube that drains the bladder and: ? The tube stops draining pee. ? The tube falls out. Summary  Acute urinary retention means that you cannot pee at all, or that you pee too little and your bladder is not emptied completely. If it is not treated, it can result in kidney damage or other serious problems.  If you were sent home with a tube that drains pee from the bladder, take care of it as told by your doctor.  Pay attention to any changes in your symptoms. Tell your doctor about them. This information is not intended to replace advice given to you by your  health care provider. Make sure you discuss any questions you have with your health care provider. Document Revised: 12/27/2016 Document Reviewed: 02/16/2016 Elsevier Patient Education  2020 [employer]  

## 2019-12-04 LAB — URINE CULTURE

## 2019-12-06 LAB — URINE CULTURE

## 2019-12-07 ENCOUNTER — Other Ambulatory Visit (INDEPENDENT_AMBULATORY_CARE_PROVIDER_SITE_OTHER): Payer: Self-pay | Admitting: Primary Care

## 2019-12-07 MED ORDER — FLUCONAZOLE 150 MG PO TABS: 150.0000 mg | ORAL_TABLET | Freq: Once | ORAL | 0 refills | Status: AC

## 2019-12-07 MED ORDER — NITROFURANTOIN MONOHYD MACRO 100 MG PO CAPS: 100.0000 mg | ORAL_CAPSULE | Freq: Two times a day (BID) | ORAL | 0 refills | Status: DC

## 2020-07-04 ENCOUNTER — Other Ambulatory Visit: Payer: Self-pay

## 2020-07-04 ENCOUNTER — Encounter (HOSPITAL_BASED_OUTPATIENT_CLINIC_OR_DEPARTMENT_OTHER): Payer: Self-pay | Admitting: *Deleted

## 2020-07-04 ENCOUNTER — Emergency Department (HOSPITAL_BASED_OUTPATIENT_CLINIC_OR_DEPARTMENT_OTHER)
Admission: EM | Admit: 2020-07-04 | Discharge: 2020-07-04 | Disposition: A | Discharge status: Home / Self Care | Payer: Medicaid Other | Attending: Emergency Medicine | Admitting: Emergency Medicine

## 2020-07-04 DIAGNOSIS — Z20822 Contact with and (suspected) exposure to covid-19: Secondary | ICD-10-CM | POA: Diagnosis not present

## 2020-07-04 DIAGNOSIS — R200 Anesthesia of skin: Secondary | ICD-10-CM

## 2020-07-04 DIAGNOSIS — I10 Essential (primary) hypertension: Secondary | ICD-10-CM | POA: Diagnosis not present

## 2020-07-04 DIAGNOSIS — Z79899 Other long term (current) drug therapy: Secondary | ICD-10-CM | POA: Insufficient documentation

## 2020-07-04 DIAGNOSIS — R202 Paresthesia of skin: Secondary | ICD-10-CM | POA: Insufficient documentation

## 2020-07-04 DIAGNOSIS — Z28310 Unvaccinated for covid-19: Secondary | ICD-10-CM | POA: Diagnosis not present

## 2020-07-04 DIAGNOSIS — K0889 Other specified disorders of teeth and supporting structures: Secondary | ICD-10-CM | POA: Diagnosis not present

## 2020-07-04 LAB — BASIC METABOLIC PANEL
Anion gap: 12 (ref 5–15)
BUN: 5 mg/dL — ABNORMAL LOW (ref 6–20)
CO2: 26 mmol/L (ref 22–32)
Calcium: 8.9 mg/dL (ref 8.9–10.3)
Chloride: 97 mmol/L — ABNORMAL LOW (ref 98–111)
Creatinine, Ser: 0.52 mg/dL (ref 0.44–1.00)
GFR, Estimated: >60 mL/min (ref >60)
Glucose, Bld: 105 mg/dL — ABNORMAL HIGH (ref 70–99)
Potassium: 3.7 mmol/L (ref 3.5–5.1)
Sodium: 135 mmol/L (ref 135–145)

## 2020-07-04 LAB — CBC WITH DIFFERENTIAL/PLATELET
Abs Immature Granulocytes: 0.01 10*3/uL (ref 0.00–0.07)
Basophils Absolute: 0 10*3/uL (ref 0.0–0.1)
Basophils Relative: 0 %
Eosinophils Absolute: 0.1 10*3/uL (ref 0.0–0.5)
Eosinophils Relative: 1 %
HCT: 39.7 % (ref 36.0–46.0)
Hemoglobin: 14.3 g/dL (ref 12.0–15.0)
Immature Granulocytes: 0 %
Lymphocytes Relative: 56 %
Lymphs Abs: 4.1 10*3/uL — ABNORMAL HIGH (ref 0.7–4.0)
MCH: 44.1 pg — ABNORMAL HIGH (ref 26.0–34.0)
MCHC: 36 g/dL (ref 30.0–36.0)
MCV: 122.5 fL — ABNORMAL HIGH (ref 80.0–100.0)
Monocytes Absolute: 0.8 10*3/uL (ref 0.1–1.0)
Monocytes Relative: 10 %
Neutro Abs: 2.5 10*3/uL (ref 1.7–7.7)
Neutrophils Relative %: 33 %
Platelets: 283 10*3/uL (ref 150–400)
RBC: 3.24 MIL/uL — ABNORMAL LOW (ref 3.87–5.11)
RDW: 12.7 % (ref 11.5–15.5)
Smear Review: NORMAL
WBC: 7.5 10*3/uL (ref 4.0–10.5)
nRBC: 0 % (ref 0.0–0.2)

## 2020-07-04 LAB — PREGNANCY, URINE: Preg Test, Ur: NEGATIVE

## 2020-07-04 LAB — RESP PANEL BY RT-PCR (FLU A&B, COVID) ARPGX2
Influenza A by PCR: NEGATIVE
Influenza B by PCR: NEGATIVE
SARS Coronavirus 2 by RT PCR: NEGATIVE

## 2020-07-04 LAB — MAGNESIUM: Magnesium: 1.5 mg/dL — ABNORMAL LOW (ref 1.7–2.4)

## 2020-07-04 MED ORDER — MAGNESIUM OXIDE -MG SUPPLEMENT 400 (240 MG) MG PO TABS
400.0000 mg | ORAL_TABLET | Freq: Once | ORAL | Status: AC
  Administered 2020-07-04: 400 mg via ORAL
  Filled 2020-07-04: qty 1

## 2020-07-04 NOTE — ED Provider Notes (Signed)
[facility] EMERGENCY DEPARTMENT Provider Note   CSN: [id number] Arrival date & time: 07/04/20  1525     History Chief Complaint  Patient presents with  . mouth  numbness    No taste     Tanya Carroll is a 35 y.o. female with a past medical history significant for hypertension who presents to the ED due to perioral paresthesias that started around 5PM. Patient states the outside and inside of her mouth have been persistently numb since last night. Numbness associated with difficulties speaking which patient describes as having difficulties forming words. She notes she has had a similar episode of perioral paresthesias and was evaluated previously and discharged with prednisone. 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 Patient also states she began to have upper and lower dental pain that started earlier this morning.  Paresthesias associated with inability to taste.  Patient denies visual changes, dizziness, unilateral weakness.  Denies fever and chills.  Denies sick contacts or known COVID exposures.  She is currently unvaccinated against COVID-19.  No family history of MS.  Patient denies associated cheek edema.  Patient states she is allergic to citrus however, denies any citrus ingestion.  No new medications or products.  Patient denies difficulty swallowing or difficulties breathing.    History obtained from patient and past medical records. No interpreter used during encounter.      Past Medical History:  Diagnosis Date  . Hypertension   . Placenta previa     There are no problems to display for this patient.   History reviewed. No pertinent surgical history.   OB History    Gravida  1   Para  1   Term  1   Preterm      AB      Living        SAB      IAB      Ectopic      Multiple      Live Births              Family History  Problem  Relation Age of Onset  . Hypertension Mother   . Hypertension Sister     Social History   Tobacco Use  . Smoking status: Never Smoker  . Smokeless tobacco: Never Used  Substance Use Topics  . Alcohol use: Yes    Alcohol/week: 2.0 standard drinks    Types: 2 Shots of liquor per week    Comment: Once or twice a week.  . Drug use: No    Home Medications Prior to Admission medications   Medication Sig Start Date End Date Taking? Authorizing Provider  amLODipine (NORVASC) 10 MG tablet Take 1 tablet (10 mg total) by mouth daily. 07/01/19   Grayce Sessions, NP  losartan-hydrochlorothiazide (HYZAAR) 100-25 MG tablet Take 1 tablet by mouth daily. 07/01/19   Grayce Sessions, NP  nitrofurantoin, macrocrystal-monohydrate, (MACROBID) 100 MG capsule Take 1 capsule (100 mg total) by mouth 2 (two) times daily. 12/07/19   Grayce Sessions, NP  phenazopyridine (PYRIDIUM) 100 MG tablet Take 1 tablet (100 mg total) by mouth 3 (three) times daily as needed for pain. 11/29/19   Grayce Sessions, NP  potassium chloride (KLOR-CON) 10 MEQ tablet TAKE 1 TABLET(10 MEQ) BY MOUTH DAILY Patient taking differently: Take 10 mEq by mouth daily.  08/28/19   Grayce Sessions, NP  Allergies    Citrus  Review of Systems   Review of Systems  Constitutional: Negative for chills and fever.  Eyes: Negative for visual disturbance.  Neurological: Positive for speech difficulty and numbness. Negative for dizziness, facial asymmetry, weakness and headaches.  All other systems reviewed and are negative.   Physical Exam Updated Vital Signs BP 103/69 (BP Location: Right Arm)   Pulse (!) 101   Temp 98.1 F (36.7 C) (Oral)   Resp 18   Ht 5\' 4"  (1.626 m)   Wt 78.5 kg   SpO2 100%   BMI 29.70 kg/m   Physical Exam Vitals and nursing note reviewed.  Constitutional:      General: She is not in acute distress.    Appearance: She is not ill-appearing.  HENT:     Head: Normocephalic.  Eyes:     Pupils:  Pupils are equal, round, and reactive to light.  Cardiovascular:     Rate and Rhythm: Normal rate and regular rhythm.     Pulses: Normal pulses.     Heart sounds: Normal heart sounds. No murmur heard. No friction rub. No gallop.   Pulmonary:     Effort: Pulmonary effort is normal.     Breath sounds: Normal breath sounds.  Abdominal:     General: Abdomen is flat. There is no distension.     Palpations: Abdomen is soft.     Tenderness: There is no abdominal tenderness. There is no guarding or rebound.  Musculoskeletal:        General: Normal range of motion.     Cervical back: Neck supple.  Skin:    General: Skin is warm and dry.  Neurological:     General: No focal deficit present.     Mental Status: She is alert.     Comments: No sensation around mouth; however full sensation to forehead. No facial droop, no slurred speech. CN III-XII intact Normal strength in upper and lower extremities bilaterally including dorsiflexion and plantar flexion, strong and equal grip strength Sensation grossly intact throughout Moves extremities without ataxia, coordination intact No pronator drift   Psychiatric:        Mood and Affect: Mood normal.        Behavior: Behavior normal.     ED Results / Procedures / Treatments   Labs (all labs ordered are listed, but only abnormal results are displayed) Labs Reviewed  CBC WITH DIFFERENTIAL/PLATELET - Abnormal; Notable for the following components:      Result Value   RBC 3.24 (*)    MCV 122.5 (*)    MCH 44.1 (*)    Lymphs Abs 4.1 (*)    All other components within normal limits  BASIC METABOLIC PANEL - Abnormal; Notable for the following components:   Chloride 97 (*)    Glucose, Bld 105 (*)    BUN 5 (*)    All other components within normal limits  MAGNESIUM - Abnormal; Notable for the following components:   Magnesium 1.5 (*)    All other components within normal limits  RESP PANEL BY RT-PCR (FLU A&B, COVID) ARPGX2  PREGNANCY, URINE   VITAMIN B12    EKG None  Radiology No results found.  Procedures Procedures   Medications Ordered in ED Medications  magnesium oxide (MAG-OX) tablet 400 mg (400 mg Oral Given 07/04/20 1932)    ED Course  I have reviewed the triage vital signs and the nursing notes.  Pertinent labs & imaging results that were available during  my care of the patient were reviewed by me and considered in my medical decision making (see chart for details).  Clinical Course as of 07/04/20 1950  Tue Jul 04, 2020  1907 Preg Test, Ur: NEGATIVE [CA]    Clinical Course User Index [CA] Mannie Stabile, PA-C   MDM Rules/Calculators/A&P                         35 year old female presents to the ED due to perioral paresthesias that started at 5 PM last night.  History of same.  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 No family history of MS.  Upon arrival, stable vitals.  Patient mildly hypertensive at 148/106. Physical exam significant for no sensation around mouth; however, otherwise normal neurological exam. No signs of dental infection. Airway patent. No angioedema. Labs ordered to rule out electrolyte abnormalities. B12 to rule out deficiency. Discussed case with Dr. Criss Alvine who agrees with assessment and plan.   Hypomagnesemia at 1.5. Magnesium repleted here in the ED. CBC reassuring with no leukocytosis and normal hemoglobin. COVID/influenza negative. BMP reassuring. Normal potassium. B12 pending (send out lab). Discussed case with Dr. Amada Jupiter with neurology who recommends outpatient neurology follow-up. Patient stable for discharged. Low suspicion for CVA given bilateral paraesthesias.  Strict ED precautions discussed with patient. Patient states understanding and agrees to plan. Patient discharged home in no acute distress and stable vitals. Final Clinical Impression(s) / ED  Diagnoses Final diagnoses:  Numbness and tingling    Rx / DC Orders ED Discharge Orders    None       Jesusita Oka 07/04/20 1951    Pricilla Loveless, MD 07/05/20 [phone]

## 2020-07-04 NOTE — Discharge Instructions (Addendum)
It was a pleasure taking care of you today. As discussed, your magnesium was slightly lower than normal. You were given magnesium here in the ER. All of your other labs were reassuring. Your B12 lab is pending. Please follow-up with your neurologist within the next week for further evaluation. Return to the ER for new or worsening symptoms.

## 2020-07-04 NOTE — ED Triage Notes (Signed)
C/o no taste and mouth numbness x 2 days c/o teeth pain , no facial droop and difficulty swallowing

## 2020-07-04 NOTE — ED Notes (Addendum)
Attempted to get IV X 2, tech asked to obtain blood work

## 2020-07-04 NOTE — ED Notes (Signed)
Mouth numbness and pain x 2 days. No taste  States she took a covid test last night and was negative

## 2020-07-17 ENCOUNTER — Encounter (HOSPITAL_COMMUNITY): Payer: Self-pay | Admitting: Emergency Medicine

## 2020-07-17 ENCOUNTER — Emergency Department (HOSPITAL_COMMUNITY)
Admission: EM | Admit: 2020-07-17 | Discharge: 2020-07-17 | Disposition: A | Discharge status: Home / Self Care | Payer: Medicaid Other | Attending: Emergency Medicine | Admitting: Emergency Medicine

## 2020-07-17 ENCOUNTER — Emergency Department (HOSPITAL_COMMUNITY): Payer: Medicaid Other

## 2020-07-17 ENCOUNTER — Other Ambulatory Visit: Payer: Self-pay

## 2020-07-17 DIAGNOSIS — R202 Paresthesia of skin: Secondary | ICD-10-CM | POA: Insufficient documentation

## 2020-07-17 DIAGNOSIS — R2689 Other abnormalities of gait and mobility: Secondary | ICD-10-CM | POA: Diagnosis not present

## 2020-07-17 DIAGNOSIS — I10 Essential (primary) hypertension: Secondary | ICD-10-CM | POA: Insufficient documentation

## 2020-07-17 DIAGNOSIS — Z79899 Other long term (current) drug therapy: Secondary | ICD-10-CM | POA: Diagnosis not present

## 2020-07-17 LAB — COMPREHENSIVE METABOLIC PANEL
ALT: 52 U/L — ABNORMAL HIGH (ref 0–44)
AST: 100 U/L — ABNORMAL HIGH (ref 15–41)
Albumin: 3.6 g/dL (ref 3.5–5.0)
Alkaline Phosphatase: 106 U/L (ref 38–126)
Anion gap: 8 (ref 5–15)
BUN: 8 mg/dL (ref 6–20)
CO2: 28 mmol/L (ref 22–32)
Calcium: 9.1 mg/dL (ref 8.9–10.3)
Chloride: 102 mmol/L (ref 98–111)
Creatinine, Ser: 0.65 mg/dL (ref 0.44–1.00)
GFR, Estimated: >60 mL/min (ref >60)
Glucose, Bld: 119 mg/dL — ABNORMAL HIGH (ref 70–99)
Potassium: 3.9 mmol/L (ref 3.5–5.1)
Sodium: 138 mmol/L (ref 135–145)
Total Bilirubin: 1.8 mg/dL — ABNORMAL HIGH (ref 0.3–1.2)
Total Protein: 7.6 g/dL (ref 6.5–8.1)

## 2020-07-17 LAB — CBC WITH DIFFERENTIAL/PLATELET
Abs Immature Granulocytes: 0.01 10*3/uL (ref 0.00–0.07)
Basophils Absolute: 0 10*3/uL (ref 0.0–0.1)
Basophils Relative: 0 %
Eosinophils Absolute: 0 10*3/uL (ref 0.0–0.5)
Eosinophils Relative: 1 %
HCT: 41.4 % (ref 36.0–46.0)
Hemoglobin: 14.5 g/dL (ref 12.0–15.0)
Immature Granulocytes: 0 %
Lymphocytes Relative: 37 %
Lymphs Abs: 2.3 10*3/uL (ref 0.7–4.0)
MCH: 43.2 pg — ABNORMAL HIGH (ref 26.0–34.0)
MCHC: 35 g/dL (ref 30.0–36.0)
MCV: 123.2 fL — ABNORMAL HIGH (ref 80.0–100.0)
Monocytes Absolute: 0.7 10*3/uL (ref 0.1–1.0)
Monocytes Relative: 11 %
Neutro Abs: 3.2 10*3/uL (ref 1.7–7.7)
Neutrophils Relative %: 51 %
Platelets: 167 10*3/uL (ref 150–400)
RBC: 3.36 MIL/uL — ABNORMAL LOW (ref 3.87–5.11)
RDW: 12 % (ref 11.5–15.5)
WBC: 6.3 10*3/uL (ref 4.0–10.5)
nRBC: 0 % (ref 0.0–0.2)

## 2020-07-17 LAB — TSH: TSH: 2.824 u[IU]/mL (ref 0.350–4.500)

## 2020-07-17 LAB — SEDIMENTATION RATE: Sed Rate: 2 mm/hr (ref 0–22)

## 2020-07-17 LAB — VITAMIN B12: Vitamin B-12: 136 pg/mL — ABNORMAL LOW (ref 180–914)

## 2020-07-17 LAB — MAGNESIUM: Magnesium: 2 mg/dL (ref 1.7–2.4)

## 2020-07-17 LAB — HIV ANTIBODY (ROUTINE TESTING W REFLEX): HIV Screen 4th Generation wRfx: NONREACTIVE

## 2020-07-17 IMAGING — MR MR CERVICAL SPINE WO/W CM
22 series · 45 of 48 positions shown · IV contrast (gadavist)
Comparison: None.

CLINICAL DATA: Unsteady gait, numbness

EXAM:
MRI CERVICAL SPINE WITHOUT AND WITH CONTRAST
TECHNIQUE: Multiplanar and multiecho pulse sequences of the cervical spine, to
include the craniocervical junction and cervicothoracic junction,
were obtained without and with intravenous contrast.
CONTRAST:  8mL GADAVIST GADOBUTROL 1 MMOL/ML IV SOLN

[Series 5: DWI · axial · 3.0mm · 1.36mm/px · z∈[-27,+113]mm · 4 of 96 slices shown (1 of 2)]
[im 1/96]
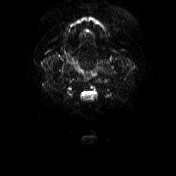
[im 32/96]
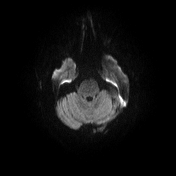
[im 64/96]
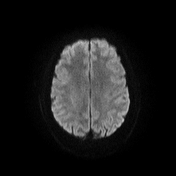
[im 96/96]
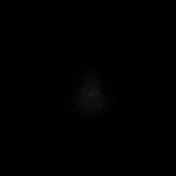

[Series 6: DWI · axial · 3.0mm · 1.36mm/px · z∈[-27,+113]mm · 2 of 48 slices shown (2 of 2)]
[im 1/48]
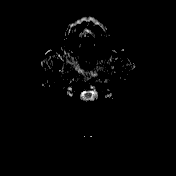
[im 48/48]
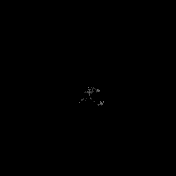

[Series 7: T1 · sagittal · 5.0mm · 0.75mm/px · 1 of 22 slices shown (1 of 4)]
[im 1/22]
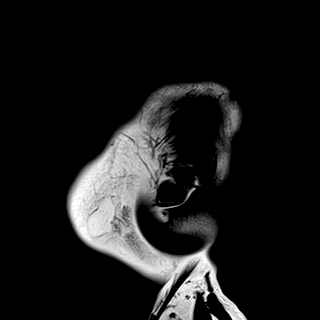

[Series 8: T2 · axial · 5.0mm · 0.62mm/px · 1 of 21 slices shown (1 of 3)]
[im 1/21]
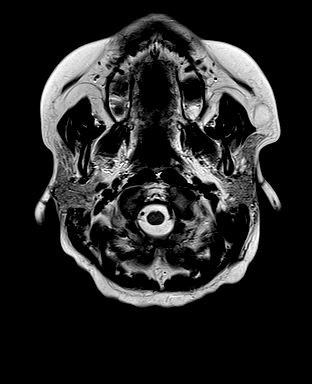

[Series 9: swi_images · axial · 3.0mm · 0.75mm/px · z∈[-18,+110]mm · 2 of 44 slices shown]
[im 1/44]
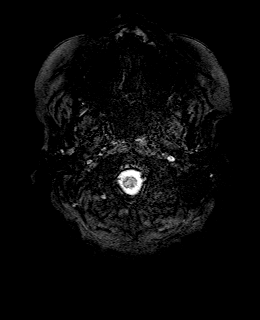
[im 44/44]
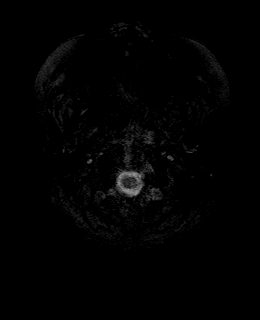

[Series 11: FLAIR · axial · 3.0mm · 0.75mm/px · z∈[-24,+116]mm · 2 of 48 slices shown (1 of 2)]
[im 1/48]
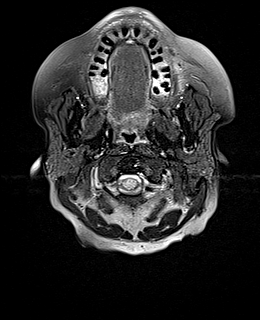
[im 48/48]
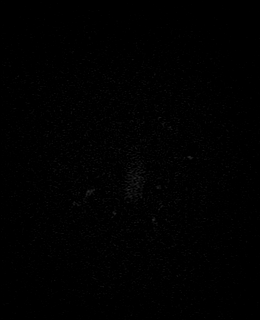

[Series 12: T1 · axial · 1.0mm · 0.94mm/px · z∈[-25,+117]mm · 7 of 144 slices shown (2 of 4)]
[im 1/144]
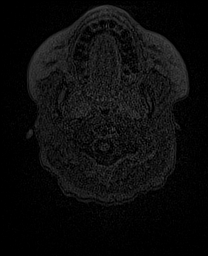
[im 24/144]
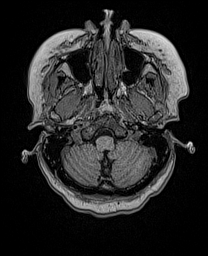
[im 48/144]
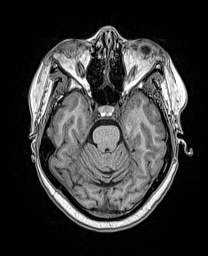
[im 72/144]
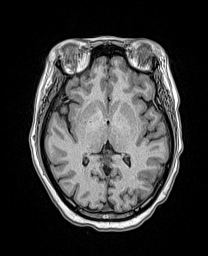
[im 96/144]
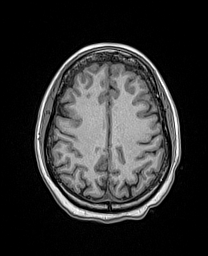
[im 120/144]
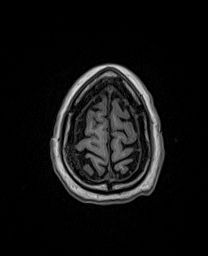
[im 144/144]
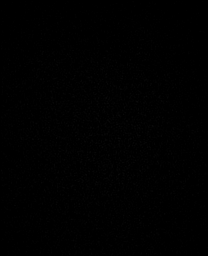

[Series 13: cor dwi_tracew · coronal · 5.0mm · 1.53mm/px · 2 of 46 slices shown]
[im 1/46]
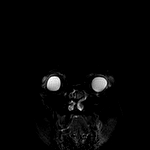
[im 46/46]
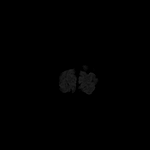

[Series 14: cor dwi_adc · coronal · 5.0mm · 1.53mm/px · 1 of 23 slices shown]
[im 1/23]
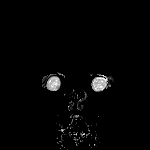

[Series 15: T2 · coronal · 5.0mm · 0.57mm/px · 1 of 28 slices shown (2 of 3)]
[im 1/28]
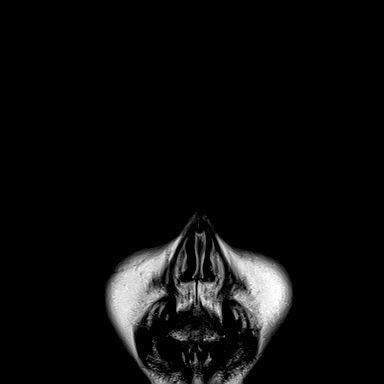

[Series 20: T1 · sagittal · 3.0mm · 0.69mm/px · 1 of 15 slices shown (3 of 4)]
[im 1/15]
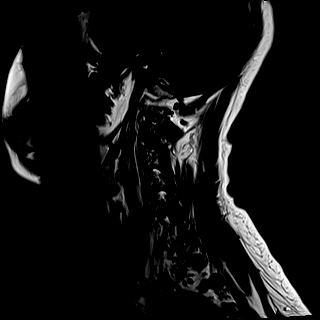

[Series 21: STIR · sagittal · 3.0mm · 0.86mm/px · 1 of 15 slices shown]
[im 1/15]
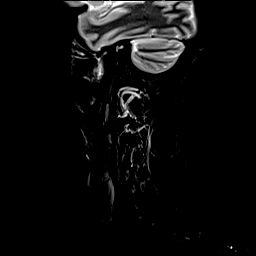

[Series 22: T2 · axial · 3.0mm · 0.70mm/px · z∈[-174,-74]mm · 2 of 32 slices shown (3 of 3)]
[im 1/32]
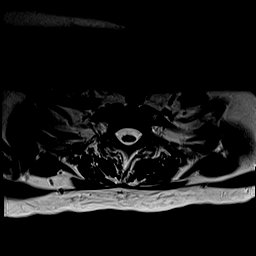
[im 32/32]
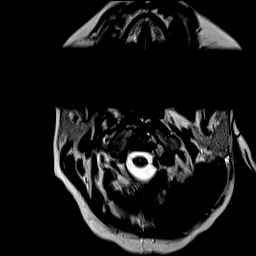

[Series 23: GRE · axial · 3.0mm · 0.35mm/px · z∈[-174,-74]mm · 2 of 32 slices shown]
[im 1/32]
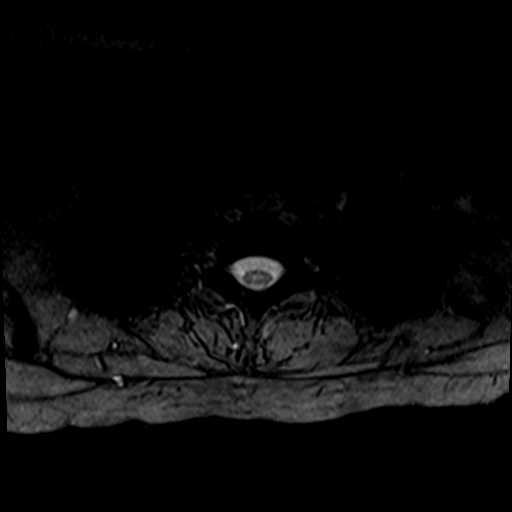
[im 32/32]
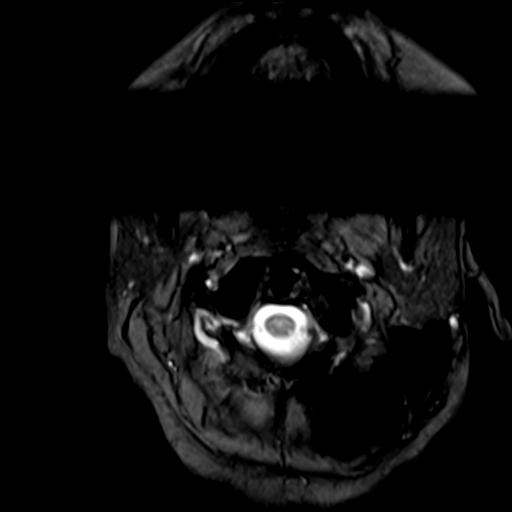

[Series 24: T1 · axial · 3.0mm · 0.35mm/px · z∈[-174,-74]mm · 2 of 32 slices shown (4 of 4)]
[im 1/32]
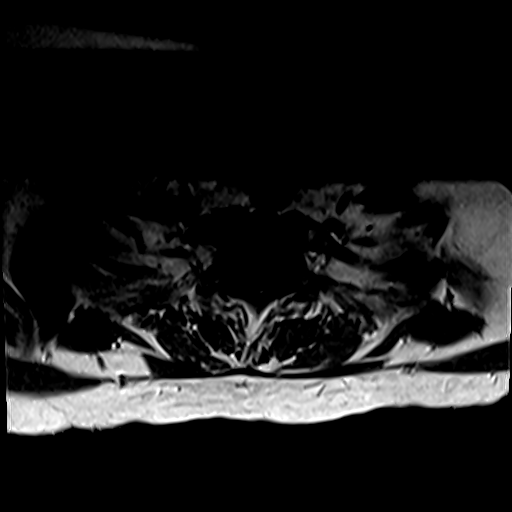
[im 32/32]
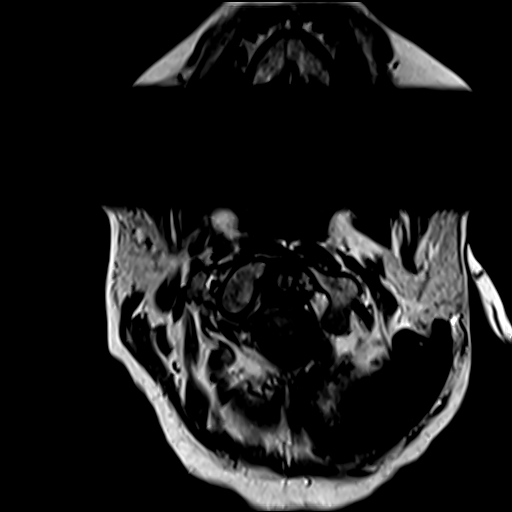

[Series 25: FLAIR · sagittal · 1.1mm · 0.50mm/px · 2 of 112 slices shown (2 of 2)]
[im 1/112]
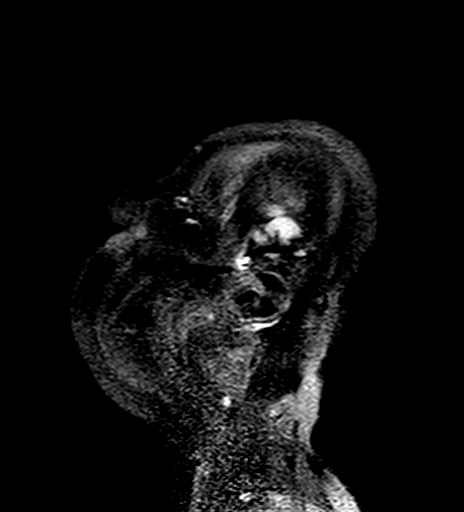
[im 28/112]
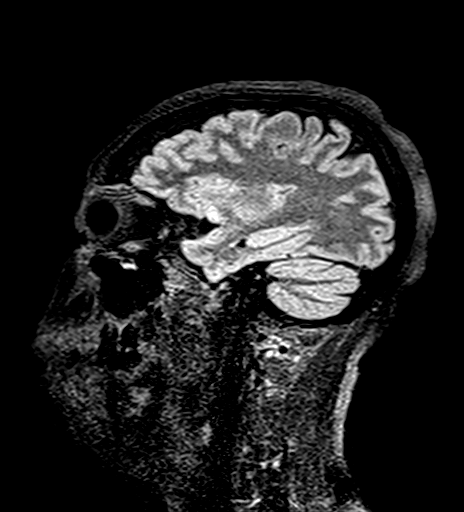

[Series 26: T2 post-contrast · sagittal · 3.0mm · 0.69mm/px · 1 of 15 slices shown]
[im 1/15]
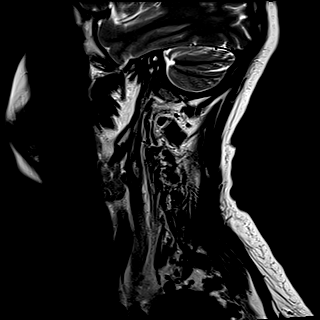

[Series 27: T1 fat-sat post-contrast · sagittal · 3.0mm · 0.69mm/px · 1 of 15 slices shown]
[im 1/15]
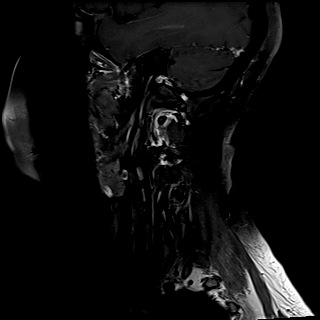

[Series 28: T1 post-contrast · axial · 3.0mm · 0.35mm/px · z∈[-174,-74]mm · 2 of 32 slices shown (1 of 4)]
[im 1/32]
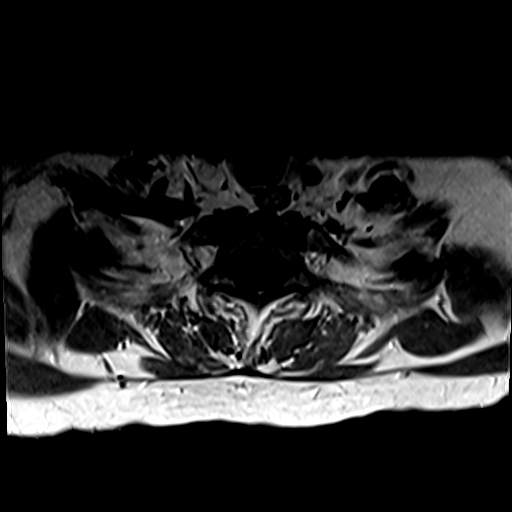
[im 32/32]
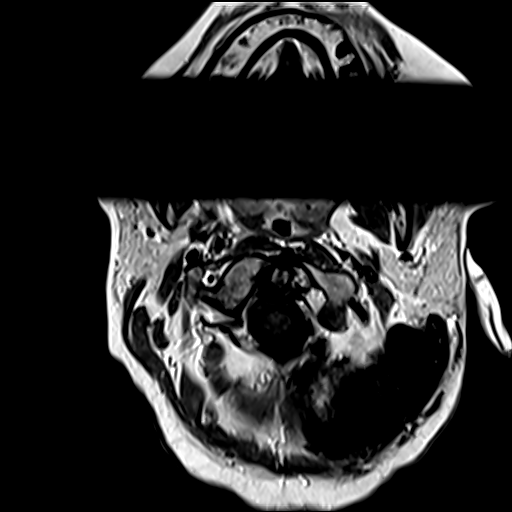

[Series 33: T1 post-contrast · axial · 1.0mm · 0.94mm/px · z∈[-17,+109]mm · 6 of 128 slices shown (2 of 4)]
[im 1/128]
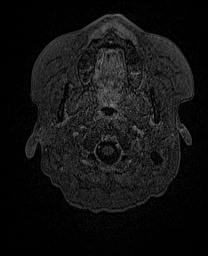
[im 26/128]
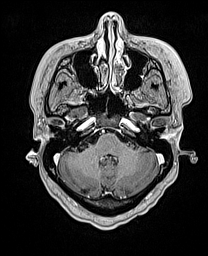
[im 51/128]
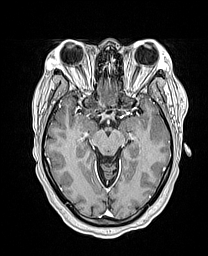
[im 77/128]
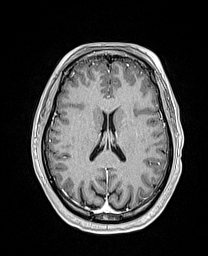
[im 102/128]
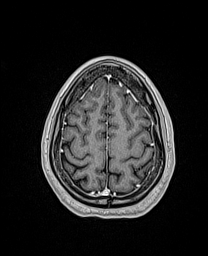
[im 128/128]
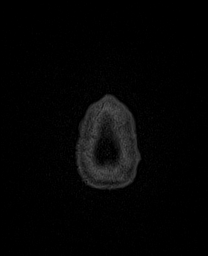

[Series 34: T1 post-contrast · coronal · 5.0mm · 0.43mm/px · 1 of 24 slices shown (3 of 4)]
[im 1/24]
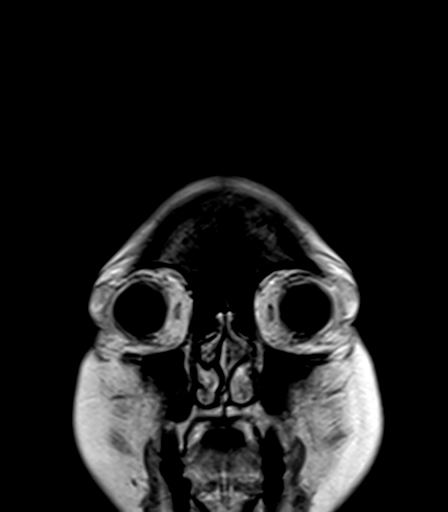

[Series 35: T1 post-contrast · sagittal · 5.0mm · 0.75mm/px · 1 of 24 slices shown (4 of 4)]
[im 1/24]
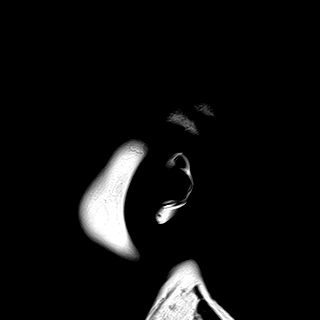

[45 of 48 positions shown; findings below may reference images not displayed]

FINDINGS: Alignment: No significant listhesis.

Vertebrae: Vertebral body heights are maintained. There is no marrow
edema. No suspicious osseous lesion.

Cord: Normal caliber and signal.  No abnormal enhancement.

Posterior Fossa, vertebral arteries, paraspinal tissues:
Unremarkable.

Disc levels:

C2-C3:  No canal or foraminal stenosis.

C3-C4: Left greater than right uncovertebral hypertrophy. No canal
stenosis. Minor right and moderate left foraminal stenosis.

C4-C5: Right foraminal disc osteophyte complex. No canal or left
foraminal stenosis. Moderate to marked right foraminal stenosis.

C5-C6: Disc osteophyte complex slightly eccentric to the left
indents the left ventral thecal sac. Minor canal stenosis. No
foraminal stenosis.

C6-C7: Disc osteophyte complex. Uncovertebral hypertrophy. No canal
stenosis. Mild foraminal stenosis.

C7-T1:  No canal or foraminal stenosis.
IMPRESSION: No abnormal cord signal or enhancement.

Degenerative changes as detailed above. No high-grade canal
narrowing. Foraminal narrowing is greatest on the right at C4-C5.

## 2020-07-17 IMAGING — MR MR HEAD WO/W CM
17 of 22 series · 40 of 48 positions shown · IV contrast (gadavist)
Comparison: None.

CLINICAL DATA: Unsteady gait, numbness

EXAM:
MRI HEAD WITHOUT AND WITH CONTRAST
TECHNIQUE: Multiplanar, multiecho pulse sequences of the brain and surrounding
structures were obtained without and with intravenous contrast.
CONTRAST:  8mL GADAVIST GADOBUTROL 1 MMOL/ML IV SOLN

[Series 5: DWI · axial · 3.0mm · 1.36mm/px · z∈[-27,+113]mm · 4 of 96 slices shown (1 of 2)]
[im 1/96]
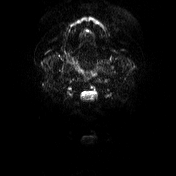
[im 32/96]
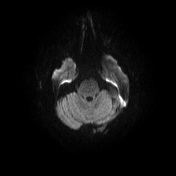
[im 64/96]
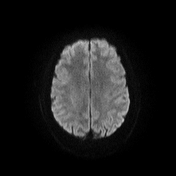
[im 96/96]
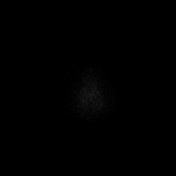

[Series 6: DWI · axial · 3.0mm · 1.36mm/px · z∈[-27,+113]mm · 2 of 48 slices shown (2 of 2)]
[im 1/48]
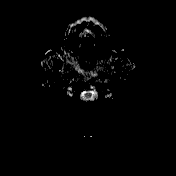
[im 48/48]
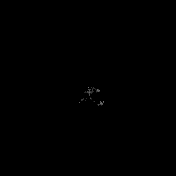

[Series 7: T1 · sagittal · 5.0mm · 0.75mm/px · 1 of 22 slices shown (1 of 4)]
[im 1/22]
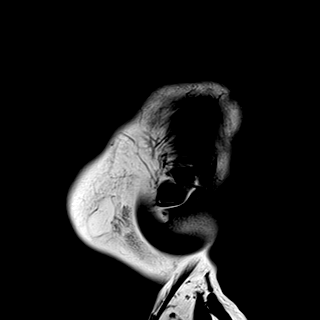

[Series 8: T2 · axial · 5.0mm · 0.62mm/px · 1 of 21 slices shown (1 of 3)]
[im 1/21]
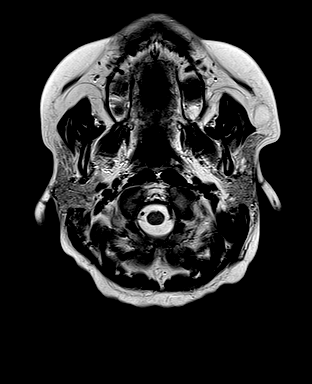

[Series 11: FLAIR · axial · 3.0mm · 0.75mm/px · z∈[-24,+116]mm · 2 of 48 slices shown (1 of 2)]
[im 1/48]
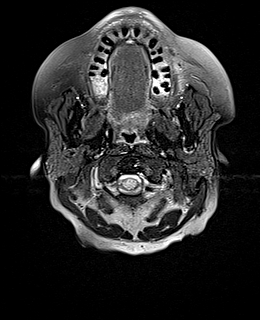
[im 48/48]
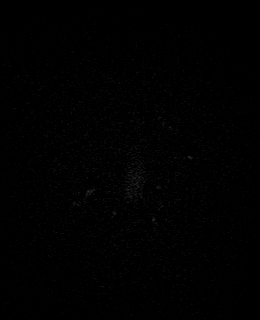

[Series 12: T1 · axial · 1.0mm · 0.94mm/px · z∈[-25,+117]mm · 7 of 144 slices shown (2 of 4)]
[im 1/144]
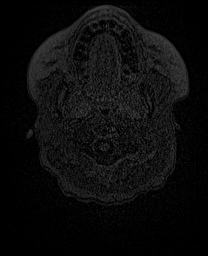
[im 24/144]
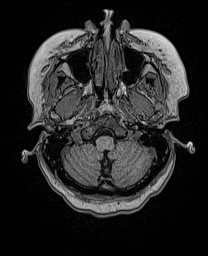
[im 48/144]
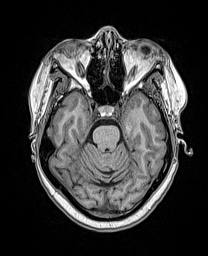
[im 72/144]
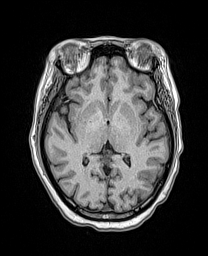
[im 96/144]
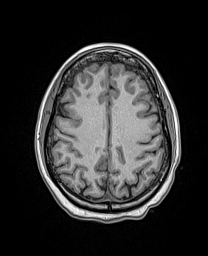
[im 120/144]
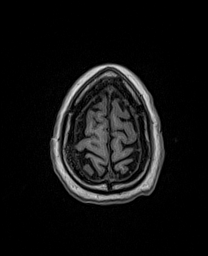
[im 144/144]
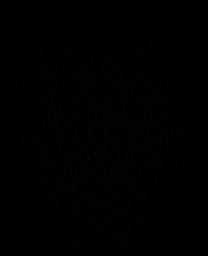

[Series 15: T2 · coronal · 5.0mm · 0.57mm/px · 1 of 28 slices shown (2 of 3)]
[im 1/28]
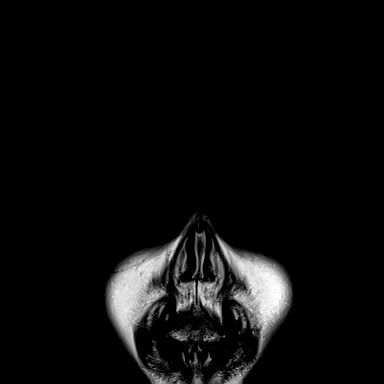

[Series 20: T1 · sagittal · 3.0mm · 0.69mm/px · 1 of 15 slices shown (3 of 4)]
[im 1/15]
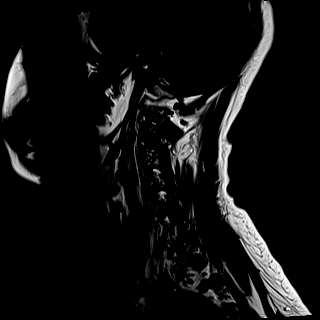

[Series 22: T2 · axial · 3.0mm · 0.70mm/px · z∈[-174,-74]mm · 2 of 32 slices shown (3 of 3)]
[im 1/32]
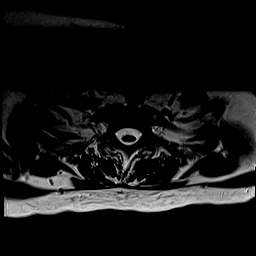
[im 32/32]
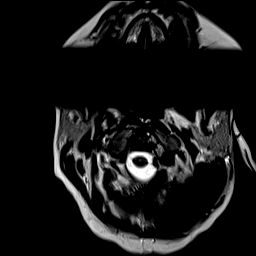

[Series 23: GRE · axial · 3.0mm · 0.35mm/px · z∈[-174,-74]mm · 2 of 32 slices shown]
[im 1/32]
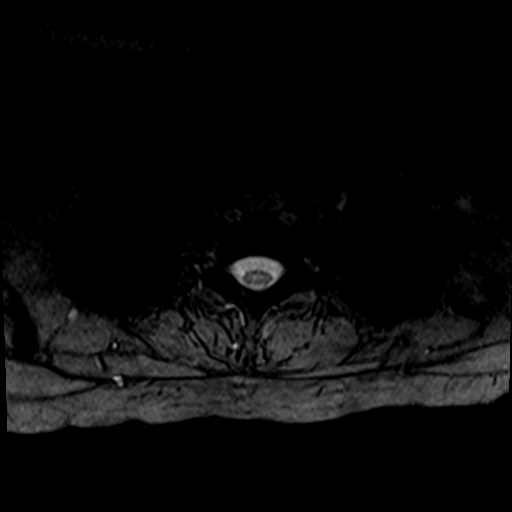
[im 32/32]
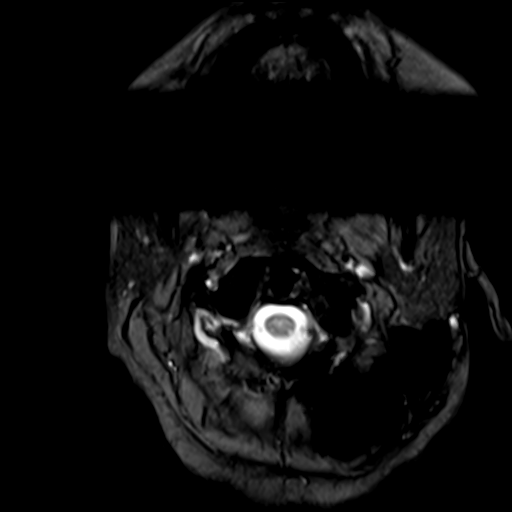

[Series 24: T1 · axial · 3.0mm · 0.35mm/px · 1 of 32 slices shown (4 of 4)]
[im 1/32]
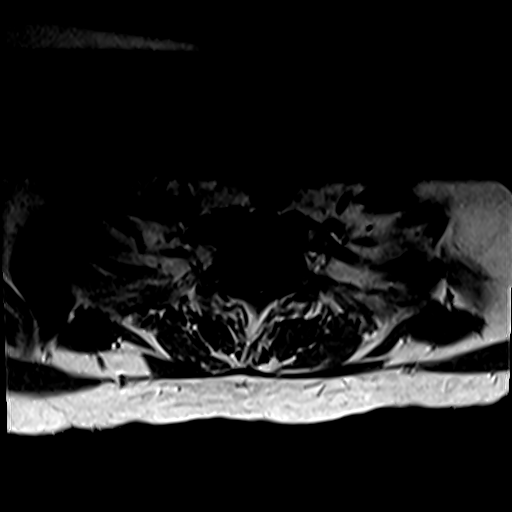

[Series 25: FLAIR · sagittal · 1.1mm · 0.50mm/px · 5 of 112 slices shown (2 of 2)]
[im 1/112]
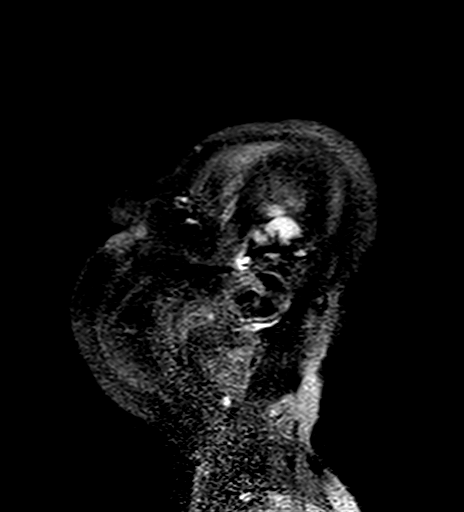
[im 28/112]
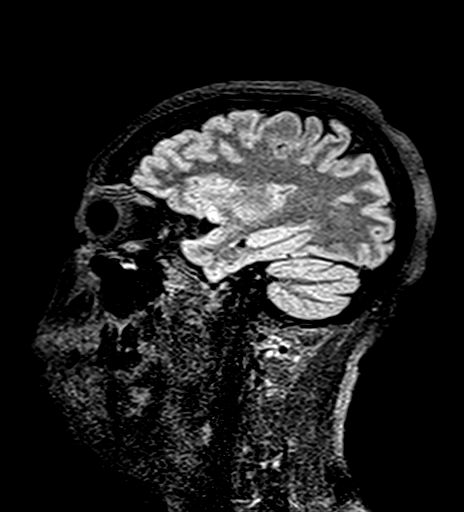
[im 56/112]
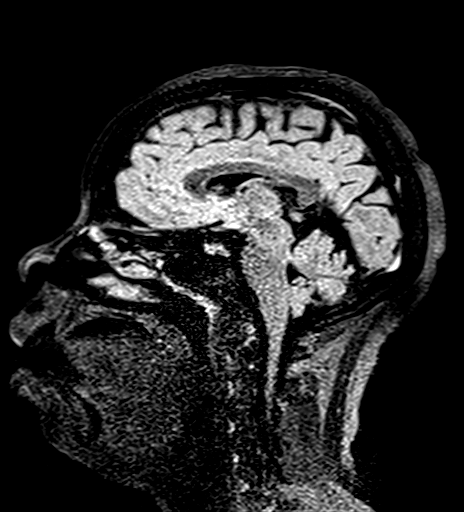
[im 84/112]
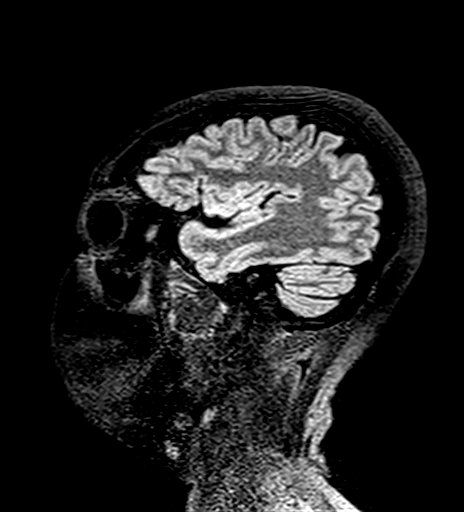
[im 112/112]
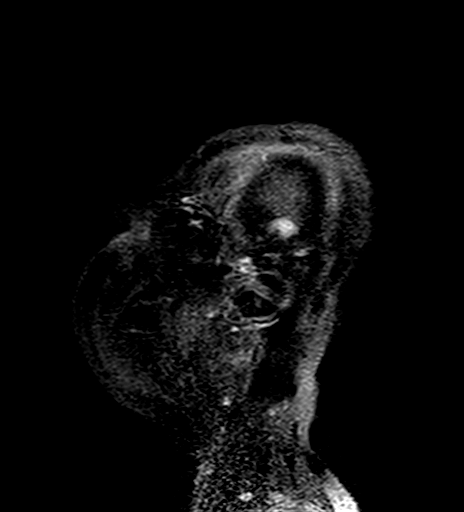

[Series 26: T2 post-contrast · sagittal · 3.0mm · 0.69mm/px · 1 of 15 slices shown]
[im 1/15]
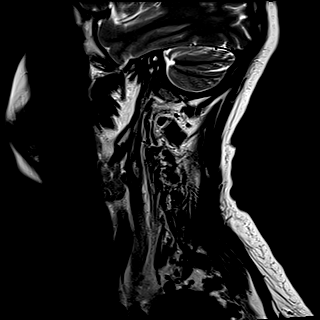

[Series 28: T1 post-contrast · axial · 3.0mm · 0.35mm/px · z∈[-174,-74]mm · 2 of 32 slices shown (1 of 4)]
[im 1/32]
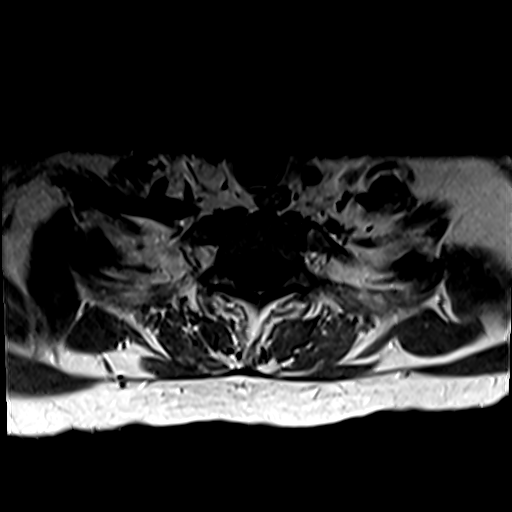
[im 32/32]
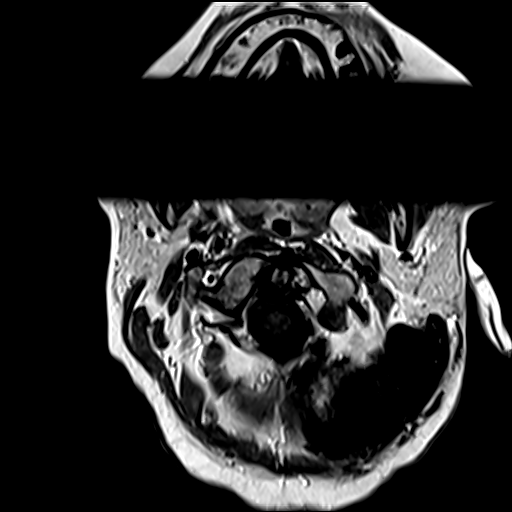

[Series 33: T1 post-contrast · axial · 1.0mm · 0.94mm/px · z∈[-17,+109]mm · 6 of 128 slices shown (2 of 4)]
[im 1/128]
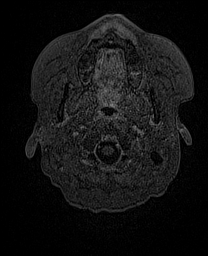
[im 26/128]
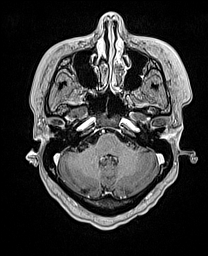
[im 51/128]
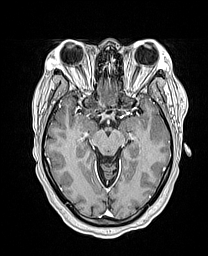
[im 77/128]
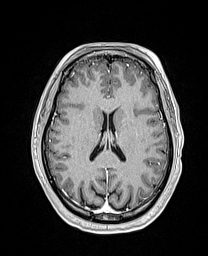
[im 102/128]
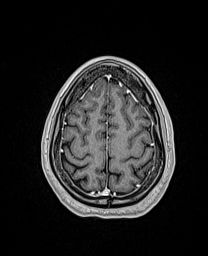
[im 128/128]
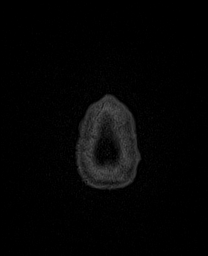

[Series 34: T1 post-contrast · coronal · 5.0mm · 0.43mm/px · 1 of 24 slices shown (3 of 4)]
[im 1/24]
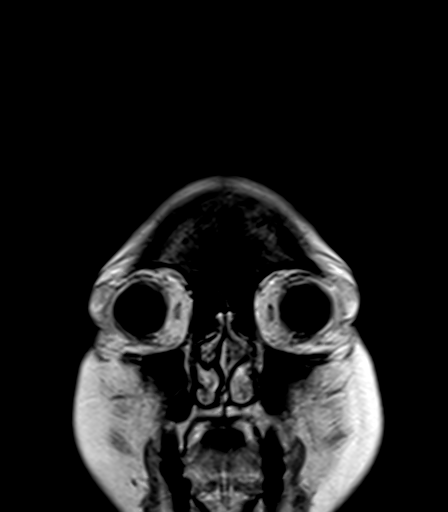

[Series 35: T1 post-contrast · sagittal · 5.0mm · 0.75mm/px · 1 of 24 slices shown (4 of 4)]
[im 1/24]
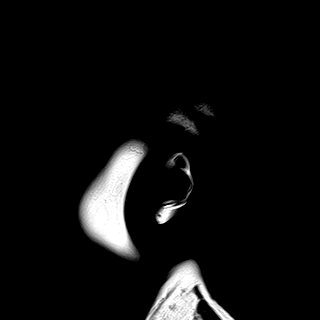

[40 of 48 positions shown; findings below may reference images not displayed]

FINDINGS: Brain: There is no acute infarction or intracranial hemorrhage.
There is no intracranial mass, mass effect, or edema. There is no
hydrocephalus or extra-axial fluid collection. Single nonspecific
focus of T2 hyperintensity is present in the left frontal
subcortical white matter probably reflecting gliosis/demyelination
of questionable significance. Ventricles and sulci are normal in
size and configuration. No abnormal enhancement.

Vascular: Major vessel flow voids at the skull base are preserved.

Skull and upper cervical spine: Normal marrow signal is preserved.

Sinuses/Orbits: Paranasal sinuses are aerated. Orbits are
unremarkable.

Other: Sella is unremarkable.  Mastoid air cells are clear.
IMPRESSION: Single focus of probable nonspecific gliosis/demyelination in the
left frontal subcortical white matter of questionable significance.
Otherwise normal study.

## 2020-07-17 MED ORDER — GADOBUTROL 1 MMOL/ML IV SOLN
7.0000 mL | Freq: Once | INTRAVENOUS | Status: AC | PRN
  Administered 2020-07-17: 8 mL via INTRAVENOUS

## 2020-07-17 MED ORDER — SODIUM CHLORIDE 0.9 % IV BOLUS
1000.0000 mL | Freq: Once | INTRAVENOUS | Status: AC
  Administered 2020-07-17: 1000 mL via INTRAVENOUS

## 2020-07-17 NOTE — ED Provider Notes (Signed)
[facility]-EMERGENCY DEPT Provider Note   CSN: [id number] Arrival date & time: 07/17/20  0901     History Chief Complaint  Patient presents with   Numbness   Off Balance    Tanya Carroll is a 35 y.o. female.  Pt presents to the ED today with feelings of numbness and feeling off balance.  Pt has had these sx for several months.  She did see neurology in March (Dr. Kandis Nab) who recommended several blood tests and an EMG.  However, pt did not get them as she does not have insurance.  Pt presented to [facility] with similar sx on 6/7.  Neurology recommended outpatient f/u.  Pt has not followed up because she can't afford it.  Pt is here today because she has to hold onto the wall to keep her balance.  She feels paresthesias in both arms and legs.  She also has numbness around her mouth.      Past Medical History:  Diagnosis Date   Hypertension    Placenta previa     There are no problems to display for this patient.   No past surgical history on file.   OB History     Gravida  1   Para  1   Term  1   Preterm      AB      Living         SAB      IAB      Ectopic      Multiple      Live Births              Family History  Problem Relation Age of Onset   Hypertension Mother    Hypertension Sister     Social History   Tobacco Use   Smoking status: Never   Smokeless tobacco: Never  Substance Use Topics   Alcohol use: Yes    Alcohol/week: 2.0 standard drinks    Types: 2 Shots of liquor per week    Comment: Once or twice a week.   Drug use: No    Home Medications Prior to Admission medications   Medication Sig Start Date End Date Taking? Authorizing Provider  amLODipine (NORVASC) 10 MG tablet Take 1 tablet (10 mg total) by mouth daily. 07/01/19   Grayce Sessions, NP  losartan-hydrochlorothiazide (HYZAAR) 100-25 MG tablet Take 1 tablet by mouth daily. 07/01/19   Grayce Sessions, NP  nitrofurantoin,  macrocrystal-monohydrate, (MACROBID) 100 MG capsule Take 1 capsule (100 mg total) by mouth 2 (two) times daily. 12/07/19   Grayce Sessions, NP  phenazopyridine (PYRIDIUM) 100 MG tablet Take 1 tablet (100 mg total) by mouth 3 (three) times daily as needed for pain. 11/29/19   Grayce Sessions, NP  potassium chloride (KLOR-CON) 10 MEQ tablet TAKE 1 TABLET(10 MEQ) BY MOUTH DAILY Patient taking differently: Take 10 mEq by mouth daily.  08/28/19   Grayce Sessions, NP    Allergies    Citrus  Review of Systems   Review of Systems  Neurological:  Positive for numbness.  All other systems reviewed and are negative.  Physical Exam Updated Vital Signs BP (!) 150/115   Pulse (!) 121   Resp (!) 22   Ht 5\' 4"  (1.626 m)   Wt 78.5 kg   SpO2 100%   BMI 29.70 kg/m   Physical Exam Vitals and nursing note reviewed.  Constitutional:      Appearance: Normal appearance.  HENT:     Head: Normocephalic and atraumatic.     Right Ear: External ear normal.     Left Ear: External ear normal.     Nose: Nose normal.     Mouth/Throat:     Mouth: Mucous membranes are moist.     Pharynx: Oropharynx is clear.  Eyes:     Extraocular Movements: Extraocular movements intact.     Conjunctiva/sclera: Conjunctivae normal.     Pupils: Pupils are equal, round, and reactive to light.  Cardiovascular:     Rate and Rhythm: Normal rate and regular rhythm.     Pulses: Normal pulses.     Heart sounds: Normal heart sounds.  Pulmonary:     Effort: Pulmonary effort is normal.     Breath sounds: Normal breath sounds.  Abdominal:     General: Abdomen is flat. Bowel sounds are normal.     Palpations: Abdomen is soft.  Musculoskeletal:        General: Normal range of motion.     Cervical back: Normal range of motion and neck supple.  Skin:    General: Skin is warm.     Capillary Refill: Capillary refill takes less than 2 seconds.  Neurological:     General: No focal deficit present.     Mental Status: She  is alert and oriented to person, place, and time.     Comments: Paresthesias mouth, hands, feet  Psychiatric:        Mood and Affect: Mood normal.        Behavior: Behavior normal.    ED Results / Procedures / Treatments   Labs (all labs ordered are listed, but only abnormal results are displayed) Labs Reviewed  COMPREHENSIVE METABOLIC PANEL - Abnormal; Notable for the following components:      Result Value   Glucose, Bld 119 (*)    AST 100 (*)    ALT 52 (*)    Total Bilirubin 1.8 (*)    All other components within normal limits  CBC WITH DIFFERENTIAL/PLATELET - Abnormal; Notable for the following components:   RBC 3.36 (*)    MCV 123.2 (*)    MCH 43.2 (*)    All other components within normal limits  MAGNESIUM  TSH  SEDIMENTATION RATE  URINALYSIS, ROUTINE W REFLEX MICROSCOPIC  PREGNANCY, URINE  HEMOGLOBIN A1C  VITAMIN B12  RPR  PROTEIN ELECTROPHORESIS, SERUM  HIV ANTIBODY (ROUTINE TESTING W REFLEX)    EKG None  Radiology MR Brain W and Wo Contrast  Result Date: 07/17/2020 CLINICAL DATA:  Unsteady gait, numbness EXAM: MRI HEAD WITHOUT AND WITH CONTRAST TECHNIQUE: Multiplanar, multiecho pulse sequences of the brain and surrounding structures were obtained without and with intravenous contrast. CONTRAST:  28mL GADAVIST GADOBUTROL 1 MMOL/ML IV SOLN COMPARISON:  None. FINDINGS: Brain: There is no acute infarction or intracranial hemorrhage. There is no intracranial mass, mass effect, or edema. There is no hydrocephalus or extra-axial fluid collection. Single nonspecific focus of T2 hyperintensity is present in the left frontal subcortical white matter probably reflecting gliosis/demyelination of questionable significance. Ventricles and sulci are normal in size and configuration. No abnormal enhancement. Vascular: Major vessel flow voids at the skull base are preserved. Skull and upper cervical spine: Normal marrow signal is preserved. Sinuses/Orbits: Paranasal sinuses are  aerated. Orbits are unremarkable. Other: Sella is unremarkable.  Mastoid air cells are clear. IMPRESSION: Single focus of probable nonspecific gliosis/demyelination in the left frontal subcortical white matter of questionable significance. Otherwise normal study. Electronically Signed  By: Guadlupe Spanish M.D.   On: 07/17/2020 12:40   MR Cervical Spine W or Wo Contrast  Result Date: 07/17/2020 CLINICAL DATA:  Unsteady gait, numbness EXAM: MRI CERVICAL SPINE WITHOUT AND WITH CONTRAST TECHNIQUE: Multiplanar and multiecho pulse sequences of the cervical spine, to include the craniocervical junction and cervicothoracic junction, were obtained without and with intravenous contrast. CONTRAST:  53mL GADAVIST GADOBUTROL 1 MMOL/ML IV SOLN COMPARISON:  None. FINDINGS: Alignment: No significant listhesis. Vertebrae: Vertebral body heights are maintained. There is no marrow edema. No suspicious osseous lesion. Cord: Normal caliber and signal.  No abnormal enhancement. Posterior Fossa, vertebral arteries, paraspinal tissues: Unremarkable. Disc levels: C2-C3:  No canal or foraminal stenosis. C3-C4: Left greater than right uncovertebral hypertrophy. No canal stenosis. Minor right and moderate left foraminal stenosis. C4-C5: Right foraminal disc osteophyte complex. No canal or left foraminal stenosis. Moderate to marked right foraminal stenosis. C5-C6: Disc osteophyte complex slightly eccentric to the left indents the left ventral thecal sac. Minor canal stenosis. No foraminal stenosis. C6-C7: Disc osteophyte complex. Uncovertebral hypertrophy. No canal stenosis. Mild foraminal stenosis. C7-T1:  No canal or foraminal stenosis. IMPRESSION: No abnormal cord signal or enhancement. Degenerative changes as detailed above. No high-grade canal narrowing. Foraminal narrowing is greatest on the right at C4-C5. Electronically Signed   By: Guadlupe Spanish M.D.   On: 07/17/2020 12:46    Procedures Procedures   Medications Ordered in  ED Medications  sodium chloride 0.9 % bolus 1,000 mL (1,000 mLs Intravenous Bolus 07/17/20 1009)  gadobutrol (GADAVIST) 1 MMOL/ML injection 7 mL (8 mLs Intravenous Contrast Given 07/17/20 1142)    ED Course  I have reviewed the triage vital signs and the nursing notes.  Pertinent labs & imaging results that were available during my care of the patient were reviewed by me and considered in my medical decision making (see chart for details).    MDM Rules/Calculators/A&P                          Due to the multiple paresthesias, a MRI brain and c-spine done to eval for MS.  No evidence of MS.  I spoke with Dr. Selina Cooley (neurology) about the small demyelination area.  She is not concerned and that will not cause pt's sx.  Labs today are nl.  There are several send out labs that will not be back today.  She knows how to check my chart to see results.  Etiology of pt's sx are unclear.  They have been going on for months.  Due to lack of insurance, SW consulted to see if they can help facilitate pcp and neuro follow up.  Pt is stable for d/c.  Return if worse.  F/u with neuro.  Final Clinical Impression(s) / ED Diagnoses Final diagnoses:  Paresthesia  Imbalance    Rx / DC Orders ED Discharge Orders          Ordered    Ambulatory referral to Neurology       Comments: An appointment is requested in approximately: 1 week   07/17/20 1442             Jacalyn Lefevre, MD 07/17/20 1445

## 2020-07-17 NOTE — ED Triage Notes (Signed)
Pt arrived via POV with c/o feeling off balanced and numbness. Pt reports she went to [facility] on June 7th with the same complaints and was told to follow up with Neurology. Pt reports numbness started in her face and then over the last two weeks progressed to her arms and fingers.

## 2020-07-17 NOTE — ED Notes (Addendum)
Patient to MRI.

## 2020-07-17 NOTE — ED Notes (Signed)
Given report. Pt not in room. Pt in MRI per reporting RN

## 2020-07-18 LAB — RPR: RPR Ser Ql: NONREACTIVE

## 2020-07-18 LAB — HEMOGLOBIN A1C
Hgb A1c MFr Bld: 5.7 % — ABNORMAL HIGH (ref 4.8–5.6)
Mean Plasma Glucose: 117 mg/dL

## 2020-07-18 NOTE — Progress Notes (Signed)
..  Transition of Care [facility]) - Emergency Department Mini Assessment   Patient Details  Name: Delila Kuklinski MRN: [id number] Date of Birth: Sep 15, 1985  [facility]) CM/SW Contact:    Elliot Cousin, RN Phone Number: [phone] 07/18/2020, 12:49 PM   Clinical Narrative: TOC CM spoke to pt and states she contacted Neurologist that she had seen in the past to arrange appt for follow up. States she is waiting call back from [facility] Neurology. Gave permission to fax AVS to [facility].    ED Mini Assessment: What brought you to the Emergency Department? : numbness  Barriers to Discharge: No Barriers Identified  Barrier interventions: patient to schedule follow up appointment with Neurology  Means of departure: Car  Interventions which prevented an admission or readmission: Follow-up medical appointment    Patient Contact and Communications  Admission diagnosis:  Off balance, Not feeling in finger and arms There are no problems to display for this patient.  PCP:  Pcp, No Pharmacy:   [id number] [place], [place] - [phone] AT [facility] OF [facility] & [address] [place] [address] Phone: [phone] Fax: [phone]

## 2020-07-19 ENCOUNTER — Ambulatory Visit: Payer: Medicaid Other | Admitting: Physician Assistant

## 2020-07-19 ENCOUNTER — Telehealth (INDEPENDENT_AMBULATORY_CARE_PROVIDER_SITE_OTHER): Payer: Self-pay

## 2020-07-19 ENCOUNTER — Other Ambulatory Visit: Payer: Self-pay

## 2020-07-19 VITALS — BP 125/88 | HR 111 | Temp 98.2°F | Resp 18 | Ht 64.0 in | Wt 153.0 lb

## 2020-07-19 DIAGNOSIS — G609 Hereditary and idiopathic neuropathy, unspecified: Secondary | ICD-10-CM

## 2020-07-19 DIAGNOSIS — I10 Essential (primary) hypertension: Secondary | ICD-10-CM

## 2020-07-19 DIAGNOSIS — R930 Abnormal findings on diagnostic imaging of skull and head, not elsewhere classified: Secondary | ICD-10-CM | POA: Diagnosis not present

## 2020-07-19 DIAGNOSIS — E538 Deficiency of other specified B group vitamins: Secondary | ICD-10-CM

## 2020-07-19 DIAGNOSIS — Z6826 Body mass index (BMI) 26.0-26.9, adult: Secondary | ICD-10-CM

## 2020-07-19 LAB — PROTEIN ELECTROPHORESIS, SERUM
A/G Ratio: 1 (ref 0.7–1.7)
Albumin ELP: 3.6 g/dL (ref 2.9–4.4)
Alpha-1-Globulin: 0.2 g/dL (ref 0.0–0.4)
Alpha-2-Globulin: 0.6 g/dL (ref 0.4–1.0)
Beta Globulin: 1 g/dL (ref 0.7–1.3)
Gamma Globulin: 1.7 g/dL (ref 0.4–1.8)
Globulin, Total: 3.5 g/dL (ref 2.2–3.9)
Total Protein ELP: 7.1 g/dL (ref 6.0–8.5)

## 2020-07-19 NOTE — Telephone Encounter (Signed)
Copied from CRM [id number]. Topic: Appointment Scheduling - Scheduling Inquiry for Clinic >> Jul 19, 2020 12:13 PM Elliot Gault wrote: Reason for CRM: Neurologist advised patient to contact PCP and ask about receiving a B12 shot, patient would like to schedule a nurse visit today, please follow up

## 2020-07-19 NOTE — Progress Notes (Signed)
Established Patient Office Visit  Subjective:  Patient ID: Tanya Carroll, female    DOB: [birth year]  Age: 35 y.o. MRN: [id number]  CC:  Chief Complaint  Patient presents with   off balance     HPI Tanya Carroll reports that she was seen at the emergency department on July 17, 2020.  Hospital note    Pt presents to the ED today with feelings of numbness and feeling off balance.  Pt has had these sx for several months.  She did see neurology in March (Dr. Kandis [facility]) who recommended several blood tests and an EMG.  However, pt did not get them as she does not have insurance.  Pt presented to [facility] with similar sx on 6/7.  Neurology recommended outpatient f/u.  Pt has not followed up because she can't afford it.  Pt is here today because she has to hold onto the wall to keep her balance.  She feels paresthesias in both arms and legs.  She also has numbness around her mouth.   Due to the multiple paresthesias, a MRI brain and c-spine done to eval for MS.  No evidence of MS.  I spoke with Dr. Selina CooleyStack (neurology) about the small demyelination area.  She is not concerned and that will not cause pt's sx.   Labs today are nl.  There are several send out labs that will not be back today.  She knows how to check my chart to see results.  Etiology of pt's sx are unclear.  They have been going on for months.  Due to lack of insurance, SW consulted to see if they can help facilitate pcp and neuro follow up.   Pt is stable for d/c.  Return if worse.  F/u with neuro.  States that she did see Neurology earlier today : Patient Active Problem List  Diagnosis Date Noted   Idiopathic peripheral neuropathy 07/19/2020   Abnormal MRI of head 07/19/2020   Current Visit Problem list: 1. Idiopathic peripheral neuropathy  2. Abnormal MRI of head  3. Hypertension, unspecified type  4. B12 deficiency   Orders:  Orders placed in this encounter Active Orders (24h ago, onward)  Ordered  Vitamin  B12  07/19/20 1115      There are no discontinued medications. Follow-up:  Follow up in about 6 weeks (around 08/30/2020) for reviewing effectiveness of medications., reviewing progression of symptoms.. Patient Instructions and Education:  Patient Instructions  Please get the CD Of the MRI of the head [facility] Bring to next visit.    We discussed: Balance, strength and gait training. As needed we developed a plan of care that included education on balance, strength and gait training. Physical Therapy was considered.  Paulino RilyJ. Keith Miller, MD, FAAN Board Certified in Neurology [facility]   States during this visit that she was told to have a B12 injection and to start taking oral supplementation, he wants her to start taking 3 tablets once a day to help boost her numbers.  Reports that she tried to be seen by her primary care provider, however was unable to get an appointment.   Past Medical History:  Diagnosis Date   Hypertension    Placenta previa     History reviewed. No pertinent surgical history.  Family History  Problem Relation Age of Onset   Hypertension Mother    Hypertension Sister     Social History   Socioeconomic History  Marital status: Single    Spouse name: Not on file   Number of children: Not on file   Years of education: Not on file   Highest education level: Not on file  Occupational History   Not on file  Tobacco Use   Smoking status: Never   Smokeless tobacco: Never  Substance and Sexual Activity   Alcohol use: Yes    Alcohol/week: 2.0 standard drinks    Types: 2 Shots of liquor per week    Comment: Once or twice a week.   Drug use: No   Sexual activity: Yes    Birth control/protection: None  Other Topics Concern   Not on file  Social History Narrative   Not on file   Social Determinants of Health   Financial Resource Strain: Not on file  Food Insecurity: Not on file   Transportation Needs: Not on file  Physical Activity: Not on file  Stress: Not on file  Social Connections: Not on file  Intimate Partner Violence: Not on file    Outpatient Medications Prior to Visit  Medication Sig Dispense Refill   amLODipine (NORVASC) 10 MG tablet Take 1 tablet (10 mg total) by mouth daily. 90 tablet 3   losartan-hydrochlorothiazide (HYZAAR) 100-25 MG tablet Take 1 tablet by mouth daily. 90 tablet 3   potassium chloride (KLOR-CON) 10 MEQ tablet TAKE 1 TABLET(10 MEQ) BY MOUTH DAILY (Patient taking differently: Take 10 mEq by mouth daily.) 30 tablet 0   nitrofurantoin, macrocrystal-monohydrate, (MACROBID) 100 MG capsule Take 1 capsule (100 mg total) by mouth 2 (two) times daily. 14 capsule 0   phenazopyridine (PYRIDIUM) 100 MG tablet Take 1 tablet (100 mg total) by mouth 3 (three) times daily as needed for pain. 10 tablet 0   No facility-administered medications prior to visit.    Allergies  Allergen Reactions   Citrus     ROS Review of Systems  Constitutional:  Positive for fatigue. Negative for chills and fever.  HENT: Negative.    Eyes: Negative.   Respiratory:  Negative for shortness of breath.   Cardiovascular:  Negative for chest pain.  Gastrointestinal: Negative.   Endocrine: Negative.   Genitourinary: Negative.   Musculoskeletal:  Positive for gait problem.  Skin: Negative.   Allergic/Immunologic: Negative.   Neurological:  Positive for weakness and numbness.  Hematological: Negative.   Psychiatric/Behavioral: Negative.       Objective:    Physical Exam Vitals and nursing note reviewed.  Constitutional:      General: She is not in acute distress.    Appearance: Normal appearance.  HENT:     Head: Normocephalic and atraumatic.     Right Ear: External ear normal.     Left Ear: External ear normal.     Nose: Nose normal.     Mouth/Throat:     Mouth: Mucous membranes are moist.     Pharynx: Oropharynx is clear.  Eyes:     Extraocular  Movements: Extraocular movements intact.     Conjunctiva/sclera: Conjunctivae normal.     Pupils: Pupils are equal, round, and reactive to light.  Cardiovascular:     Rate and Rhythm: Normal rate and regular rhythm.     Pulses: Normal pulses.     Heart sounds: Normal heart sounds.  Pulmonary:     Effort: Pulmonary effort is normal.     Breath sounds: Normal breath sounds.  Musculoskeletal:        General: Normal range of motion.     Cervical  back: Normal range of motion and neck supple.     Comments: Unsteady gait; not using a walker or cane  Skin:    General: Skin is warm and dry.  Neurological:     General: No focal deficit present.     Mental Status: She is alert and oriented to person, place, and time.     Motor: Weakness present.     Gait: Gait abnormal.  Psychiatric:        Mood and Affect: Mood normal.        Behavior: Behavior normal.        Thought Content: Thought content normal.        Judgment: Judgment normal.    BP 125/88 (BP Location: Left Arm, Patient Position: Sitting, Cuff Size: Normal)   Pulse (!) 111   Temp 98.2 F (36.8 C) (Oral)   Resp 18   Ht 5\' 4"  (1.626 m)   Wt 153 lb (69.4 kg)   SpO2 97%   BMI 26.26 kg/m  Wt Readings from Last 3 Encounters:  07/19/20 153 lb (69.4 kg)  07/17/20 173 lb (78.5 kg)  07/04/20 173 lb (78.5 kg)     Health Maintenance Due  Topic Date Due   COVID-19 Vaccine (1) Never done   Hepatitis C Screening  Never done   PAP SMEAR-Modifier  Never done    There are no preventive care reminders to display for this patient.  Lab Results  Component Value Date   TSH 2.824 07/17/2020   Lab Results  Component Value Date   WBC 6.3 07/17/2020   HGB 14.5 07/17/2020   HCT 41.4 07/17/2020   MCV 123.2 (H) 07/17/2020   PLT 167 07/17/2020   Lab Results  Component Value Date   NA 138 07/17/2020   K 3.9 07/17/2020   CO2 28 07/17/2020   GLUCOSE 119 (H) 07/17/2020   BUN 8 07/17/2020   CREATININE 0.65 07/17/2020   BILITOT 1.8  (H) 07/17/2020   ALKPHOS 106 07/17/2020   AST 100 (H) 07/17/2020   ALT 52 (H) 07/17/2020   PROT 7.6 07/17/2020   ALBUMIN 3.6 07/17/2020   CALCIUM 9.1 07/17/2020   ANIONGAP 8 07/17/2020   Lab Results  Component Value Date   CHOL 191 07/01/2019   Lab Results  Component Value Date   HDL 119 07/01/2019   Lab Results  Component Value Date   LDLCALC 58 07/01/2019   Lab Results  Component Value Date   TRIG 79 07/01/2019   Lab Results  Component Value Date   CHOLHDL 1.6 07/01/2019   Lab Results  Component Value Date   HGBA1C 5.7 (H) 07/17/2020      Assessment & Plan:   Problem List Items Addressed This Visit       Cardiovascular and Mediastinum   Hypertension     Nervous and Auditory   Idiopathic peripheral neuropathy     Other   B12 deficiency - Primary   Abnormal MRI of head   Other Visit Diagnoses     BMI 26.0-26.9,adult           No orders of the defined types were placed in this encounter. 1. B12 deficiency Patient to return to mobile unit tomorrow for B12 injection.  Patient will continue to follow-up with neurology as directed.  Patient has upcoming appointment to establish care with a new primary care provider. 2. Idiopathic peripheral neuropathy  Red flags given for prompt reevaluation  3. Abnormal MRI of head   4.  Hypertension, unspecified type Continue current regimen  5. BMI 26.0-26.9,adult    I have reviewed the patient's medical history (PMH, PSH, Social History, Family History, Medications, and allergies) , and have been updated if relevant. I spent 31 minutes reviewing chart and  face to face time with patient.     Follow-up: Return in about 1 day (around 07/20/2020) for b12 injection.    Kasandra Knudsen Mayers, PA-C

## 2020-07-19 NOTE — Patient Instructions (Signed)
Please return to the mobile unit tomorrow for your B12 injection.  Please let us know if there is anything else we can do for you.  Roney Jaffe, PA-C Physician Assistant [facility] [email or web address]   Vitamin B12 Deficiency Vitamin B12 deficiency occurs when the body does not have enough vitamin B12, which is an important vitamin. The body needs this vitamin: To make red blood cells. To make DNA. This is the genetic material inside cells. To help the nerves work properly so they can carry messages from the brain to the body. Vitamin B12 deficiency can cause various health problems, such as a low red blood cell count (anemia) or nerve damage. What are the causes? This condition may be caused by: Not eating enough foods that contain vitamin B12. Not having enough stomach acid and digestive fluids to properly absorb vitamin B12 from the food that you eat. Certain digestive system diseases that make it hard to absorb vitamin B12. These diseases include Crohn's disease, chronic pancreatitis, and cystic fibrosis. A condition in which the body does not make enough of a protein (intrinsic factor), resulting in too few red blood cells (pernicious anemia). Having a surgery in which part of the stomach or small intestine is removed. Taking certain medicines that make it hard for the body to absorb vitamin B12. These medicines include: Heartburn medicines (antacids and proton pump inhibitors). Certain antibiotic medicines. Some medicines that are used to treat diabetes, tuberculosis, gout, or high cholesterol. What increases the risk? The following factors may make you more likely to develop a B12 deficiency: Being older than age 89. Eating a vegetarian or vegan diet, especially while you are pregnant. Eating a poor diet while you are pregnant. Taking certain medicines. Having alcoholism. What are the signs or symptoms? In some  cases, there are no symptoms of this condition. If the condition leads to anemia or nerve damage, various symptoms can occur, such as: Weakness. Fatigue. Loss of appetite. Weight loss. Numbness or tingling in your hands and feet. Redness and burning of the tongue. Confusion or memory problems. Depression. Sensory problems, such as color blindness, ringing in the ears, or loss of taste. Diarrhea or constipation. Trouble walking. If anemia is severe, symptoms can include: Shortness of breath. Dizziness. Rapid heart rate (tachycardia). How is this diagnosed? This condition may be diagnosed with a blood test to measure the level of vitamin B12 in your blood. You may also have other tests, including: A group of tests that measure certain characteristics of blood cells (complete blood count, CBC). A blood test to measure intrinsic factor. A procedure where a thin tube with a camera on the end is used to look into your stomach or intestines (endoscopy). Other tests may be needed to discover the cause of B12 deficiency. How is this treated? Treatment for this condition depends on the cause. This condition may be treated by: Changing your eating and drinking habits, such as: Eating more foods that contain vitamin B12. Drinking less alcohol or no alcohol. Getting vitamin B12 injections. Taking vitamin B12 supplements. Your health care provider will tell you which dosage is best for you. Follow these instructions at home: Eating and drinking  Eat lots of healthy foods that contain vitamin B12, including: Meats and poultry. This includes beef, pork, chicken, [place], and organ meats, such as liver. Seafood. This includes clams, rainbow trout, salmon, tuna, and haddock. Eggs. Cereal and dairy products that are fortified. This means that vitamin B12 has been added to  the food. Check the label on the package to see if the food is fortified. The items listed above may not be a complete list of  recommended foods and beverages. Contact a dietitian for more information. General instructions Get any injections that are prescribed by your health care provider. Take supplements only as told by your health care provider. Follow the directions carefully. Do not drink alcohol if your health care provider tells you not to. In some cases, you may only be asked to limit alcohol use. Keep all follow-up visits as told by your health care provider. This is important. Contact a health care provider if: Your symptoms come back. Get help right away if you: Develop shortness of breath. Have a rapid heart rate. Have chest pain. Become dizzy or lose consciousness. Summary Vitamin B12 deficiency occurs when the body does not have enough vitamin B12. The main causes of vitamin B12 deficiency include dietary deficiency, digestive diseases, pernicious anemia, and having a surgery in which part of the stomach or small intestine is removed. In some cases, there are no symptoms of this condition. If the condition leads to anemia or nerve damage, various symptoms can occur, such as weakness, shortness of breath, and numbness. Treatment may include getting vitamin B12 injections or taking vitamin B12 supplements. Eat lots of healthy foods that contain vitamin B12. This information is not intended to replace advice given to you by your health care provider. Make sure you discuss any questions you have with your healthcare provider. Document Revised: 07/03/2018 Document Reviewed: 09/23/2017 Elsevier Patient Education  2022 [employer].

## 2020-07-19 NOTE — Telephone Encounter (Signed)
Patient has made an additional call regarding the matter   Please contact to advise when possible

## 2020-07-19 NOTE — Progress Notes (Signed)
Patient presents for HFU. Patient has not eaten today. Patient has not taken medication today. Patient last took BP medications yesterday. Patient needs b12 injections per low levels resulted in the hospital.

## 2020-07-20 ENCOUNTER — Ambulatory Visit: Payer: Medicaid Other | Admitting: *Deleted

## 2020-07-20 DIAGNOSIS — R930 Abnormal findings on diagnostic imaging of skull and head, not elsewhere classified: Secondary | ICD-10-CM | POA: Insufficient documentation

## 2020-07-20 DIAGNOSIS — Z6826 Body mass index (BMI) 26.0-26.9, adult: Secondary | ICD-10-CM | POA: Insufficient documentation

## 2020-07-20 DIAGNOSIS — G609 Hereditary and idiopathic neuropathy, unspecified: Secondary | ICD-10-CM | POA: Insufficient documentation

## 2020-07-20 DIAGNOSIS — E538 Deficiency of other specified B group vitamins: Secondary | ICD-10-CM | POA: Insufficient documentation

## 2020-07-20 DIAGNOSIS — I10 Essential (primary) hypertension: Secondary | ICD-10-CM | POA: Insufficient documentation

## 2020-07-20 MED ORDER — CYANOCOBALAMIN 1000 MCG/ML IJ SOLN
1000.0000 ug | Freq: Once | INTRAMUSCULAR | Status: AC
  Administered 2020-07-20: 1000 ug via INTRAMUSCULAR

## 2020-07-20 NOTE — Telephone Encounter (Signed)
Seen at mobile clinic

## 2020-07-20 NOTE — Progress Notes (Signed)
Patient tolerated injection well today in the [facility].

## 2020-07-20 NOTE — Patient Instructions (Signed)
Thank you for returning to the [facility] to have your b12 injection administered. Please follow up with neurology for further treatment.  Take the OTC B12 that you purchased as directed.

## 2020-08-18 ENCOUNTER — Inpatient Hospital Stay (INDEPENDENT_AMBULATORY_CARE_PROVIDER_SITE_OTHER): Payer: Self-pay | Admitting: Primary Care

## 2020-11-02 ENCOUNTER — Telehealth: Payer: Self-pay

## 2020-11-03 NOTE — Telephone Encounter (Signed)
Duplicate

## 2021-07-09 ENCOUNTER — Ambulatory Visit: Payer: Medicaid Other | Admitting: Advanced Practice Midwife

## 2021-09-03 ENCOUNTER — Other Ambulatory Visit (HOSPITAL_COMMUNITY)
Admission: RE | Admit: 2021-09-03 | Discharge: 2021-09-03 | Disposition: A | Discharge status: Home / Self Care | Payer: Medicaid Other | Source: Ambulatory Visit | Attending: Advanced Practice Midwife | Admitting: Advanced Practice Midwife

## 2021-09-03 ENCOUNTER — Encounter: Payer: Self-pay | Admitting: Advanced Practice Midwife

## 2021-09-03 ENCOUNTER — Ambulatory Visit (INDEPENDENT_AMBULATORY_CARE_PROVIDER_SITE_OTHER): Payer: Medicaid Other | Admitting: Advanced Practice Midwife

## 2021-09-03 VITALS — BP 126/86 | HR 93 | Ht 64.0 in | Wt 156.0 lb

## 2021-09-03 DIAGNOSIS — Z113 Encounter for screening for infections with a predominantly sexual mode of transmission: Secondary | ICD-10-CM | POA: Diagnosis not present

## 2021-09-03 DIAGNOSIS — A5901 Trichomonal vulvovaginitis: Secondary | ICD-10-CM | POA: Diagnosis not present

## 2021-09-03 DIAGNOSIS — Z01419 Encounter for gynecological examination (general) (routine) without abnormal findings: Secondary | ICD-10-CM | POA: Diagnosis not present

## 2021-09-03 NOTE — Progress Notes (Unsigned)
New GYN patient presents for annual exam. Would like STD testing. Not interested in [facility].

## 2021-09-03 NOTE — Progress Notes (Signed)
   Subjective:     Tanya Carroll is a 36 y.o. female here at [facility] for a routine exam.  Current complaints: none.  Personal health questionnaire reviewed: yes.  Do you have a primary care provider? yes Do you feel safe at home? yes  Flowsheet Row Office Visit from 09/03/2021 in [facility]  PHQ-2 Total Score 0       Health Maintenance Due  Topic Date Due   Hepatitis C Screening  Never done   TETANUS/TDAP  Never done   PAP SMEAR-Modifier  Never done   INFLUENZA VACCINE  08/28/2021     Risk factors for chronic health problems: Smoking: Never Alchohol/how much: None Pt BMI: Body mass index is 26.78 kg/m.   Gynecologic History No LMP recorded. (Menstrual status: Irregular Periods). Contraception: condoms Last Pap: ?Marland Kitchen Results were: normal Last mammogram: n/a.  Obstetric History OB History  Gravida Para Term Preterm AB Living  1 1 1     1   SAB IAB Ectopic Multiple Live Births          1    # Outcome Date GA Lbr Len/2nd Weight Sex Delivery Anes PTL Lv  1 Term 11/04/07    M Vag-Spont EPI  LIV     Complications: Placenta Previa     The following portions of the patient's history were reviewed and updated as appropriate: allergies, current medications, past family history, past medical history, past social history, past surgical history, and problem list.  Review of Systems Pertinent items noted in HPI and remainder of comprehensive ROS otherwise negative.    Objective:   BP 126/86   Pulse 93   Ht 5\' 4"  (1.626 m)   Wt 156 lb (70.8 kg)   BMI 26.78 kg/m  VS reviewed, nursing note reviewed,  Constitutional: well developed, well nourished, no distress HEENT: normocephalic CV: normal rate Pulm/chest wall: normal effort Breast Exam:  exam performed: right breast normal without mass, skin or nipple changes or axillary nodes, left breast normal without mass, skin or nipple changes or axillary nodes Abdomen: soft Neuro: alert and  oriented x 3 Skin: warm, dry Psych: affect normal Pelvic exam:Performed: Cervix pink, visually closed, without lesion, scant white creamy discharge, vaginal walls and external genitalia normal Bimanual exam: Cervix 0/long/high, firm, anterior, neg CMT, uterus nontender, nonenlarged, adnexa without tenderness, enlargement, or mass       Assessment/Plan:   1. Routine screening for STI (sexually transmitted infection)  - Cervicovaginal ancillary only - Hepatitis B surface antigen - Hepatitis C antibody - RPR - HIV Antibody (routine testing w rflx) - Cytology - PAP( [place])     2. Well woman exam with routine gynecological exam    No follow-ups on file.   M, CNM 3:20 PM

## 2021-09-04 LAB — CERVICOVAGINAL ANCILLARY ONLY
Chlamydia: NEGATIVE
Comment: NEGATIVE
Comment: NEGATIVE
Comment: NORMAL
Neisseria Gonorrhea: NEGATIVE
Trichomonas: POSITIVE — AB

## 2021-09-05 LAB — HIV ANTIBODY (ROUTINE TESTING W REFLEX): HIV Screen 4th Generation wRfx: NONREACTIVE

## 2021-09-05 LAB — RPR: RPR Ser Ql: NONREACTIVE

## 2021-09-05 LAB — HEPATITIS B SURFACE ANTIGEN

## 2021-09-05 LAB — HEPATITIS C ANTIBODY

## 2021-09-06 LAB — CYTOLOGY - PAP
Comment: NEGATIVE
Diagnosis: NEGATIVE
High risk HPV: NEGATIVE

## 2021-09-07 MED ORDER — METRONIDAZOLE 500 MG PO TABS: 500.0000 mg | ORAL_TABLET | Freq: Two times a day (BID) | ORAL | 0 refills | Status: DC

## 2021-09-07 NOTE — Addendum Note (Signed)
Addended by: Sharen Counter A on: 09/07/2021 03:26 AM   Modules accepted: Orders

## 2021-09-25 ENCOUNTER — Telehealth: Payer: Self-pay

## 2021-09-25 NOTE — Telephone Encounter (Signed)
Called pt to follow up after receiving message from after hours line about med refill. No answer, left vm

## 2021-10-24 ENCOUNTER — Telehealth: Payer: Self-pay

## 2021-10-24 DIAGNOSIS — A5901 Trichomonal vulvovaginitis: Secondary | ICD-10-CM

## 2021-10-24 MED ORDER — METRONIDAZOLE 500 MG PO TABS: 500.0000 mg | ORAL_TABLET | Freq: Two times a day (BID) | ORAL | 0 refills | Status: AC

## 2021-10-24 NOTE — Telephone Encounter (Signed)
Returned call and pt stated that she never picked up rx for +Trich, pharmacist advised her that she needs new rx sent. Sent rx and advised of partner treatment

## 2021-10-26 ENCOUNTER — Encounter (HOSPITAL_BASED_OUTPATIENT_CLINIC_OR_DEPARTMENT_OTHER): Payer: Self-pay | Admitting: Emergency Medicine

## 2021-10-26 ENCOUNTER — Emergency Department (HOSPITAL_BASED_OUTPATIENT_CLINIC_OR_DEPARTMENT_OTHER): Payer: Medicaid Other

## 2021-10-26 ENCOUNTER — Emergency Department (HOSPITAL_BASED_OUTPATIENT_CLINIC_OR_DEPARTMENT_OTHER)
Admission: EM | Admit: 2021-10-26 | Discharge: 2021-10-26 | Disposition: A | Discharge status: Home / Self Care | Payer: Medicaid Other | Attending: Emergency Medicine | Admitting: Emergency Medicine

## 2021-10-26 ENCOUNTER — Other Ambulatory Visit: Payer: Self-pay

## 2021-10-26 DIAGNOSIS — R300 Dysuria: Secondary | ICD-10-CM | POA: Diagnosis not present

## 2021-10-26 DIAGNOSIS — N12 Tubulo-interstitial nephritis, not specified as acute or chronic: Secondary | ICD-10-CM

## 2021-10-26 DIAGNOSIS — R1030 Lower abdominal pain, unspecified: Secondary | ICD-10-CM | POA: Insufficient documentation

## 2021-10-26 DIAGNOSIS — R112 Nausea with vomiting, unspecified: Secondary | ICD-10-CM | POA: Diagnosis not present

## 2021-10-26 DIAGNOSIS — R319 Hematuria, unspecified: Secondary | ICD-10-CM | POA: Diagnosis not present

## 2021-10-26 LAB — CBC
HCT: 45.3 % (ref 36.0–46.0)
Hemoglobin: 15.8 g/dL — ABNORMAL HIGH (ref 12.0–15.0)
MCH: 37.4 pg — ABNORMAL HIGH (ref 26.0–34.0)
MCHC: 34.9 g/dL (ref 30.0–36.0)
MCV: 107.3 fL — ABNORMAL HIGH (ref 80.0–100.0)
Platelets: 199 10*3/uL (ref 150–400)
RBC: 4.22 MIL/uL (ref 3.87–5.11)
RDW: 14.1 % (ref 11.5–15.5)
WBC: 6.9 10*3/uL (ref 4.0–10.5)
nRBC: 0.3 % — ABNORMAL HIGH (ref 0.0–0.2)

## 2021-10-26 LAB — COMPREHENSIVE METABOLIC PANEL
ALT: 43 U/L (ref 0–44)
AST: 72 U/L — ABNORMAL HIGH (ref 15–41)
Albumin: 3.3 g/dL — ABNORMAL LOW (ref 3.5–5.0)
Alkaline Phosphatase: 104 U/L (ref 38–126)
Anion gap: 10 (ref 5–15)
BUN: 10 mg/dL (ref 6–20)
CO2: 29 mmol/L (ref 22–32)
Calcium: 8.8 mg/dL — ABNORMAL LOW (ref 8.9–10.3)
Chloride: 100 mmol/L (ref 98–111)
Creatinine, Ser: 0.57 mg/dL (ref 0.44–1.00)
GFR, Estimated: >60 mL/min (ref >60)
Glucose, Bld: 100 mg/dL — ABNORMAL HIGH (ref 70–99)
Potassium: 3.4 mmol/L — ABNORMAL LOW (ref 3.5–5.1)
Sodium: 139 mmol/L (ref 135–145)
Total Bilirubin: 2.4 mg/dL — ABNORMAL HIGH (ref 0.3–1.2)
Total Protein: 8.1 g/dL (ref 6.5–8.1)

## 2021-10-26 LAB — URINALYSIS, ROUTINE W REFLEX MICROSCOPIC
Glucose, UA: NEGATIVE mg/dL
Hgb urine dipstick: NEGATIVE
Ketones, ur: 40 mg/dL — AB
Nitrite: POSITIVE — AB
Protein, ur: 30 mg/dL — AB
Specific Gravity, Urine: 1.015 (ref 1.005–1.030)
pH: 7 (ref 5.0–8.0)

## 2021-10-26 LAB — CK: Total CK: 170 U/L (ref 38–234)

## 2021-10-26 LAB — LIPASE, BLOOD: Lipase: 57 U/L — ABNORMAL HIGH (ref 11–51)

## 2021-10-26 LAB — URINALYSIS, MICROSCOPIC (REFLEX): RBC / HPF: NONE SEEN RBC/hpf (ref 0–5)

## 2021-10-26 LAB — PREGNANCY, URINE: Preg Test, Ur: NEGATIVE

## 2021-10-26 MED ORDER — ONDANSETRON HCL 4 MG/2ML IJ SOLN
4.0000 mg | Freq: Once | INTRAMUSCULAR | Status: AC | PRN
  Administered 2021-10-26: 4 mg via INTRAVENOUS
  Filled 2021-10-26: qty 2

## 2021-10-26 MED ORDER — MORPHINE SULFATE (PF) 4 MG/ML IV SOLN
4.0000 mg | Freq: Once | INTRAVENOUS | Status: AC
  Administered 2021-10-26: 4 mg via INTRAVENOUS
  Filled 2021-10-26: qty 1

## 2021-10-26 MED ORDER — CEPHALEXIN 500 MG PO CAPS: 500.0000 mg | ORAL_CAPSULE | Freq: Three times a day (TID) | ORAL | 0 refills | Status: DC

## 2021-10-26 MED ORDER — IOHEXOL 300 MG/ML  SOLN
100.0000 mL | Freq: Once | INTRAMUSCULAR | Status: AC | PRN
  Administered 2021-10-26: 100 mL via INTRAVENOUS

## 2021-10-26 MED ORDER — SODIUM CHLORIDE 0.9 % IV BOLUS
1000.0000 mL | Freq: Once | INTRAVENOUS | Status: AC
  Administered 2021-10-26: 1000 mL via INTRAVENOUS

## 2021-10-26 MED ORDER — ONDANSETRON HCL 4 MG PO TABS: 4.0000 mg | ORAL_TABLET | Freq: Four times a day (QID) | ORAL | 0 refills | Status: DC

## 2021-10-26 MED ORDER — SODIUM CHLORIDE 0.9 % IV SOLN
1.0000 g | Freq: Once | INTRAVENOUS | Status: AC
  Administered 2021-10-26: 1 g via INTRAVENOUS
  Filled 2021-10-26: qty 10

## 2021-10-26 NOTE — ED Provider Notes (Signed)
[facility] EMERGENCY DEPARTMENT Provider Note   CSN: [id number] Arrival date & time: 10/26/21  1832     History  Chief Complaint  Patient presents with   Hematuria   Abdominal Pain    Tanya Carroll is a 36 y.o. female here presenting with hematuria and dysuria.  Patient states that she has been having nausea vomiting since yesterday.  Her urine appears darker than usual.  She went to [place] and had a UA showed UTI.  She was sent in for further evaluation.  Denies any fevers at home.  Denies any sick contacts.   The history is provided by the patient.       Home Medications Prior to Admission medications   Medication Sig Start Date End Date Taking? Authorizing Provider  amLODipine (NORVASC) 10 MG tablet Take 1 tablet (10 mg total) by mouth daily. 07/01/19   Kerin Perna, NP  furosemide (LASIX) 20 MG tablet Take 20 mg by mouth daily. 09/02/21   [provider]  losartan-hydrochlorothiazide (HYZAAR) 100-25 MG tablet Take 1 tablet by mouth daily. 07/01/19   Kerin Perna, NP  metroNIDAZOLE (FLAGYL) 500 MG tablet Take 1 tablet (500 mg total) by mouth 2 (two) times daily for 7 days. 10/24/21 10/31/21  Leftwich-Kirby, Kathie Dike, CNM  Oxycodone HCl 10 MG TABS Take 10 mg by mouth 4 (four) times daily. 08/08/21   [provider]  potassium chloride (KLOR-CON) 10 MEQ tablet TAKE 1 TABLET(10 MEQ) BY MOUTH DAILY Patient taking differently: Take 10 mEq by mouth daily. 08/28/19   Kerin Perna, NP  Vitamin D, Ergocalciferol, (DRISDOL) 1.25 MG (50000 UNIT) CAPS capsule Take 50,000 Units by mouth once a week. 05/14/21   [provider]      Allergies    Citrus    Review of Systems   Review of Systems  Constitutional:  Positive for chills and fever.  Genitourinary:  Positive for hematuria.  All other systems reviewed and are negative.   Physical Exam Updated Vital Signs BP (!) 141/103   Pulse 89   Temp 98.3 F (36.8 C) (Oral)    Resp 16   Ht 5\' 4"  (1.626 m)   Wt 74.4 kg   SpO2 100%   BMI 28.15 kg/m  Physical Exam Vitals and nursing note reviewed.  Constitutional:      Appearance: She is well-developed.  HENT:     Head: Normocephalic.     Mouth/Throat:     Mouth: Mucous membranes are moist.  Eyes:     Extraocular Movements: Extraocular movements intact.     Pupils: Pupils are equal, round, and reactive to light.  Cardiovascular:     Rate and Rhythm: Normal rate and regular rhythm.  Abdominal:     General: Abdomen is flat.     Comments: Mild suprapubic and right CVA tenderness  Skin:    Capillary Refill: Capillary refill takes less than 2 seconds.  Neurological:     General: No focal deficit present.     Mental Status: She is alert and oriented to person, place, and time.  Psychiatric:        Mood and Affect: Mood normal.        Behavior: Behavior normal.     ED Results / Procedures / Treatments   Labs (all labs ordered are listed, but only abnormal results are displayed) Labs Reviewed  LIPASE, BLOOD - Abnormal; Notable for the following components:      Result Value   Lipase  57 (*)    All other components within normal limits  COMPREHENSIVE METABOLIC PANEL - Abnormal; Notable for the following components:   Potassium 3.4 (*)    Glucose, Bld 100 (*)    Calcium 8.8 (*)    Albumin 3.3 (*)    AST 72 (*)    Total Bilirubin 2.4 (*)    All other components within normal limits  CBC - Abnormal; Notable for the following components:   Hemoglobin 15.8 (*)    MCV 107.3 (*)    MCH 37.4 (*)    nRBC 0.3 (*)    All other components within normal limits  URINALYSIS, ROUTINE W REFLEX MICROSCOPIC - Abnormal; Notable for the following components:   Color, Urine BROWN (*)    APPearance CLOUDY (*)    Bilirubin Urine MODERATE (*)    Ketones, ur 40 (*)    Protein, ur 30 (*)    Nitrite POSITIVE (*)    Leukocytes,Ua TRACE (*)    All other components within normal limits  URINALYSIS, MICROSCOPIC  (REFLEX) - Abnormal; Notable for the following components:   Bacteria, UA MANY (*)    All other components within normal limits  PREGNANCY, URINE  CK    EKG None  Radiology CT ABDOMEN PELVIS W CONTRAST  Result Date: 10/26/2021 CLINICAL DATA:  Urinary tract infection, hematuria, abdominal pain for 2 days EXAM: CT ABDOMEN AND PELVIS WITH CONTRAST TECHNIQUE: Multidetector CT imaging of the abdomen and pelvis was performed using the standard protocol following bolus administration of intravenous contrast. RADIATION DOSE REDUCTION: This exam was performed according to the departmental dose-optimization program which includes automated exposure control, adjustment of the mA and/or kV according to patient size and/or use of iterative reconstruction technique. CONTRAST:  OMNIPAQUE IOHEXOL 300 MG/ML  SOLN COMPARISON:  None Available. FINDINGS: Lower chest: No acute pleural or parenchymal lung disease. Hepatobiliary: Diffuse hepatic steatosis. No focal liver abnormality. No evidence of cholelithiasis or cholecystitis. No biliary duct dilation. Pancreas: Unremarkable. No pancreatic ductal dilatation or surrounding inflammatory changes. Spleen: Normal in size without focal abnormality. Adrenals/Urinary Tract: The kidneys enhance normally and symmetrically. No urinary tract calculi or obstructive uropathy. The adrenals are unremarkable. Bladder is decompressed, limiting its evaluation. Stomach/Bowel: No bowel obstruction or ileus. Normal appendix right lower quadrant. No bowel wall thickening or inflammatory change. Vascular/Lymphatic: No significant vascular findings are present. No enlarged abdominal or pelvic lymph nodes. Reproductive: 3.2 cm simple cyst or dominant follicle within the right ovary. No specific imaging follow-up is required. Left adnexa and uterus are unremarkable. Other: There is no free intraperitoneal fluid. There is mild nonspecific fat stranding in the right retroperitoneal space just  inferior to the right kidney. No free intraperitoneal gas. Small fat containing umbilical hernia. No bowel herniation. Musculoskeletal: No acute or destructive bony lesions. Reconstructed images demonstrate no additional findings. IMPRESSION: 1. Nonspecific fat stranding within the right retroperitoneum inferior to the right kidney and posterior to the ascending colon. No associated abnormalities of the right kidney or ascending colon to suggest inflammation or infection. 2. Otherwise no acute intra-abdominal or intrapelvic process. 3. Hepatic steatosis. Electronically Signed   By: Sharlet Salina M.D.   On: 10/26/2021 20:03    Procedures Procedures    Medications Ordered in ED Medications  cefTRIAXone (ROCEPHIN) 1 g in sodium chloride 0.9 % 100 mL IVPB (has no administration in time range)  ondansetron (ZOFRAN) injection 4 mg (4 mg Intravenous Given 10/26/21 1941)  sodium chloride 0.9 % bolus 1,000 mL (1,000  mLs Intravenous New Bag/Given 10/26/21 1941)  morphine (PF) 4 MG/ML injection 4 mg (4 mg Intravenous Given 10/26/21 1943)  iohexol (OMNIPAQUE) 300 MG/ML solution 100 mL (100 mLs Intravenous Contrast Given 10/26/21 1947)    ED Course/ Medical Decision Making/ A&P                           Medical Decision Making Laticia Smieja is a 36 y.o. female here presenting with right flank pain and dysuria.  Concern for pyelonephritis versus infected kidney stone versus complicated UTI.  Plan to get CBC and CMP and UA and CT abdomen pelvis  8:49 PM UA showed nitrates and bacteria.  CT abdomen pelvis showed fat stranding around the right retroperitoneum inferior to the kidney consistent with possible pyelonephritis.  There is no renal abscess.  Patient was given Rocephin.  Patient will be discharged home with a week of Keflex.   Problems Addressed: Pyelonephritis: acute illness or injury  Amount and/or Complexity of Data Reviewed Labs: ordered. Decision-making details documented in ED  Course. Radiology: ordered and independent interpretation performed. Decision-making details documented in ED Course.  Risk Prescription drug management.    Final Clinical Impression(s) / ED Diagnoses Final diagnoses:  None    Rx / DC Orders ED Discharge Orders     None         Drenda Freeze, MD 10/26/21 2050

## 2021-10-26 NOTE — ED Notes (Signed)
D/c paperwork reviewed with pt, including prescriptions. All questions addressed prior to d/c. Pt ambulatory to ED exit to meet sister for ride.

## 2021-10-26 NOTE — Discharge Instructions (Signed)
You have a kidney infection.  Take Keflex 3 times daily for a week for kidney infection  Take Zofran for nausea and stay hydrated  See your doctor for follow-up  Return to ER if you have worse flank pain, abdominal pain, fever, vomiting

## 2021-10-26 NOTE — ED Notes (Signed)
Pt aware urine sample is needed 

## 2021-10-26 NOTE — ED Triage Notes (Signed)
Pt POV c/o abdominal pain x2 days, hematuria today. Reports urine as dark red. C/o diarrhea x1 day.  C/o nausea x1 day, emesis yesterday.  Denies fever, denies known sick contacts.

## 2021-11-09 ENCOUNTER — Emergency Department (HOSPITAL_BASED_OUTPATIENT_CLINIC_OR_DEPARTMENT_OTHER): Payer: Medicaid Other

## 2021-11-09 ENCOUNTER — Other Ambulatory Visit: Payer: Self-pay

## 2021-11-09 ENCOUNTER — Encounter (HOSPITAL_BASED_OUTPATIENT_CLINIC_OR_DEPARTMENT_OTHER): Payer: Self-pay | Admitting: Emergency Medicine

## 2021-11-09 ENCOUNTER — Emergency Department (HOSPITAL_BASED_OUTPATIENT_CLINIC_OR_DEPARTMENT_OTHER)
Admission: EM | Admit: 2021-11-09 | Discharge: 2021-11-09 | Disposition: A | Discharge status: Home / Self Care | Payer: Medicaid Other | Attending: Emergency Medicine | Admitting: Emergency Medicine

## 2021-11-09 DIAGNOSIS — S99921A Unspecified injury of right foot, initial encounter: Secondary | ICD-10-CM | POA: Diagnosis present

## 2021-11-09 DIAGNOSIS — W010XXA Fall on same level from slipping, tripping and stumbling without subsequent striking against object, initial encounter: Secondary | ICD-10-CM | POA: Diagnosis not present

## 2021-11-09 DIAGNOSIS — S92344A Nondisplaced fracture of fourth metatarsal bone, right foot, initial encounter for closed fracture: Secondary | ICD-10-CM | POA: Insufficient documentation

## 2021-11-09 MED ORDER — HYDROCODONE-ACETAMINOPHEN 5-325 MG PO TABS
1.0000 | ORAL_TABLET | Freq: Once | ORAL | Status: AC
  Administered 2021-11-09: 1 via ORAL
  Filled 2021-11-09: qty 1

## 2021-11-09 NOTE — ED Triage Notes (Signed)
Patient arrived via POV c/o right foot injury x 7 days. Patient states falling while trying to put on PJ's . Patient does not know what she struck her foot on. Patient states increased swelling with pain while weight bearing. Patient states 10/10 pain. Patient is AO x 4, VS w/ elevated HR, slow gait.

## 2021-11-09 NOTE — ED Provider Notes (Signed)
[facility] EMERGENCY DEPARTMENT Provider Note   CSN: [id number] Arrival date & time: 11/09/21  1941     History  Chief Complaint  Patient presents with   Foot Injury    Jodelle Fausto is a 36 y.o. female, no pertinent past medical history, who presents to the ED secondary to a fall that occurred about 7 days ago, where she landed on her right foot, and has had pain since then.  She states that she was trying to put on her PJs and tripped over a rug, and fell someway.  Does not know how she fell on her foot, but states its been very swollen and painful since then.  She has been using a rollator, to help walk, but it has still been very swollen has attempted to wrap it, the swelling has been persistent.  She denies any numbness to the area, or coolness to the area.  Has tried ice without relief.  Has been walking on it for the last 2 days especially without the walker.  No calf pain or swelling.  Was trying to call the orthopedics doctor, but states that there was some issues that happened, and was able to get a hold of them.   Foot Injury      Home Medications Prior to Admission medications   Medication Sig Start Date End Date Taking? Authorizing Provider  amLODipine (NORVASC) 10 MG tablet Take 1 tablet (10 mg total) by mouth daily. 07/01/19   Kerin Perna, NP  cephALEXin (KEFLEX) 500 MG capsule Take 1 capsule (500 mg total) by mouth 3 (three) times daily. 10/26/21   Drenda Freeze, MD  furosemide (LASIX) 20 MG tablet Take 20 mg by mouth daily. 09/02/21   [provider]  losartan-hydrochlorothiazide (HYZAAR) 100-25 MG tablet Take 1 tablet by mouth daily. 07/01/19   Kerin Perna, NP  ondansetron (ZOFRAN) 4 MG tablet Take 1 tablet (4 mg total) by mouth every 6 (six) hours. 10/26/21   Drenda Freeze, MD  Oxycodone HCl 10 MG TABS Take 10 mg by mouth 4 (four) times daily. 08/08/21   [provider]  potassium chloride (KLOR-CON) 10 MEQ tablet  TAKE 1 TABLET(10 MEQ) BY MOUTH DAILY Patient taking differently: Take 10 mEq by mouth daily. 08/28/19   Kerin Perna, NP  Vitamin D, Ergocalciferol, (DRISDOL) 1.25 MG (50000 UNIT) CAPS capsule Take 50,000 Units by mouth once a week. 05/14/21   [provider]      Allergies    Citrus    Review of Systems   Review of Systems  Musculoskeletal:  Positive for joint swelling.  Skin:  Negative for color change, pallor and rash.    Physical Exam Updated Vital Signs BP 123/86 (BP Location: Right Arm)   Pulse (!) 110   Temp 98.1 F (36.7 C) (Oral)   Resp 20   Ht 5\' 4"  (1.626 m)   Wt 74.8 kg   SpO2 95%   BMI 28.32 kg/m  Physical Exam Vitals and nursing note reviewed.  Constitutional:      General: She is not in acute distress.    Appearance: She is well-developed.  HENT:     Head: Normocephalic and atraumatic.  Eyes:     General:        Right eye: No discharge.        Left eye: No discharge.     Conjunctiva/sclera: Conjunctivae normal.  Cardiovascular:     Pulses: Normal pulses.  Pulmonary:  Effort: No respiratory distress.  Musculoskeletal:     Comments: Right foot/ankle: TTP of 1st and 4th metatarsal. Edema noted to foot diffusely and ankle.. Able to bear weight. Able to plantar flex and dorsiflex ankle. Inversion/eversion intact.  No base of 5th metatarsal tenderness to palpation. Capillary refill <2sec. Dorsalis pedis pulse present. No foot drop noted. Sensation intact. Warm to touch.    Skin:    Capillary Refill: Capillary refill takes less than 2 seconds.  Neurological:     Mental Status: She is alert and oriented to person, place, and time.     Sensory: No sensory deficit.     Motor: No weakness.     Comments: Clear speech.   Psychiatric:        Behavior: Behavior normal.        Thought Content: Thought content normal.     ED Results / Procedures / Treatments   Labs (all labs ordered are listed, but only abnormal results are displayed) Labs  Reviewed - No data to display  EKG None  Radiology DG Foot Complete Right  Result Date: 11/09/2021 CLINICAL DATA:  Fall 1 week ago putting on pajamas, right foot and ankle pain. EXAM: RIGHT FOOT COMPLETE - 3+ VIEW COMPARISON:  None Available. FINDINGS: Acute nondisplaced fracture of the proximal fourth metatarsal shaft. No intra-articular extension. No additional fracture. Slight hammertoe deformity of the digits. No erosive change. There is generalized soft tissue edema, most prominent over the dorsum of the foot. IMPRESSION: Acute nondisplaced fracture of the proximal fourth metatarsal shaft. Electronically Signed   By: Narda Rutherford M.D.   On: 11/09/2021 20:42   DG Ankle Complete Right  Result Date: 11/09/2021 CLINICAL DATA:  Fall 1 week ago while putting on pajamas. Right foot and ankle pain. EXAM: RIGHT ANKLE - COMPLETE 3+ VIEW COMPARISON:  None Available. FINDINGS: There is no evidence of fracture, dislocation, or joint effusion. The ankle mortise is preserved. Intact talar dome. Generalized subcutaneous edema. IMPRESSION: Generalized subcutaneous edema. No fracture or subluxation. Electronically Signed   By: Narda Rutherford M.D.   On: 11/09/2021 20:42    Procedures Procedures    Medications Ordered in ED Medications  HYDROcodone-acetaminophen (NORCO/VICODIN) 5-325 MG per tablet 1 tablet (has no administration in time range)    ED Course/ Medical Decision Making/ A&P                           Medical Decision Making Patient is here after falling about a week ago and landing on her foot, no head trauma.  Complains of right foot pain since then, and swelling.  Has been trying to use a walker, but has also been ambulating on it for the last 2 days.  Is been very swollen, x-rays were obtained for further details.  She did have a pulse, and no neurodeficits were noted.  She has no calf tenderness, or redness on the foot.  Amount and/or Complexity of Data Reviewed Radiology:  ordered.    Details: X-ray shows fourth metatarsal fracture that is nondisplaced of the right foot.  No other findings other than mild dorsal and edema. Discussion of management or test interpretation with external provider(s): Discussed with Dr. Jena Gauss orthopedics on-call, he recommends follow-up in 1 week, okay with hard toe shoe, discharged home.  He states able to weight-bear.  Discussed and offered crutches, patient declined, she states she will ambulate with her walker as she has been.  Emphasized elevation of foot,  15 minutes ice on, 15 minutes off.  Return precautions emphasized.  Patient voiced understanding.  Prescribed pain medications as patient is on chronic oxycodone  Risk Prescription drug management.   Final Clinical Impression(s) / ED Diagnoses Final diagnoses:  Closed nondisplaced fracture of fourth metatarsal bone of right foot, initial encounter    Rx / DC Orders ED Discharge Orders     None         Yoav Okane, Harley [place], PA 11/09/21 2155    Pricilla Loveless, MD 11/10/21 [phone]

## 2021-11-09 NOTE — Discharge Instructions (Signed)
Please follow-up with orthopedics as prescribed, continue icing and elevating your foot at night.  If you start seeing changes in color, worsening pain, or your foot feels cold and you cannot find a pulse, please return to the ER.

## 2022-09-09 ENCOUNTER — Encounter (HOSPITAL_COMMUNITY): Payer: Self-pay

## 2022-09-09 ENCOUNTER — Other Ambulatory Visit: Payer: Self-pay

## 2022-09-09 ENCOUNTER — Emergency Department (HOSPITAL_COMMUNITY): Payer: Medicaid Other

## 2022-09-09 ENCOUNTER — Emergency Department (HOSPITAL_COMMUNITY)
Admission: EM | Admit: 2022-09-09 | Discharge: 2022-09-09 | Disposition: A | Discharge status: Home / Self Care | Payer: Medicaid Other | Attending: Emergency Medicine | Admitting: Emergency Medicine

## 2022-09-09 DIAGNOSIS — K5900 Constipation, unspecified: Secondary | ICD-10-CM | POA: Diagnosis not present

## 2022-09-09 DIAGNOSIS — R1031 Right lower quadrant pain: Secondary | ICD-10-CM | POA: Diagnosis present

## 2022-09-09 DIAGNOSIS — N83201 Unspecified ovarian cyst, right side: Secondary | ICD-10-CM

## 2022-09-09 DIAGNOSIS — R109 Unspecified abdominal pain: Secondary | ICD-10-CM

## 2022-09-09 LAB — COMPREHENSIVE METABOLIC PANEL
ALT: 25 U/L (ref 0–44)
AST: 44 U/L — ABNORMAL HIGH (ref 15–41)
Albumin: 3.8 g/dL (ref 3.5–5.0)
Alkaline Phosphatase: 93 U/L (ref 38–126)
Anion gap: 16 — ABNORMAL HIGH (ref 5–15)
BUN: 6 mg/dL (ref 6–20)
CO2: 21 mmol/L — ABNORMAL LOW (ref 22–32)
Calcium: 8.9 mg/dL (ref 8.9–10.3)
Chloride: 98 mmol/L (ref 98–111)
Creatinine, Ser: 0.61 mg/dL (ref 0.44–1.00)
GFR, Estimated: >60 mL/min (ref >60)
Glucose, Bld: 85 mg/dL (ref 70–99)
Potassium: 4.1 mmol/L (ref 3.5–5.1)
Sodium: 135 mmol/L (ref 135–145)
Total Bilirubin: 1.5 mg/dL — ABNORMAL HIGH (ref 0.3–1.2)
Total Protein: 8.5 g/dL — ABNORMAL HIGH (ref 6.5–8.1)

## 2022-09-09 LAB — CBC
HCT: 47.4 % — ABNORMAL HIGH (ref 36.0–46.0)
Hemoglobin: 15.8 g/dL — ABNORMAL HIGH (ref 12.0–15.0)
MCH: 36.1 pg — ABNORMAL HIGH (ref 26.0–34.0)
MCHC: 33.3 g/dL (ref 30.0–36.0)
MCV: 108.2 fL — ABNORMAL HIGH (ref 80.0–100.0)
Platelets: 243 10*3/uL (ref 150–400)
RBC: 4.38 MIL/uL (ref 3.87–5.11)
RDW: 12.7 % (ref 11.5–15.5)
WBC: 7 10*3/uL (ref 4.0–10.5)
nRBC: 0.3 % — ABNORMAL HIGH (ref 0.0–0.2)

## 2022-09-09 LAB — URINALYSIS, ROUTINE W REFLEX MICROSCOPIC
Bilirubin Urine: NEGATIVE
Glucose, UA: NEGATIVE mg/dL
Hgb urine dipstick: NEGATIVE
Ketones, ur: 5 mg/dL — AB
Nitrite: NEGATIVE
Protein, ur: NEGATIVE mg/dL
Specific Gravity, Urine: 1.011 (ref 1.005–1.030)
pH: 5 (ref 5.0–8.0)

## 2022-09-09 LAB — HCG, SERUM, QUALITATIVE: Preg, Serum: NEGATIVE

## 2022-09-09 LAB — LIPASE, BLOOD: Lipase: 31 U/L (ref 11–51)

## 2022-09-09 MED ORDER — IOHEXOL 350 MG/ML SOLN
75.0000 mL | Freq: Once | INTRAVENOUS | Status: AC | PRN
  Administered 2022-09-09: 75 mL via INTRAVENOUS

## 2022-09-09 MED ORDER — KETOROLAC TROMETHAMINE 15 MG/ML IJ SOLN
15.0000 mg | Freq: Once | INTRAMUSCULAR | Status: AC
  Administered 2022-09-09: 15 mg via INTRAVENOUS
  Filled 2022-09-09: qty 1

## 2022-09-09 NOTE — ED Provider Notes (Signed)
[place] EMERGENCY DEPARTMENT AT [facility] Provider Note   CSN: [id number] Arrival date & time: 09/09/22  1239     History  Chief Complaint  Patient presents with   Flank Pain   Abdominal Pain    Tanya Carroll is a 37 y.o. female.  Patient presents with bilateral upper abdominal pain worse on the right and wraps around to her right lower quadrant pain at times is 10 out of 10 worsening the past 2 days.  No history of similar.  Patient denies any abdominal surgery history.  No fevers or chills no urinary symptoms.  No history of kidney stone.  Patient has not had menstrual cycle in over a decade.  Patient denies persistent fevers.  Patient has history of high blood pressure and cirrhosis.       Home Medications Prior to Admission medications   Medication Sig Start Date End Date Taking? Authorizing Provider  amLODipine (NORVASC) 10 MG tablet Take 1 tablet (10 mg total) by mouth daily. 07/01/19   Grayce Sessions, NP  cephALEXin (KEFLEX) 500 MG capsule Take 1 capsule (500 mg total) by mouth 3 (three) times daily. 10/26/21   Charlynne Pander, MD  furosemide (LASIX) 20 MG tablet Take 20 mg by mouth daily. 09/02/21   [provider]  losartan-hydrochlorothiazide (HYZAAR) 100-25 MG tablet Take 1 tablet by mouth daily. 07/01/19   Grayce Sessions, NP  ondansetron (ZOFRAN) 4 MG tablet Take 1 tablet (4 mg total) by mouth every 6 (six) hours. 10/26/21   Charlynne Pander, MD  Oxycodone HCl 10 MG TABS Take 10 mg by mouth 4 (four) times daily. 08/08/21   [provider]  potassium chloride (KLOR-CON) 10 MEQ tablet TAKE 1 TABLET(10 MEQ) BY MOUTH DAILY Patient taking differently: Take 10 mEq by mouth daily. 08/28/19   Grayce Sessions, NP  Vitamin D, Ergocalciferol, (DRISDOL) 1.25 MG (50000 UNIT) CAPS capsule Take 50,000 Units by mouth once a week. 05/14/21   [provider]      Allergies    Citrus    Review of Systems   Review of Systems   Constitutional:  Negative for chills and fever.  HENT:  Negative for congestion.   Eyes:  Negative for visual disturbance.  Respiratory:  Negative for shortness of breath.   Cardiovascular:  Negative for chest pain.  Gastrointestinal:  Positive for abdominal pain. Negative for vomiting.  Genitourinary:  Positive for flank pain. Negative for dysuria.  Musculoskeletal:  Negative for back pain, neck pain and neck stiffness.  Skin:  Negative for rash.  Neurological:  Negative for light-headedness and headaches.    Physical Exam Updated Vital Signs BP (!) 137/100 (BP Location: Left Arm)   Pulse 82   Temp 98.4 F (36.9 C) (Oral)   Resp 16   Ht 5\' 4"  (1.626 m)   Wt 73.5 kg   SpO2 100%   BMI 27.81 kg/m  Physical Exam Vitals and nursing note reviewed.  Constitutional:      General: She is not in acute distress.    Appearance: She is well-developed.  HENT:     Head: Normocephalic and atraumatic.     Mouth/Throat:     Mouth: Mucous membranes are moist.  Eyes:     General:        Right eye: No discharge.        Left eye: No discharge.     Conjunctiva/sclera: Conjunctivae normal.  Neck:     Trachea: No tracheal  deviation.  Cardiovascular:     Rate and Rhythm: Normal rate.  Pulmonary:     Effort: Pulmonary effort is normal.  Abdominal:     General: There is no distension.     Palpations: Abdomen is soft.     Tenderness: There is abdominal tenderness in the right upper quadrant and right lower quadrant. There is no guarding.  Musculoskeletal:     Cervical back: Normal range of motion and neck supple. No rigidity.  Skin:    General: Skin is warm.     Capillary Refill: Capillary refill takes less than 2 seconds.     Findings: No rash.  Neurological:     General: No focal deficit present.     Mental Status: She is alert.     Cranial Nerves: No cranial nerve deficit.  Psychiatric:        Mood and Affect: Mood normal.     ED Results / Procedures / Treatments   Labs (all  labs ordered are listed, but only abnormal results are displayed) Labs Reviewed  COMPREHENSIVE METABOLIC PANEL - Abnormal; Notable for the following components:      Result Value   CO2 21 (*)    Total Protein 8.5 (*)    AST 44 (*)    Total Bilirubin 1.5 (*)    Anion gap 16 (*)    All other components within normal limits  CBC - Abnormal; Notable for the following components:   Hemoglobin 15.8 (*)    HCT 47.4 (*)    MCV 108.2 (*)    MCH 36.1 (*)    nRBC 0.3 (*)    All other components within normal limits  URINALYSIS, ROUTINE W REFLEX MICROSCOPIC - Abnormal; Notable for the following components:   Color, Urine AMBER (*)    APPearance HAZY (*)    Ketones, ur 5 (*)    Leukocytes,Ua TRACE (*)    Bacteria, UA MANY (*)    All other components within normal limits  LIPASE, BLOOD  HCG, SERUM, QUALITATIVE    EKG None  Radiology CT ABDOMEN PELVIS W CONTRAST  Result Date: 09/09/2022 CLINICAL DATA:  Right lower quadrant abdominal pain. EXAM: CT ABDOMEN AND PELVIS WITH CONTRAST TECHNIQUE: Multidetector CT imaging of the abdomen and pelvis was performed using the standard protocol following bolus administration of intravenous contrast. RADIATION DOSE REDUCTION: This exam was performed according to the departmental dose-optimization program which includes automated exposure control, adjustment of the mA and/or kV according to patient size and/or use of iterative reconstruction technique. CONTRAST:  75mL OMNIPAQUE IOHEXOL 350 MG/ML SOLN COMPARISON:  10/26/2021 FINDINGS: Lower chest: Clear lung bases. Normal heart size without pericardial or pleural effusion. Hepatobiliary: Mild hepatic steatosis and hepatomegaly at 18 cm craniocaudal. Subtle irregular hepatic capsule. Normal gallbladder, without biliary ductal dilatation. Pancreas: Normal, without mass or ductal dilatation. Spleen: Normal in size, without focal abnormality. Adrenals/Urinary Tract: Normal adrenal glands. Normal kidneys, without  hydronephrosis. Normal urinary bladder. Stomach/Bowel: Normal stomach, without wall thickening. Colonic stool burden suggests constipation. Normal terminal ileum and appendix. Normal small bowel. Vascular/Lymphatic: Normal caliber of the aorta and branch vessels. No abdominopelvic adenopathy. Reproductive: Normal uterus. Right ovarian/adnexal 3.0 cm fluid density lesion on 63/3 Other: No significant free fluid.  No free intraperitoneal air. Musculoskeletal: Mild convex left lumbar spine curvature. IMPRESSION: 1. Normal appendix. 2.  Possible constipation. 3. Mild hepatic steatosis and hepatomegaly. Subtle irregular hepatic capsule for which mild cirrhosis cannot be excluded. Correlate with risk factors. 4. Simple cystic lesion within  the right ovary/adnexa can be presumed benign and does not warrant specific imaging follow-up. Electronically Signed   By: Jeronimo Greaves M.D.   On: 09/09/2022 17:58    Procedures Procedures    Medications Ordered in ED Medications  ketorolac (TORADOL) 15 MG/ML injection 15 mg (15 mg Intravenous Given 09/09/22 1712)  iohexol (OMNIPAQUE) 350 MG/ML injection 75 mL (75 mLs Intravenous Contrast Given 09/09/22 1731)    ED Course/ Medical Decision Making/ A&P                                 Medical Decision Making Amount and/or Complexity of Data Reviewed Labs: ordered. Radiology: ordered.  Risk Prescription drug management.   Patient presents with worsening and persistent abdominal discomfort nonfocal area making differential broad including hepatitis, cirrhosis, colon related, biliary, appendicitis, pancreatitis, pyelonephritis, cystitis, other.  Blood work independent reviewed overall reassuring normal white count, normal hemoglobin, kidney function unremarkable, liver function unremarkable, lipase normal no signs of pancreatitis.  Urinalysis had bacteria and trace leukocytes have no urinary symptoms.  Plan for CT abdomen pelvis for further delineation, Toradol for  pain patient is not able to have Tylenol per her report.  CT scan results independently reviewed overall reassuring mild constipation, ovarian cyst benign, possible cirrhosis for which she is following up outpatient for.  Patient improved on reassessment.  Stable for discharge.        Final Clinical Impression(s) / ED Diagnoses Final diagnoses:  Acute right flank pain  Constipation, unspecified constipation type  Right ovarian cyst    Rx / DC Orders ED Discharge Orders     None         Blane Ohara, MD 09/09/22 1835

## 2022-09-09 NOTE — ED Triage Notes (Signed)
Pt came in via POV d/t the past 2 days having pains felt under both her breasts that she describes as a stabbing & Rt flank pain that radiates around to her RLQ in abd. A/Ox4, rates the pain 10/10, denies n/v or any urinary s/s.

## 2022-09-09 NOTE — ED Notes (Signed)
Unsuccessful IV attempt.  IV team has been consulted.

## 2022-09-09 NOTE — Discharge Instructions (Addendum)
Use MiraLAX as needed for constipation like symptoms every day until stools are normal and then stop using. Use ibuprofen every 6 hours needed for pain. Follow-up with primary doctor for reassessment. Ovarian cysts are common you do not need any special follow-up for this.

## 2023-01-30 ENCOUNTER — Emergency Department (HOSPITAL_BASED_OUTPATIENT_CLINIC_OR_DEPARTMENT_OTHER): Payer: 59

## 2023-01-30 ENCOUNTER — Other Ambulatory Visit: Payer: Self-pay

## 2023-01-30 ENCOUNTER — Emergency Department (HOSPITAL_BASED_OUTPATIENT_CLINIC_OR_DEPARTMENT_OTHER)
Admission: EM | Admit: 2023-01-30 | Discharge: 2023-01-31 | Disposition: A | Discharge status: Home / Self Care | Payer: 59 | Attending: Emergency Medicine | Admitting: Emergency Medicine

## 2023-01-30 ENCOUNTER — Encounter (HOSPITAL_BASED_OUTPATIENT_CLINIC_OR_DEPARTMENT_OTHER): Payer: Self-pay | Admitting: Urology

## 2023-01-30 DIAGNOSIS — K760 Fatty (change of) liver, not elsewhere classified: Secondary | ICD-10-CM | POA: Diagnosis not present

## 2023-01-30 DIAGNOSIS — I10 Essential (primary) hypertension: Secondary | ICD-10-CM | POA: Diagnosis not present

## 2023-01-30 DIAGNOSIS — R072 Precordial pain: Secondary | ICD-10-CM

## 2023-01-30 DIAGNOSIS — R079 Chest pain, unspecified: Secondary | ICD-10-CM | POA: Diagnosis present

## 2023-01-30 DIAGNOSIS — R1010 Upper abdominal pain, unspecified: Secondary | ICD-10-CM

## 2023-01-30 DIAGNOSIS — R9431 Abnormal electrocardiogram [ECG] [EKG]: Secondary | ICD-10-CM

## 2023-01-30 LAB — CBC
HCT: 43 % (ref 36.0–46.0)
Hemoglobin: 14.6 g/dL (ref 12.0–15.0)
MCH: 36.4 pg — ABNORMAL HIGH (ref 26.0–34.0)
MCHC: 34 g/dL (ref 30.0–36.0)
MCV: 107.2 fL — ABNORMAL HIGH (ref 80.0–100.0)
Platelets: 199 10*3/uL (ref 150–400)
RBC: 4.01 MIL/uL (ref 3.87–5.11)
RDW: 12.5 % (ref 11.5–15.5)
WBC: 5.1 10*3/uL (ref 4.0–10.5)
nRBC: 0 % (ref 0.0–0.2)

## 2023-01-30 LAB — LIPASE, BLOOD: Lipase: 29 U/L (ref 11–51)

## 2023-01-30 LAB — BASIC METABOLIC PANEL
Anion gap: 10 (ref 5–15)
BUN: 6 mg/dL (ref 6–20)
CO2: 28 mmol/L (ref 22–32)
Calcium: 8.9 mg/dL (ref 8.9–10.3)
Chloride: 96 mmol/L — ABNORMAL LOW (ref 98–111)
Creatinine, Ser: 0.53 mg/dL (ref 0.44–1.00)
GFR, Estimated: >60 mL/min (ref >60)
Glucose, Bld: 111 mg/dL — ABNORMAL HIGH (ref 70–99)
Potassium: 3.4 mmol/L — ABNORMAL LOW (ref 3.5–5.1)
Sodium: 134 mmol/L — ABNORMAL LOW (ref 135–145)

## 2023-01-30 LAB — D-DIMER, QUANTITATIVE: D-Dimer, Quant: 0.3 ug{FEU}/mL (ref 0.00–0.50)

## 2023-01-30 LAB — HEPATIC FUNCTION PANEL
ALT: 62 U/L — ABNORMAL HIGH (ref 0–44)
AST: 311 U/L — ABNORMAL HIGH (ref 15–41)
Albumin: 4 g/dL (ref 3.5–5.0)
Alkaline Phosphatase: 102 U/L (ref 38–126)
Bilirubin, Direct: 0.7 mg/dL — ABNORMAL HIGH (ref 0.0–0.2)
Indirect Bilirubin: 1.5 mg/dL — ABNORMAL HIGH (ref 0.3–0.9)
Total Bilirubin: 2.2 mg/dL — ABNORMAL HIGH (ref 0.0–1.2)
Total Protein: 8 g/dL (ref 6.5–8.1)

## 2023-01-30 LAB — TROPONIN I (HIGH SENSITIVITY)
Troponin I (High Sensitivity): 3 ng/L (ref <18)
Troponin I (High Sensitivity): 2 ng/L (ref <18)

## 2023-01-30 MED ORDER — ALUM & MAG HYDROXIDE-SIMETH 200-200-20 MG/5ML PO SUSP
30.0000 mL | Freq: Once | ORAL | Status: AC
  Administered 2023-01-30: 30 mL via ORAL
  Filled 2023-01-30: qty 30

## 2023-01-30 NOTE — ED Notes (Signed)
 [place] at the bedside

## 2023-01-30 NOTE — ED Notes (Signed)
Lab contacted about add ons

## 2023-01-30 NOTE — ED Triage Notes (Signed)
 Chest pain that started yesterday but has worsened today, states slight SOB and abdominal pain as well  States central chest pain  Took prescribed oxycodone with no relief  Denies N/V, denies fever

## 2023-01-30 NOTE — ED Notes (Signed)
Blue top sent to lab if needed.

## 2023-01-31 ENCOUNTER — Other Ambulatory Visit: Payer: Self-pay

## 2023-01-31 DIAGNOSIS — R072 Precordial pain: Secondary | ICD-10-CM | POA: Diagnosis not present

## 2023-01-31 LAB — TROPONIN I (HIGH SENSITIVITY): Troponin I (High Sensitivity): 3 ng/L (ref <18)

## 2023-01-31 LAB — HCG, SERUM, QUALITATIVE: Preg, Serum: NEGATIVE

## 2023-01-31 MED ORDER — OMEPRAZOLE 20 MG PO CPDR: 20.0000 mg | DELAYED_RELEASE_CAPSULE | Freq: Every day | ORAL | 0 refills | Status: DC

## 2023-01-31 MED ORDER — POTASSIUM CHLORIDE CRYS ER 20 MEQ PO TBCR
40.0000 meq | EXTENDED_RELEASE_TABLET | Freq: Once | ORAL | Status: AC
  Administered 2023-01-31: 40 meq via ORAL
  Filled 2023-01-31: qty 2

## 2023-01-31 MED ORDER — DICYCLOMINE HCL 20 MG PO TABS: 20.0000 mg | ORAL_TABLET | Freq: Two times a day (BID) | ORAL | 0 refills | Status: DC

## 2023-01-31 NOTE — Discharge Instructions (Addendum)
 As we discussed, your workup in the ER today was reassuring for acute findings.  Laboratory evaluation, x-ray, and ultrasound imaging did not reveal any emergent cause of your symptoms.  Given that you are having some upper abdominal pain, I have given you a prescription for Prilosec which is an antacid medication for you to take as prescribed every day 30 minutes prior to your first meal of the day.  I have also given you Bentyl  which is an antispasmodic agent that can help with stomach cramps.  Take this as prescribed as needed.  You do have some fatty liver disease which is not new, I recommend that you discuss management of this with your primary care doctor. Your EKG is abnormal and you should follow-up with the cardiologist for an echocardiogram and stress test for further evaluation of this. Return if development of any new or worsening symptoms including exertional chest pain, pain associate with shortness of breath, nausea, vomiting, sweating or any other concerns.

## 2023-01-31 NOTE — ED Provider Notes (Signed)
 [place] EMERGENCY DEPARTMENT AT [facility] Provider Note   CSN: [id number] Arrival date & time: 01/30/23  1750     History  Chief Complaint  Patient presents with   Chest Pain    Tanya Carroll is a 38 y.o. female.  Patient with a history of hypertension presents today with complaints of chest pain and shortness of breath. She states that same began yesterday and has been persistent since then. She denies history of similar symptoms previously. Denies cough or congestion. States that her pain is in her left lower chest area as well as her upper abdomen. Denies nausea, vomiting, or diarrhea. Denies urinary symptoms or vaginal discharge. States that she has not had a menstrual cycle in some time and this is normal for her. Denies cardiac history. Denies leg pain or leg swelling. No PND or orthopnea. No recent travel or recent surgeries.  No history of blood clots or malignancy.  Denies any history of tobacco use.  The history is provided by the patient. No language interpreter was used.  Chest Pain Associated symptoms: shortness of breath        Home Medications Prior to Admission medications   Medication Sig Start Date End Date Taking? Authorizing Provider  amLODipine  (NORVASC ) 10 MG tablet Take 1 tablet (10 mg total) by mouth daily. 07/01/19   Celestia Rosaline SQUIBB, NP  cephALEXin  (KEFLEX ) 500 MG capsule Take 1 capsule (500 mg total) by mouth 3 (three) times daily. 10/26/21   Patt Alm Macho, MD  furosemide  (LASIX ) 20 MG tablet Take 20 mg by mouth daily. 09/02/21   [provider]  losartan -hydrochlorothiazide  (HYZAAR) 100-25 MG tablet Take 1 tablet by mouth daily. 07/01/19   Celestia Rosaline SQUIBB, NP  ondansetron  (ZOFRAN ) 4 MG tablet Take 1 tablet (4 mg total) by mouth every 6 (six) hours. 10/26/21   Patt Alm Macho, MD  Oxycodone  HCl 10 MG TABS Take 10 mg by mouth 4 (four) times daily. 08/08/21   [provider]  potassium chloride  (KLOR-CON ) 10 MEQ  tablet TAKE 1 TABLET(10 MEQ) BY MOUTH DAILY Patient taking differently: Take 10 mEq by mouth daily. 08/28/19   Celestia Rosaline SQUIBB, NP  Vitamin D, Ergocalciferol, (DRISDOL) 1.25 MG (50000 UNIT) CAPS capsule Take 50,000 Units by mouth once a week. 05/14/21   [provider]      Allergies    Citrus    Review of Systems   Review of Systems  Respiratory:  Positive for shortness of breath.   Cardiovascular:  Positive for chest pain.  All other systems reviewed and are negative.   Physical Exam Updated Vital Signs BP (!) 133/92   Pulse 99   Temp 98 F (36.7 C) (Oral)   Resp 18   Ht 5' 4 (1.626 m)   Wt 73.5 kg   SpO2 100%   BMI 27.81 kg/m  Physical Exam Vitals and nursing note reviewed.  Constitutional:      General: She is not in acute distress.    Appearance: Normal appearance. She is normal weight. She is not ill-appearing, toxic-appearing or diaphoretic.  HENT:     Head: Normocephalic and atraumatic.  Cardiovascular:     Rate and Rhythm: Normal rate and regular rhythm.     Pulses:          Radial pulses are 2+ on the right side and 2+ on the left side.       Dorsalis pedis pulses are 2+ on the right side and 2+ on  the left side.     Heart sounds: Normal heart sounds.  Pulmonary:     Effort: Pulmonary effort is normal. No respiratory distress.     Breath sounds: Normal breath sounds.  Chest:     Chest wall: No tenderness.  Abdominal:     Tenderness: There is abdominal tenderness in the left upper quadrant. There is no guarding or rebound.  Musculoskeletal:        General: Normal range of motion.     Cervical back: Normal range of motion.     Right lower leg: No tenderness. No edema.     Left lower leg: No tenderness. No edema.  Skin:    General: Skin is warm and dry.  Neurological:     General: No focal deficit present.     Mental Status: She is alert.  Psychiatric:        Mood and Affect: Mood normal.        Behavior: Behavior normal.     ED  Results / Procedures / Treatments   Labs (all labs ordered are listed, but only abnormal results are displayed) Labs Reviewed  BASIC METABOLIC PANEL - Abnormal; Notable for the following components:      Result Value   Sodium 134 (*)    Potassium 3.4 (*)    Chloride 96 (*)    Glucose, Bld 111 (*)    All other components within normal limits  CBC - Abnormal; Notable for the following components:   MCV 107.2 (*)    MCH 36.4 (*)    All other components within normal limits  HEPATIC FUNCTION PANEL - Abnormal; Notable for the following components:   AST 311 (*)    ALT 62 (*)    Total Bilirubin 2.2 (*)    Bilirubin, Direct 0.7 (*)    Indirect Bilirubin 1.5 (*)    All other components within normal limits  LIPASE, BLOOD  D-DIMER, QUANTITATIVE  PREGNANCY, URINE  HCG, SERUM, QUALITATIVE  TROPONIN I (HIGH SENSITIVITY)  TROPONIN I (HIGH SENSITIVITY)    EKG None  Radiology US  Abdomen Limited RUQ (LIVER/GB) Result Date: 01/30/2023 CLINICAL DATA:  Worsening epigastric pain since yesterday EXAM: ULTRASOUND ABDOMEN LIMITED RIGHT UPPER QUADRANT COMPARISON:  09/09/2022 FINDINGS: Gallbladder: No gallstones or wall thickening visualized. No sonographic Murphy sign noted by sonographer. Common bile duct: Diameter: 4 mm Liver: Increased liver echotexture consistent with hepatic steatosis. No focal liver abnormality or intrahepatic biliary duct dilation. Subtle nodularity of the liver capsule compatible cirrhosis. Portal vein is patent on color Doppler imaging with normal direction of blood flow towards the liver. Other: None. IMPRESSION: 1. No evidence of cholelithiasis or cholecystitis. 2. Cirrhosis and hepatic steatosis.  No focal liver abnormality. Electronically Signed   By: Ozell Daring M.D.   On: 01/30/2023 23:45   DG Chest 2 View Result Date: 01/30/2023 CLINICAL DATA:  Chest pain, shortness of breath EXAM: CHEST - 2 VIEW COMPARISON:  03/02/2006 FINDINGS: The heart size and mediastinal contours  are within normal limits. Both lungs are clear. The visualized skeletal structures are unremarkable. IMPRESSION: No active cardiopulmonary disease. Electronically Signed   By: Ryan Salvage M.D.   On: 01/30/2023 20:38    Procedures Procedures    Medications Ordered in ED Medications  alum & mag hydroxide-simeth (MAALOX/MYLANTA) 200-200-20 MG/5ML suspension 30 mL (30 mLs Oral Given 01/30/23 2233)    ED Course/ Medical Decision Making/ A&P  Medical Decision Making Amount and/or Complexity of Data Reviewed Labs: ordered. Radiology: ordered. ECG/medicine tests: ordered.  Risk OTC drugs. Prescription drug management.   This patient is a 38 y.o. female who presents to the ED for concern of chest pain, shortness of breath, upper abdominal pain, this involves an extensive number of treatment options, and is a complaint that carries with it a high risk of complications and morbidity. The emergent differential diagnosis prior to evaluation includes, but is not limited to,    ACS, pericarditis, myocarditis, aortic dissection, PE, pneumothorax, esophageal spasm or rupture, chronic angina, pneumonia, bronchitis, GERD, reflux/PUD, biliary disease, pancreatitis, costochondritis, anxiety, CHF, pericardial effusion/tamponade, arrhythmias, asthma, bronchitis, anemia gastritis, pancreatitis, gastroparesis, malignancy, biliary disease, intestinal ischemia, esophageal rupture, hepatitis, pregnancy  This is not an exhaustive differential.   Past Medical History / Co-morbidities / Social History:  has a past medical history of Hypertension and Placenta previa.  Additional history: Chart reviewed. Pertinent results include: patient with hepatic steatosis seen on prior imaging  Physical Exam: Physical exam performed. The pertinent findings include: Speaking in complete sentences, lung sounds clear to auscultation in all fields, left upper abdominal tenderness to  palpation that is mild without rebound or guarding  Lab Tests: I ordered, and personally interpreted labs.  The pertinent results include:  Na 134, K 3.4, chloride 96, troponin WNL, d dimer WNL, Lipase WNL, AST 311, ALT 62, t bili 2.2 (mild increase from baseline, however liver enzymes are elevated at baseline)   Imaging Studies: I ordered imaging studies including CXR, RUQ US . I independently visualized and interpreted imaging which showed  CXR: NAD  US :   1. No evidence of cholelithiasis or cholecystitis. 2. Cirrhosis and hepatic steatosis.  No focal liver abnormality.  I agree with the radiologist interpretation.   Cardiac Monitoring:  The patient was maintained on a cardiac monitor.  Cardiac monitor showed an underlying rhythm of: sinus tachycardia, no STEMI. I agree with this interpretation.   Medications: I ordered medication including Gi cocktail, oral potassium  for pain, hypokalemia. Reevaluation of the patient after these medicines showed that the patient improved. I have reviewed the patients home medicines and have made adjustments as needed.   Disposition:  Patient's workup is overall benign, her pain did improve with a GI cocktail and therefore we will treat for acid reflux with Prilosec.  Also give Bentyl  for stomach cramps.  She has been unable to give a urine sample and therefore has not had a pregnancy test yet, I did add on a serum hCG, however given that she is sexually active and not on birth control with last menstrual cycle being some time ago (notes irregular cycles at baseline), feel that having a negative pregnancy test is required before discharge.  Discussed with patient is understanding and in agreement with this.  This is pending at shift change.  Care handoff to EDP Dr. Carita at shift change. Please see their note for continued evaluation and dispo  Final Clinical Impression(s) / ED Diagnoses Final diagnoses:  Precordial chest pain  Upper abdominal  pain  Hepatic steatosis    Rx / DC Orders ED Discharge Orders     None         Nora Lauraine LABOR, PA-C 02/02/23 0011    Pamella Ozell LABOR, DO 02/05/23 1406

## 2023-01-31 NOTE — ED Provider Notes (Signed)
 hCG is negative.  Troponin remains negative.  EKG shows T wave inversions in V2 and V3 similar to previous.  This was present in 2021.  Will refer to cardiology for further evaluation.   Glynn Octave, MD 01/31/23 [id number]

## 2023-01-31 NOTE — ED Notes (Signed)
 Reviewed D/C information with the patient, pt verbalized understanding. No additional concerns at this time.

## 2023-05-07 ENCOUNTER — Encounter (HOSPITAL_BASED_OUTPATIENT_CLINIC_OR_DEPARTMENT_OTHER): Payer: Self-pay | Admitting: Urology

## 2023-05-07 ENCOUNTER — Other Ambulatory Visit: Payer: Self-pay

## 2023-05-07 ENCOUNTER — Emergency Department (HOSPITAL_BASED_OUTPATIENT_CLINIC_OR_DEPARTMENT_OTHER)

## 2023-05-07 ENCOUNTER — Emergency Department (HOSPITAL_BASED_OUTPATIENT_CLINIC_OR_DEPARTMENT_OTHER)
Admission: EM | Admit: 2023-05-07 | Discharge: 2023-05-08 | Disposition: A | Discharge status: Home / Self Care | Attending: Emergency Medicine | Admitting: Emergency Medicine

## 2023-05-07 DIAGNOSIS — A599 Trichomoniasis, unspecified: Secondary | ICD-10-CM | POA: Diagnosis not present

## 2023-05-07 DIAGNOSIS — K5900 Constipation, unspecified: Secondary | ICD-10-CM

## 2023-05-07 DIAGNOSIS — I10 Essential (primary) hypertension: Secondary | ICD-10-CM | POA: Insufficient documentation

## 2023-05-07 DIAGNOSIS — R112 Nausea with vomiting, unspecified: Secondary | ICD-10-CM | POA: Diagnosis present

## 2023-05-07 DIAGNOSIS — N3000 Acute cystitis without hematuria: Secondary | ICD-10-CM | POA: Diagnosis not present

## 2023-05-07 LAB — COMPREHENSIVE METABOLIC PANEL WITH GFR
ALT: 26 U/L (ref 0–44)
AST: 47 U/L — ABNORMAL HIGH (ref 15–41)
Albumin: 3.5 g/dL (ref 3.5–5.0)
Alkaline Phosphatase: 94 U/L (ref 38–126)
Anion gap: 10 (ref 5–15)
BUN: <5 mg/dL — ABNORMAL LOW (ref 6–20)
CO2: 29 mmol/L (ref 22–32)
Calcium: 9.1 mg/dL (ref 8.9–10.3)
Chloride: 98 mmol/L (ref 98–111)
Creatinine, Ser: 0.54 mg/dL (ref 0.44–1.00)
GFR, Estimated: >60 mL/min (ref >60)
Glucose, Bld: 107 mg/dL — ABNORMAL HIGH (ref 70–99)
Potassium: 3.6 mmol/L (ref 3.5–5.1)
Sodium: 137 mmol/L (ref 135–145)
Total Bilirubin: 1.4 mg/dL — ABNORMAL HIGH (ref 0.0–1.2)
Total Protein: 7.8 g/dL (ref 6.5–8.1)

## 2023-05-07 LAB — CBC
HCT: 42.9 % (ref 36.0–46.0)
Hemoglobin: 14.9 g/dL (ref 12.0–15.0)
MCH: 37.9 pg — ABNORMAL HIGH (ref 26.0–34.0)
MCHC: 34.7 g/dL (ref 30.0–36.0)
MCV: 109.2 fL — ABNORMAL HIGH (ref 80.0–100.0)
Platelets: 228 10*3/uL (ref 150–400)
RBC: 3.93 MIL/uL (ref 3.87–5.11)
RDW: 13.2 % (ref 11.5–15.5)
WBC: 5.4 10*3/uL (ref 4.0–10.5)
nRBC: 0 % (ref 0.0–0.2)

## 2023-05-07 LAB — LIPASE, BLOOD: Lipase: 24 U/L (ref 11–51)

## 2023-05-07 LAB — URINALYSIS, ROUTINE W REFLEX MICROSCOPIC
Bilirubin Urine: NEGATIVE
Glucose, UA: NEGATIVE mg/dL
Hgb urine dipstick: NEGATIVE
Ketones, ur: NEGATIVE mg/dL
Nitrite: POSITIVE — AB
Protein, ur: NEGATIVE mg/dL
Specific Gravity, Urine: 1.01 (ref 1.005–1.030)
pH: 7 (ref 5.0–8.0)

## 2023-05-07 LAB — URINALYSIS, MICROSCOPIC (REFLEX)

## 2023-05-07 LAB — PREGNANCY, URINE: Preg Test, Ur: NEGATIVE

## 2023-05-07 MED ORDER — METRONIDAZOLE 500 MG PO TABS: 2000.0000 mg | ORAL_TABLET | Freq: Once | ORAL | 0 refills | Status: AC

## 2023-05-07 MED ORDER — SODIUM CHLORIDE 0.9 % IV BOLUS
1000.0000 mL | Freq: Once | INTRAVENOUS | Status: AC
  Administered 2023-05-07: 1000 mL via INTRAVENOUS

## 2023-05-07 MED ORDER — CEPHALEXIN 500 MG PO CAPS
500.0000 mg | ORAL_CAPSULE | Freq: Three times a day (TID) | ORAL | 0 refills | Status: AC
Start: 2023-05-07 — End: 2023-05-12

## 2023-05-07 MED ORDER — POLYETHYLENE GLYCOL 3350 17 G PO PACK: 17.0000 g | PACK | Freq: Every day | ORAL | 0 refills | Status: DC

## 2023-05-07 MED ORDER — IOHEXOL 300 MG/ML  SOLN
100.0000 mL | Freq: Once | INTRAMUSCULAR | Status: AC | PRN
  Administered 2023-05-07: 100 mL via INTRAVENOUS

## 2023-05-07 MED ORDER — MORPHINE SULFATE (PF) 4 MG/ML IV SOLN
4.0000 mg | Freq: Once | INTRAVENOUS | Status: AC
  Administered 2023-05-07: 4 mg via INTRAVENOUS
  Filled 2023-05-07: qty 1

## 2023-05-07 MED ORDER — ONDANSETRON HCL 4 MG/2ML IJ SOLN
4.0000 mg | Freq: Once | INTRAMUSCULAR | Status: AC
  Administered 2023-05-07: 4 mg via INTRAVENOUS
  Filled 2023-05-07: qty 2

## 2023-05-07 MED ORDER — PANTOPRAZOLE SODIUM 40 MG IV SOLR
40.0000 mg | Freq: Once | INTRAVENOUS | Status: AC
  Administered 2023-05-07: 40 mg via INTRAVENOUS
  Filled 2023-05-07: qty 10

## 2023-05-07 NOTE — ED Triage Notes (Signed)
 Has been vomiting x 2 days  Not eating well and rib cage is hurting due to vomiting   Took Oxycodone x 2 with some relief

## 2023-05-07 NOTE — Discharge Instructions (Signed)
 Take the medications as prescribed.  Follow-up with your primary care doctor to be rechecked.  Return to the ER for worsening symptoms.

## 2023-05-07 NOTE — ED Provider Notes (Signed)
 [place] [facility] Provider Note   CSN: [id number] Arrival date & time: 05/07/23  1756     History  Chief Complaint  Patient presents with   Emesis    Tanya Carroll is a 38 y.o. female.   Emesis    Patient has a history of hypertension fatty liver, cirrhosis who presents to the ED for evaluation of vomiting and abdominal pain.  Patient states she started having symptoms a couple days ago.  She has had episodes of nausea and vomiting.  She also started experiencing pain in her upper abdomen.  She feels like it goes into her chest.  No diarrhea no dysuria.  Patient is having several episodes of vomiting per day.  Home Medications Prior to Admission medications   Medication Sig Start Date End Date Taking? Authorizing Provider  cephALEXin (KEFLEX) 500 MG capsule Take 1 capsule (500 mg total) by mouth 3 (three) times daily for 5 days. 05/07/23 05/12/23 Yes Linwood Dibbles, MD  metroNIDAZOLE (FLAGYL) 500 MG tablet Take 4 tablets (2,000 mg total) by mouth once for 1 dose. 05/08/23 05/08/23 Yes Linwood Dibbles, MD  polyethylene glycol (MIRALAX) 17 g packet Take 17 g by mouth daily. 05/07/23  Yes Linwood Dibbles, MD  amLODipine (NORVASC) 10 MG tablet Take 1 tablet (10 mg total) by mouth daily. 07/01/19   Grayce Sessions, NP  dicyclomine (BENTYL) 20 MG tablet Take 1 tablet (20 mg total) by mouth 2 (two) times daily. 01/31/23   Smoot, Shawn Route, PA-C  furosemide (LASIX) 20 MG tablet Take 20 mg by mouth daily. 09/02/21   [provider]  losartan-hydrochlorothiazide (HYZAAR) 100-25 MG tablet Take 1 tablet by mouth daily. 07/01/19   Grayce Sessions, NP  omeprazole (PRILOSEC) 20 MG capsule Take 1 capsule (20 mg total) by mouth daily for 14 days. 01/31/23 02/14/23  Smoot, Shawn Route, PA-C  ondansetron (ZOFRAN) 4 MG tablet Take 1 tablet (4 mg total) by mouth every 6 (six) hours. 10/26/21   Charlynne Pander, MD  Oxycodone HCl 10 MG TABS Take 10 mg by mouth 4 (four) times  daily. 08/08/21   [provider]  potassium chloride (KLOR-CON) 10 MEQ tablet TAKE 1 TABLET(10 MEQ) BY MOUTH DAILY Patient taking differently: Take 10 mEq by mouth daily. 08/28/19   Grayce Sessions, NP  Vitamin D, Ergocalciferol, (DRISDOL) 1.25 MG (50000 UNIT) CAPS capsule Take 50,000 Units by mouth once a week. 05/14/21   [provider]      Allergies    Citrus    Review of Systems   Review of Systems  Gastrointestinal:  Positive for vomiting.    Physical Exam Updated Vital Signs BP (!) 153/108   Pulse 95   Temp 98.1 F (36.7 C) (Oral)   Resp 20   Ht 1.626 m (5\' 4" )   Wt 73.5 kg   SpO2 98%   BMI 27.81 kg/m  Physical Exam Vitals and nursing note reviewed.  Constitutional:      General: She is not in acute distress.    Appearance: She is well-developed.  HENT:     Head: Normocephalic and atraumatic.     Right Ear: External ear normal.     Left Ear: External ear normal.  Eyes:     General: No scleral icterus.       Right eye: No discharge.        Left eye: No discharge.     Conjunctiva/sclera: Conjunctivae normal.  Neck:  Trachea: No tracheal deviation.  Cardiovascular:     Rate and Rhythm: Normal rate and regular rhythm.  Pulmonary:     Effort: Pulmonary effort is normal. No respiratory distress.     Breath sounds: Normal breath sounds. No stridor. No wheezing or rales.  Abdominal:     General: Bowel sounds are normal. There is no distension.     Palpations: Abdomen is soft.     Tenderness: There is abdominal tenderness in the right upper quadrant and epigastric area. There is no guarding or rebound.  Musculoskeletal:        General: No tenderness or deformity.     Cervical back: Neck supple.  Skin:    General: Skin is warm and dry.     Findings: No rash.  Neurological:     General: No focal deficit present.     Mental Status: She is alert.     Cranial Nerves: No cranial nerve deficit, dysarthria or facial asymmetry.     Sensory: No  sensory deficit.     Motor: No abnormal muscle tone or seizure activity.     Coordination: Coordination normal.  Psychiatric:        Mood and Affect: Mood normal.     ED Results / Procedures / Treatments   Labs (all labs ordered are listed, but only abnormal results are displayed) Labs Reviewed  COMPREHENSIVE METABOLIC PANEL WITH GFR - Abnormal; Notable for the following components:      Result Value   Glucose, Bld 107 (*)    BUN <5 (*)    AST 47 (*)    Total Bilirubin 1.4 (*)    All other components within normal limits  CBC - Abnormal; Notable for the following components:   MCV 109.2 (*)    MCH 37.9 (*)    All other components within normal limits  URINALYSIS, ROUTINE W REFLEX MICROSCOPIC - Abnormal; Notable for the following components:   APPearance HAZY (*)    Nitrite POSITIVE (*)    Leukocytes,Ua SMALL (*)    All other components within normal limits  URINALYSIS, MICROSCOPIC (REFLEX) - Abnormal; Notable for the following components:   Bacteria, UA MANY (*)    Trichomonas, UA PRESENT (*)    All other components within normal limits  URINE CULTURE  LIPASE, BLOOD  PREGNANCY, URINE    EKG None  Radiology DG Chest Portable 1 View Result Date: 05/07/2023 CLINICAL DATA:  Chest and abdominal pain.  Vomiting. EXAM: PORTABLE CHEST 1 VIEW COMPARISON:  01/30/2023 FINDINGS: The cardiomediastinal contours are normal. Mild chronic elevation of right hemidiaphragm. Pulmonary vasculature is normal. No consolidation, pleural effusion, or pneumothorax. No acute osseous abnormalities are seen. IMPRESSION: No active disease. Electronically Signed   By: Narda Rutherford M.D.   On: 05/07/2023 23:26   CT ABDOMEN PELVIS W CONTRAST Result Date: 05/07/2023 CLINICAL DATA:  Abdominal pain.  Vomiting. EXAM: CT ABDOMEN AND PELVIS WITH CONTRAST TECHNIQUE: Multidetector CT imaging of the abdomen and pelvis was performed using the standard protocol following bolus administration of intravenous  contrast. RADIATION DOSE REDUCTION: This exam was performed according to the departmental dose-optimization program which includes automated exposure control, adjustment of the mA and/or kV according to patient size and/or use of iterative reconstruction technique. CONTRAST:  OMNIPAQUE IOHEXOL 300 MG/ML  SOLN COMPARISON:  CT 09/09/2022 FINDINGS: Lower chest: Mild compressive atelectasis in the right lower lobe adjacent to elevated hemidiaphragm. Hepatobiliary: Diffuse hepatic steatosis. Liver is enlarged spanning 18.8 cm cranial caudal. No focal hepatic  abnormality. Unremarkable gallbladder. Pancreas: No ductal dilatation or inflammation. Spleen: Normal in size without focal abnormality. Adrenals/Urinary Tract: No adrenal nodule. No hydronephrosis or renal calculi. No focal renal abnormality. Decompressed urinary bladder. Stomach/Bowel: The stomach is nondistended. There is no small bowel obstruction or inflammatory change. The appendix is normal. Moderate to large volume of stool throughout the colon. No colonic wall thickening or pericolonic edema. Vascular/Lymphatic: Normal caliber abdominal aorta. Patent portal vein. No abdominopelvic adenopathy. Reproductive: Simple appearing 3 cm right ovarian/adnexal cyst. Similar appearing cyst was seen on prior CT. There is no internal complexity by CT. Uterus is unremarkable. Other: Trace free fluid in the pelvis. No abdominal ascites. No free air. Diminutive fat containing umbilical hernia. Musculoskeletal: There are no acute or suspicious osseous abnormalities. Mild scoliotic curvature of the spine. Chronic avascular necrosis of both femoral heads. IMPRESSION: 1. No acute abnormality in the abdomen/pelvis. 2. Moderate to large volume of stool throughout the colon. 3. Hepatomegaly and hepatic steatosis. 4. Simple appearing 3 cm right ovarian/adnexal cyst. Per consensus guidelines, no specific imaging follow-up is recommended. 5. Mild chronic bilateral avascular  necrosis of the femoral heads. Electronically Signed   By: Narda Rutherford M.D.   On: 05/07/2023 23:25    Procedures Procedures    Medications Ordered in ED Medications  sodium chloride 0.9 % bolus 1,000 mL (0 mLs Intravenous Stopped 05/07/23 2211)  ondansetron (ZOFRAN) injection 4 mg (4 mg Intravenous Given 05/07/23 2017)  morphine (PF) 4 MG/ML injection 4 mg (4 mg Intravenous Given 05/07/23 2018)  pantoprazole (PROTONIX) injection 40 mg (40 mg Intravenous Given 05/07/23 2112)  iohexol (OMNIPAQUE) 300 MG/ML solution 100 mL (100 mLs Intravenous Contrast Given 05/07/23 2051)    ED Course/ Medical Decision Making/ A&P Clinical Course as of 05/07/23 2354  Wed May 07, 2023  2248 Urinalysis does show many bacteria as well as 11-20 white cells patient also has trichomonas in the urinalysis [JK]  2333 CT scan does not show any acute abnormality.  Patient has some moderate to large volume stool throughout the colon. [JK]    Clinical Course User Index [JK] Linwood Dibbles, MD                                 Medical Decision Making Amount and/or Complexity of Data Reviewed Labs: ordered. Radiology: ordered.  Risk Prescription drug management.   Patient presented to the ED for evaluation of abdominal pain.  Consider the possibility of colitis diverticulitis pancreatitis ureterolithiasis.  Patient's laboratory tests are reassuring.  No leukocytosis noted.  No signs of hepatitis.  Bilirubin elevated but is stable.  No signs of pancreatitis.  Urinalysis did suggest a possible urinary tract infection.  She also was noted to have trichomoniasis.  CT scan was performed and does not show any acute abnormality.  Will start the patient on MiraLAX as well as Keflex and metronidazole.  Recommend outpatient follow-up PCP.        Final Clinical Impression(s) / ED Diagnoses Final diagnoses:  Acute cystitis without hematuria  Trichomoniasis  Constipation, unspecified constipation type    Rx / DC  Orders ED Discharge Orders          Ordered    metroNIDAZOLE (FLAGYL) 500 MG tablet   Once        05/07/23 2354    polyethylene glycol (MIRALAX) 17 g packet  Daily        05/07/23 2354    cephALEXin (  KEFLEX) 500 MG capsule  3 times daily        05/07/23 2354              Linwood Dibbles, MD 05/07/23 2354

## 2023-05-08 DIAGNOSIS — K5900 Constipation, unspecified: Secondary | ICD-10-CM | POA: Diagnosis not present

## 2023-05-08 MED ORDER — CEPHALEXIN 250 MG PO CAPS
500.0000 mg | ORAL_CAPSULE | Freq: Once | ORAL | Status: AC
  Administered 2023-05-08: 500 mg via ORAL
  Filled 2023-05-08: qty 2

## 2023-05-08 MED ORDER — METRONIDAZOLE 500 MG PO TABS
500.0000 mg | ORAL_TABLET | Freq: Once | ORAL | Status: AC
  Administered 2023-05-08: 500 mg via ORAL
  Filled 2023-05-08: qty 1

## 2023-05-08 NOTE — ED Notes (Signed)
 Lab called to add on urine culture.  ?

## 2023-05-08 NOTE — ED Notes (Addendum)
 Patient states that MD told her she would be getting dose of antibiotics before discharge. MD consulted and medications prescribed.

## 2023-05-08 NOTE — ED Notes (Signed)
 Questions and concerns addressed. Discharge teaching completed.   Prescriptions reviewed and pharmacy verified.   Pt ambulatory upon discharge.

## 2023-05-10 LAB — URINE CULTURE: Culture: >=100000 — AB

## 2023-05-11 ENCOUNTER — Telehealth (HOSPITAL_BASED_OUTPATIENT_CLINIC_OR_DEPARTMENT_OTHER): Payer: Self-pay

## 2023-05-11 NOTE — Telephone Encounter (Signed)
 Post ED Visit - Positive Culture Follow-up  Culture report reviewed by antimicrobial stewardship pharmacist: [facility] []  Court Distance, Pharm.D. []  Skeet Duke, Pharm.D., BCPS AQ-ID [x]   Dionicio Fray, Pharm.D., BCPS []  Garland Junk, Pharm.D., BCPS []  [place], [address].D., BCPS, AAHIVP []  Alcide Aly, Pharm.D., BCPS, AAHIVP []  Jerri Morale, PharmD, BCPS []  Graham Laws, PharmD, BCPS []  Cleda Curly, PharmD, BCPS []  Tamar Fairly, PharmD []  Ballard Levels, PharmD, BCPS []  Ollen Beverage, PharmD  [facility] []  Arlyne Bering, PharmD []  Sherryle Don, PharmD []  Van Gelinas, PharmD []  Delila Felty, Rph []  [place]una [place]) Cleora Daft, PharmD []  Augustina Block, PharmD []  Arie Kurtz, PharmD []  Sharlyn Deaner, PharmD []  Agnes Hose, PharmD []  Kendall Pauls, PharmD []  Gladstone Lamer, PharmD []  Armanda Bern, PharmD []  Tera Fellows, PharmD   Positive urine culture Treated with Cephalexin, organism sensitive to the same and no further patient follow-up is required at this time.  Delena Feil 05/11/2023, 3:45 PM

## 2023-05-21 ENCOUNTER — Other Ambulatory Visit: Payer: Self-pay

## 2023-05-21 ENCOUNTER — Encounter (HOSPITAL_BASED_OUTPATIENT_CLINIC_OR_DEPARTMENT_OTHER): Payer: Self-pay

## 2023-05-21 ENCOUNTER — Emergency Department (HOSPITAL_BASED_OUTPATIENT_CLINIC_OR_DEPARTMENT_OTHER)
Admission: EM | Admit: 2023-05-21 | Discharge: 2023-05-21 | Disposition: A | Discharge status: Home / Self Care | Attending: Emergency Medicine | Admitting: Emergency Medicine

## 2023-05-21 DIAGNOSIS — K219 Gastro-esophageal reflux disease without esophagitis: Secondary | ICD-10-CM | POA: Insufficient documentation

## 2023-05-21 DIAGNOSIS — R0602 Shortness of breath: Secondary | ICD-10-CM | POA: Diagnosis present

## 2023-05-21 DIAGNOSIS — R1013 Epigastric pain: Secondary | ICD-10-CM | POA: Diagnosis present

## 2023-05-21 HISTORY — DX: Polyneuropathy, unspecified: G62.9

## 2023-05-21 LAB — CBC WITH DIFFERENTIAL/PLATELET
Abs Immature Granulocytes: 0.02 10*3/uL (ref 0.00–0.07)
Basophils Absolute: 0 10*3/uL (ref 0.0–0.1)
Basophils Relative: 0 %
Eosinophils Absolute: 0 10*3/uL (ref 0.0–0.5)
Eosinophils Relative: 0 %
HCT: 42.4 % (ref 36.0–46.0)
Hemoglobin: 14.6 g/dL (ref 12.0–15.0)
Immature Granulocytes: 0 %
Lymphocytes Relative: 32 %
Lymphs Abs: 2 10*3/uL (ref 0.7–4.0)
MCH: 37.8 pg — ABNORMAL HIGH (ref 26.0–34.0)
MCHC: 34.4 g/dL (ref 30.0–36.0)
MCV: 109.8 fL — ABNORMAL HIGH (ref 80.0–100.0)
Monocytes Absolute: 1.2 10*3/uL — ABNORMAL HIGH (ref 0.1–1.0)
Monocytes Relative: 19 %
Neutro Abs: 3 10*3/uL (ref 1.7–7.7)
Neutrophils Relative %: 49 %
Platelets: 226 10*3/uL (ref 150–400)
RBC: 3.86 MIL/uL — ABNORMAL LOW (ref 3.87–5.11)
RDW: 13 % (ref 11.5–15.5)
WBC: 6.2 10*3/uL (ref 4.0–10.5)
nRBC: 0 % (ref 0.0–0.2)

## 2023-05-21 LAB — RESP PANEL BY RT-PCR (RSV, FLU A&B, COVID)  RVPGX2
Influenza A by PCR: NEGATIVE
Influenza B by PCR: NEGATIVE
Resp Syncytial Virus by PCR: NEGATIVE
SARS Coronavirus 2 by RT PCR: NEGATIVE

## 2023-05-21 LAB — LIPASE, BLOOD: Lipase: 23 U/L (ref 11–51)

## 2023-05-21 LAB — COMPREHENSIVE METABOLIC PANEL WITH GFR
ALT: 27 U/L (ref 0–44)
AST: 78 U/L — ABNORMAL HIGH (ref 15–41)
Albumin: 4 g/dL (ref 3.5–5.0)
Alkaline Phosphatase: 107 U/L (ref 38–126)
Anion gap: 17 — ABNORMAL HIGH (ref 5–15)
BUN: <5 mg/dL — ABNORMAL LOW (ref 6–20)
CO2: 27 mmol/L (ref 22–32)
Calcium: 9.1 mg/dL (ref 8.9–10.3)
Chloride: 93 mmol/L — ABNORMAL LOW (ref 98–111)
Creatinine, Ser: 0.75 mg/dL (ref 0.44–1.00)
GFR, Estimated: >60 mL/min (ref >60)
Glucose, Bld: 86 mg/dL (ref 70–99)
Potassium: 3.5 mmol/L (ref 3.5–5.1)
Sodium: 138 mmol/L (ref 135–145)
Total Bilirubin: 2.6 mg/dL — ABNORMAL HIGH (ref 0.0–1.2)
Total Protein: 7.7 g/dL (ref 6.5–8.1)

## 2023-05-21 LAB — D-DIMER, QUANTITATIVE: D-Dimer, Quant: 0.41 ug{FEU}/mL (ref 0.00–0.50)

## 2023-05-21 MED ORDER — ALUM & MAG HYDROXIDE-SIMETH 200-200-20 MG/5ML PO SUSP
30.0000 mL | Freq: Once | ORAL | Status: AC
  Administered 2023-05-21: 30 mL via ORAL
  Filled 2023-05-21: qty 30

## 2023-05-21 MED ORDER — FAMOTIDINE IN NACL 20-0.9 MG/50ML-% IV SOLN
20.0000 mg | Freq: Once | INTRAVENOUS | Status: AC
  Administered 2023-05-21: 20 mg via INTRAVENOUS
  Filled 2023-05-21: qty 50

## 2023-05-21 MED ORDER — FAMOTIDINE 20 MG PO TABS: 20.0000 mg | ORAL_TABLET | Freq: Two times a day (BID) | ORAL | 0 refills | Status: AC | PRN

## 2023-05-21 MED ORDER — OXYCODONE HCL 5 MG PO TABS
5.0000 mg | ORAL_TABLET | Freq: Once | ORAL | Status: AC
  Administered 2023-05-21: 5 mg via ORAL
  Filled 2023-05-21: qty 1

## 2023-05-21 MED ORDER — MAALOX MAX 400-400-40 MG/5ML PO SUSP: 15.0000 mL | Freq: Three times a day (TID) | ORAL | 0 refills | Status: AC | PRN

## 2023-05-21 NOTE — ED Notes (Signed)
 Pt alert and oriented X 4 at the time of discharge. RR even and unlabored. No acute distress noted. Pt verbalized understanding of discharge instructions as discussed. Pt ambulatory to lobby at time of discharge.

## 2023-05-21 NOTE — ED Triage Notes (Signed)
 Pt arrived by EMS with complaints of epigastric pain and chills x 2 days. Seen 2 weeks ago for the same 2 weeks. Dx with constipation and has been taking Miralax . Vomited x 1 yesterday

## 2023-05-21 NOTE — ED Provider Notes (Signed)
 [place] [facility] Provider Note   CSN: [id number] Arrival date & time: 05/21/23  0725     History  Chief Complaint  Patient presents with   Abdominal Pain    Tanya Carroll is a 38 y.o. female.  Patient is a 38 yo female presenting for abdominal pain. Pt admits to epigastric abdominal that is described as tightness, severe, intermittent, with radiation to the back x days. State symptoms resulted in sob and nausea this morning. Admits to nausea and an episode of vomiting yesterday. Chart review demonstrates pt was seen 14 days ago on 04/923 for epigastric, suprapubic abdominal pain, difficulty urinating and was dx with UTI, constipation, and trichomonas after CT abd/pelvis with iv contrast and lab workup. Pt states suprapubic pain and urinary hesitancy has improved since treatment.   The history is provided by the patient. No language interpreter was used.  Abdominal Pain      Home Medications Prior to Admission medications   Medication Sig Start Date End Date Taking? Authorizing Provider  alum & mag hydroxide-simeth (MAALOX MAX) 400-400-40 MG/5ML suspension Take 15 mLs by mouth every 8 (eight) hours as needed for indigestion. 05/21/23  Yes Owen Blowers P, DO  famotidine  (PEPCID ) 20 MG tablet Take 1 tablet (20 mg total) by mouth every 12 (twelve) hours as needed for heartburn or indigestion. 05/21/23  Yes Owen Blowers P, DO  amLODipine  (NORVASC ) 10 MG tablet Take 1 tablet (10 mg total) by mouth daily. 07/01/19   Marius Siemens, NP  dicyclomine  (BENTYL ) 20 MG tablet Take 1 tablet (20 mg total) by mouth 2 (two) times daily. 01/31/23   Smoot, Genevive Ket, PA-C  furosemide (LASIX) 20 MG tablet Take 20 mg by mouth daily. 09/02/21   [provider]  losartan -hydrochlorothiazide  (HYZAAR) 100-25 MG tablet Take 1 tablet by mouth daily. 07/01/19   Marius Siemens, NP  omeprazole  (PRILOSEC) 20 MG capsule Take 1 capsule (20 mg total) by mouth daily  for 14 days. 01/31/23 02/14/23  Smoot, Genevive Ket, PA-C  ondansetron  (ZOFRAN ) 4 MG tablet Take 1 tablet (4 mg total) by mouth every 6 (six) hours. 10/26/21   Dalene Duck, MD  Oxycodone  HCl 10 MG TABS Take 10 mg by mouth 4 (four) times daily. 08/08/21   [provider]  polyethylene glycol (MIRALAX ) 17 g packet Take 17 g by mouth daily. 05/07/23   Trish Furl, MD  potassium chloride  (KLOR-CON ) 10 MEQ tablet TAKE 1 TABLET(10 MEQ) BY MOUTH DAILY Patient taking differently: Take 10 mEq by mouth daily. 08/28/19   Marius Siemens, NP  Vitamin D, Ergocalciferol, (DRISDOL) 1.25 MG (50000 UNIT) CAPS capsule Take 50,000 Units by mouth once a week. 05/14/21   [provider]      Allergies    Citrus    Review of Systems   Review of Systems  Gastrointestinal:  Positive for abdominal pain.    Physical Exam Updated Vital Signs BP (!) 139/101   Pulse (!) 104   Temp 98.5 F (36.9 C)   Resp 13   Wt 78.9 kg   SpO2 99%   BMI 29.87 kg/m  Physical Exam  ED Results / Procedures / Treatments   Labs (all labs ordered are listed, but only abnormal results are displayed) Labs Reviewed  CBC WITH DIFFERENTIAL/PLATELET - Abnormal; Notable for the following components:      Result Value   RBC 3.86 (*)    MCV 109.8 (*)    MCH 37.8 (*)  Monocytes Absolute 1.2 (*)    All other components within normal limits  COMPREHENSIVE METABOLIC PANEL WITH GFR - Abnormal; Notable for the following components:   Chloride 93 (*)    BUN <5 (*)    AST 78 (*)    Total Bilirubin 2.6 (*)    Anion gap 17 (*)    All other components within normal limits  RESP PANEL BY RT-PCR (RSV, FLU A&B, COVID)  RVPGX2  LIPASE, BLOOD  D-DIMER, QUANTITATIVE  URINALYSIS, ROUTINE W REFLEX MICROSCOPIC    EKG EKG Interpretation Date/Time:  Wednesday May 21 2023 07:36:41 EDT Ventricular Rate:  109 PR Interval:  190 QRS Duration:  89 QT Interval:  340 QTC Calculation: 458 R Axis:   50  Text  Interpretation: Sinus tachycardia Probable left atrial enlargement RSR' in V1 or V2, probably normal variant Nonspecific T abnormalities, anterior leads Confirmed by Owen Blowers ([id number]) on 05/21/2023 7:47:04 AM  Radiology No results found.  Procedures Procedures    Medications Ordered in ED Medications  oxyCODONE  (Oxy IR/ROXICODONE ) immediate release tablet 5 mg (has no administration in time range)  famotidine  (PEPCID ) IVPB 20 mg premix (20 mg Intravenous New Bag/Given 05/21/23 0938)  alum & mag hydroxide-simeth (MAALOX/MYLANTA) 200-200-20 MG/5ML suspension 30 mL (30 mLs Oral Given 05/21/23 6962)    ED Course/ Medical Decision Making/ A&P                                 Medical Decision Making Amount and/or Complexity of Data Reviewed Labs: ordered. Radiology: ordered.  Risk OTC drugs. Prescription drug management.   Patient is a 38 year old female presenting for epigastric abdominal pain that radiates to the chest associated with nausea with an episode of nausea and vomiting yesterday.  Patient is alert oriented x 3, no acute distress, afebrile, stable vital signs.  Abdomen is soft with no reproducible tenderness.  Pepcid  and Maalox given for concerns for GERD like symptoms.  Will rule out cardiovascular and GI causes.  Patient has equal bilateral breath sounds with no adventitious lung sounds.  No cardiac murmurs.  Twelve-lead EKG showed sinus tachycardia with a rate of 109.  No ST segment elevation or depression.  Some T wave inversions in anterior leads only.  Troponin within normal limits.  Doubt ACS.  D-dimer negative.  Doubt pulmonary embolism.  Stable electrolytes.  Diminished soft with no reproducible pain.  Stable liver profile, lipase, and renal function.  Patient was seen 14 days ago for similar symptoms with CT abdomen pelvis with contrast demonstrating no acute process.  I do not think that repeating the imaging at this time is indicated especially due to patient's  nontender exam.  She does have an appointment for an upper endoscopy scheduled with GI the first week of May which is in less than 2 weeks.  I recommend that she keep this appointment.  Also recommend that she takes her Prilosec daily for minimum of 14 days.  After our discussion it sounds like she might of been taking this medication as a as needed med.  She took her first dose this morning.  Recommended decrease spicy food, fried food, caffeine use, and alcohol use.  Patient in no distress and overall condition improved here in the ED. Detailed discussions were had with the patient regarding current findings, and need for close f/u with PCP or on call doctor. The patient has been instructed to return immediately if the symptoms worsen  in any way for re-evaluation. Patient verbalized understanding and is in agreement with current care plan. All questions answered prior to discharge.         Final Clinical Impression(s) / ED Diagnoses Final diagnoses:  Gastroesophageal reflux disease, unspecified whether esophagitis present    Rx / DC Orders ED Discharge Orders          Ordered    famotidine  (PEPCID ) 20 MG tablet  Every 12 hours PRN        05/21/23 1050    alum & mag hydroxide-simeth (MAALOX MAX) 400-400-40 MG/5ML suspension  Every 8 hours PRN        05/21/23 1050              Quinn Bucco, DO 05/21/23 1051

## 2023-05-21 NOTE — Discharge Instructions (Addendum)
 Keep your appointment with your GI specialist team for your upper endoscopy study the first week of May.  Please take your Prilosec in the morning before breakfast and at night before dinner every day for 2 weeks minimum.  This will help decrease overall acid production.  I have also sent a prescription for Maalox and Pepcid  to your pharmacy.  This will help with symptoms of upper stomach pain, chest pain, and acid reflux.  Decrease spicy and fried food.  Decrease caffeine use.

## 2023-06-14 ENCOUNTER — Emergency Department (HOSPITAL_COMMUNITY)

## 2023-06-14 ENCOUNTER — Encounter (HOSPITAL_COMMUNITY): Payer: Self-pay

## 2023-06-14 ENCOUNTER — Other Ambulatory Visit: Payer: Self-pay

## 2023-06-14 ENCOUNTER — Inpatient Hospital Stay (HOSPITAL_COMMUNITY)
Admission: EM | Admit: 2023-06-14 | Discharge: 2023-06-19 | DRG: 392 | Disposition: A | Discharge status: Home / Self Care | Attending: Family Medicine | Admitting: Family Medicine

## 2023-06-14 DIAGNOSIS — R1110 Vomiting, unspecified: Secondary | ICD-10-CM | POA: Diagnosis present

## 2023-06-14 DIAGNOSIS — E872 Acidosis, unspecified: Secondary | ICD-10-CM | POA: Diagnosis present

## 2023-06-14 DIAGNOSIS — K7460 Unspecified cirrhosis of liver: Secondary | ICD-10-CM | POA: Diagnosis not present

## 2023-06-14 DIAGNOSIS — R109 Unspecified abdominal pain: Secondary | ICD-10-CM | POA: Diagnosis not present

## 2023-06-14 DIAGNOSIS — I10 Essential (primary) hypertension: Secondary | ICD-10-CM | POA: Diagnosis present

## 2023-06-14 DIAGNOSIS — R000 Tachycardia, unspecified: Secondary | ICD-10-CM | POA: Diagnosis present

## 2023-06-14 DIAGNOSIS — E861 Hypovolemia: Secondary | ICD-10-CM | POA: Diagnosis present

## 2023-06-14 DIAGNOSIS — K30 Functional dyspepsia: Secondary | ICD-10-CM | POA: Diagnosis present

## 2023-06-14 DIAGNOSIS — Z79891 Long term (current) use of opiate analgesic: Secondary | ICD-10-CM

## 2023-06-14 DIAGNOSIS — K5900 Constipation, unspecified: Secondary | ICD-10-CM | POA: Diagnosis not present

## 2023-06-14 DIAGNOSIS — G629 Polyneuropathy, unspecified: Secondary | ICD-10-CM | POA: Diagnosis not present

## 2023-06-14 DIAGNOSIS — E538 Deficiency of other specified B group vitamins: Secondary | ICD-10-CM | POA: Diagnosis present

## 2023-06-14 DIAGNOSIS — G8929 Other chronic pain: Secondary | ICD-10-CM | POA: Diagnosis present

## 2023-06-14 DIAGNOSIS — I959 Hypotension, unspecified: Secondary | ICD-10-CM | POA: Diagnosis not present

## 2023-06-14 DIAGNOSIS — G621 Alcoholic polyneuropathy: Secondary | ICD-10-CM | POA: Diagnosis present

## 2023-06-14 DIAGNOSIS — K5909 Other constipation: Principal | ICD-10-CM | POA: Diagnosis present

## 2023-06-14 DIAGNOSIS — K920 Hematemesis: Secondary | ICD-10-CM | POA: Diagnosis present

## 2023-06-14 DIAGNOSIS — E860 Dehydration: Secondary | ICD-10-CM | POA: Diagnosis present

## 2023-06-14 DIAGNOSIS — D696 Thrombocytopenia, unspecified: Secondary | ICD-10-CM | POA: Diagnosis not present

## 2023-06-14 DIAGNOSIS — R202 Paresthesia of skin: Secondary | ICD-10-CM

## 2023-06-14 DIAGNOSIS — D751 Secondary polycythemia: Secondary | ICD-10-CM | POA: Diagnosis present

## 2023-06-14 DIAGNOSIS — R079 Chest pain, unspecified: Secondary | ICD-10-CM

## 2023-06-14 DIAGNOSIS — Z87892 Personal history of anaphylaxis: Secondary | ICD-10-CM

## 2023-06-14 DIAGNOSIS — Z881 Allergy status to other antibiotic agents status: Secondary | ICD-10-CM

## 2023-06-14 DIAGNOSIS — R531 Weakness: Secondary | ICD-10-CM

## 2023-06-14 DIAGNOSIS — Z8249 Family history of ischemic heart disease and other diseases of the circulatory system: Secondary | ICD-10-CM

## 2023-06-14 DIAGNOSIS — R52 Pain, unspecified: Principal | ICD-10-CM

## 2023-06-14 DIAGNOSIS — D649 Anemia, unspecified: Secondary | ICD-10-CM | POA: Diagnosis not present

## 2023-06-14 DIAGNOSIS — Z79899 Other long term (current) drug therapy: Secondary | ICD-10-CM

## 2023-06-14 DIAGNOSIS — F1011 Alcohol abuse, in remission: Secondary | ICD-10-CM | POA: Diagnosis present

## 2023-06-14 HISTORY — DX: Unspecified cirrhosis of liver: K74.60

## 2023-06-14 LAB — RAPID URINE DRUG SCREEN, HOSP PERFORMED
Amphetamines: NOT DETECTED
Barbiturates: NOT DETECTED
Benzodiazepines: NOT DETECTED
Cocaine: NOT DETECTED
Opiates: POSITIVE — AB
Tetrahydrocannabinol: POSITIVE — AB

## 2023-06-14 LAB — URINALYSIS, ROUTINE W REFLEX MICROSCOPIC
Glucose, UA: NEGATIVE mg/dL
Hgb urine dipstick: NEGATIVE
Ketones, ur: 5 mg/dL — AB
Leukocytes,Ua: NEGATIVE
Nitrite: NEGATIVE
Protein, ur: NEGATIVE mg/dL
Specific Gravity, Urine: 1.045 — ABNORMAL HIGH (ref 1.005–1.030)
pH: 5 (ref 5.0–8.0)

## 2023-06-14 LAB — PROTIME-INR
INR: 1.5 — ABNORMAL HIGH (ref 0.8–1.2)
Prothrombin Time: 18.2 s — ABNORMAL HIGH (ref 11.4–15.2)

## 2023-06-14 LAB — CBC WITH DIFFERENTIAL/PLATELET
Abs Immature Granulocytes: 0.02 10*3/uL (ref 0.00–0.07)
Basophils Absolute: 0 10*3/uL (ref 0.0–0.1)
Basophils Relative: 0 %
Eosinophils Absolute: 0 10*3/uL (ref 0.0–0.5)
Eosinophils Relative: 0 %
HCT: 48.5 % — ABNORMAL HIGH (ref 36.0–46.0)
Hemoglobin: 16 g/dL — ABNORMAL HIGH (ref 12.0–15.0)
Immature Granulocytes: 0 %
Lymphocytes Relative: 34 %
Lymphs Abs: 1.9 10*3/uL (ref 0.7–4.0)
MCH: 38.1 pg — ABNORMAL HIGH (ref 26.0–34.0)
MCHC: 33 g/dL (ref 30.0–36.0)
MCV: 115.5 fL — ABNORMAL HIGH (ref 80.0–100.0)
Monocytes Absolute: 0.6 10*3/uL (ref 0.1–1.0)
Monocytes Relative: 10 %
Neutro Abs: 3.2 10*3/uL (ref 1.7–7.7)
Neutrophils Relative %: 56 %
Platelets: 174 10*3/uL (ref 150–400)
RBC: 4.2 MIL/uL (ref 3.87–5.11)
RDW: 14.4 % (ref 11.5–15.5)
WBC: 5.7 10*3/uL (ref 4.0–10.5)
nRBC: 0 % (ref 0.0–0.2)

## 2023-06-14 LAB — COMPREHENSIVE METABOLIC PANEL WITH GFR
ALT: 19 U/L (ref 0–44)
AST: 50 U/L — ABNORMAL HIGH (ref 15–41)
Albumin: 3.2 g/dL — ABNORMAL LOW (ref 3.5–5.0)
Alkaline Phosphatase: 96 U/L (ref 38–126)
Anion gap: 13 (ref 5–15)
BUN: 6 mg/dL (ref 6–20)
CO2: 24 mmol/L (ref 22–32)
Calcium: 8.6 mg/dL — ABNORMAL LOW (ref 8.9–10.3)
Chloride: 98 mmol/L (ref 98–111)
Creatinine, Ser: 0.6 mg/dL (ref 0.44–1.00)
GFR, Estimated: >60 mL/min (ref >60)
Glucose, Bld: 86 mg/dL (ref 70–99)
Potassium: 3.7 mmol/L (ref 3.5–5.1)
Sodium: 135 mmol/L (ref 135–145)
Total Bilirubin: 3.1 mg/dL — ABNORMAL HIGH (ref 0.0–1.2)
Total Protein: 7.2 g/dL (ref 6.5–8.1)

## 2023-06-14 LAB — I-STAT CG4 LACTIC ACID, ED
Lactic Acid, Venous: 2.6 mmol/L (ref 0.5–1.9)
Lactic Acid, Venous: 4.3 mmol/L (ref 0.5–1.9)

## 2023-06-14 LAB — MAGNESIUM: Magnesium: 1.8 mg/dL (ref 1.7–2.4)

## 2023-06-14 LAB — HCG, SERUM, QUALITATIVE: Preg, Serum: NEGATIVE

## 2023-06-14 LAB — BRAIN NATRIURETIC PEPTIDE: B Natriuretic Peptide: 62.6 pg/mL (ref 0.0–100.0)

## 2023-06-14 LAB — AMMONIA: Ammonia: 47 umol/L — ABNORMAL HIGH (ref 9–35)

## 2023-06-14 LAB — LIPASE, BLOOD: Lipase: 28 U/L (ref 11–51)

## 2023-06-14 LAB — PHOSPHORUS: Phosphorus: 3.6 mg/dL (ref 2.5–4.6)

## 2023-06-14 LAB — TROPONIN I (HIGH SENSITIVITY)
Troponin I (High Sensitivity): 2 ng/L (ref <18)
Troponin I (High Sensitivity): 2 ng/L (ref <18)

## 2023-06-14 LAB — FOLATE: Folate: 2.8 ng/mL — ABNORMAL LOW (ref >5.9)

## 2023-06-14 LAB — ETHANOL: Alcohol, Ethyl (B): <15 mg/dL (ref <15)

## 2023-06-14 MED ORDER — IOHEXOL 300 MG/ML  SOLN
100.0000 mL | Freq: Once | INTRAMUSCULAR | Status: AC | PRN
  Administered 2023-06-14: 100 mL via INTRAVENOUS

## 2023-06-14 MED ORDER — ACETAMINOPHEN 650 MG RE SUPP: 650.0000 mg | Freq: Four times a day (QID) | RECTAL | Status: DC | PRN

## 2023-06-14 MED ORDER — OXYCODONE HCL 5 MG PO TABS
5.0000 mg | ORAL_TABLET | Freq: Four times a day (QID) | ORAL | Status: DC | PRN
  Administered 2023-06-14: 5 mg via ORAL
  Filled 2023-06-14: qty 1

## 2023-06-14 MED ORDER — ALUM & MAG HYDROXIDE-SIMETH 200-200-20 MG/5ML PO SUSP: 15.0000 mL | Freq: Four times a day (QID) | ORAL | Status: DC | PRN

## 2023-06-14 MED ORDER — LACTATED RINGERS IV BOLUS
500.0000 mL | Freq: Once | INTRAVENOUS | Status: AC
  Administered 2023-06-14: 500 mL via INTRAVENOUS

## 2023-06-14 MED ORDER — ONDANSETRON HCL 4 MG/2ML IJ SOLN
4.0000 mg | Freq: Four times a day (QID) | INTRAMUSCULAR | Status: DC | PRN
  Administered 2023-06-15 – 2023-06-18 (×5): 4 mg via INTRAVENOUS
  Filled 2023-06-14 (×5): qty 2

## 2023-06-14 MED ORDER — SODIUM CHLORIDE 0.9 % IV SOLN: INTRAVENOUS | Status: AC

## 2023-06-14 MED ORDER — LACTATED RINGERS IV BOLUS
1000.0000 mL | Freq: Once | INTRAVENOUS | Status: AC
  Administered 2023-06-14: 1000 mL via INTRAVENOUS

## 2023-06-14 MED ORDER — ONDANSETRON HCL 4 MG/2ML IJ SOLN
4.0000 mg | Freq: Once | INTRAMUSCULAR | Status: AC
Start: 2023-06-14 — End: 2023-06-14
  Administered 2023-06-14: 4 mg via INTRAVENOUS
  Filled 2023-06-14: qty 2

## 2023-06-14 MED ORDER — LACTULOSE 10 GM/15ML PO SOLN
20.0000 g | Freq: Two times a day (BID) | ORAL | Status: DC
  Administered 2023-06-15 – 2023-06-18 (×6): 20 g via ORAL
  Filled 2023-06-14 (×6): qty 30

## 2023-06-14 MED ORDER — ONDANSETRON HCL 4 MG PO TABS
4.0000 mg | ORAL_TABLET | Freq: Four times a day (QID) | ORAL | Status: DC | PRN
  Administered 2023-06-18: 4 mg via ORAL
  Filled 2023-06-14: qty 1

## 2023-06-14 MED ORDER — ACETAMINOPHEN 325 MG PO TABS
650.0000 mg | ORAL_TABLET | Freq: Four times a day (QID) | ORAL | Status: DC | PRN
  Administered 2023-06-19: 650 mg via ORAL
  Filled 2023-06-14: qty 2

## 2023-06-14 MED ORDER — PANTOPRAZOLE SODIUM 40 MG IV SOLR
40.0000 mg | Freq: Once | INTRAVENOUS | Status: AC
  Administered 2023-06-14: 40 mg via INTRAVENOUS
  Filled 2023-06-14: qty 10

## 2023-06-14 MED ORDER — FENTANYL CITRATE PF 50 MCG/ML IJ SOSY
12.5000 ug | PREFILLED_SYRINGE | INTRAMUSCULAR | Status: DC | PRN
  Administered 2023-06-15: 50 ug via INTRAVENOUS
  Filled 2023-06-14: qty 1

## 2023-06-14 NOTE — ED Notes (Signed)
 Patient sister Melene Sportsman) is on phone stern voice demanding answers and questions. Explained to her that we are admitting patient for generalized weakness and multiple blood work has been done

## 2023-06-14 NOTE — ED Triage Notes (Signed)
 Pt BIB ems for weakness, chest tightness, and swelling in the mouth. Pt states she is experiencing chest pressure and tightness that comes and goes, along with tingling and numbness sensation in her fingers, nausea, vomiting, chills, constipation, since Monday.

## 2023-06-14 NOTE — H&P (Signed)
 History and Physical    Tanya Carroll ZOX:[id number] DOB: 13-Aug-1985 DOA: 06/14/2023  PCP: Patient, No Pcp Per   Patient coming from: Home   Chief Complaint: Chest pain, abdominal pain, N/V, hand numbness and pain, dry mouth   HPI: Tanya Carroll is a 38 y.o. female with medical history significant for alcohol abuse in remission, cirrhosis, and peripheral neuropathy, now presenting with multiple complaints including intermittent chest discomfort, abdominal pain, nausea, vomiting, numbness and tingling of the bilateral hands, and dry mouth with oral sores.   Patient has a long history of neuropathy attributed to alcoholism and vitamin deficiencies but this is worsened significantly in the past 2 weeks or so.  She has also been experiencing dry mouth, feels that her lip may be swollen, and has had some fissuring of the lower lip.  She then developed abdominal pain with nausea and vomiting yesterday morning.  Abdominal pain, nausea, and vomiting persisted today.  She has also experienced intermittent chest discomfort localized to the central chest and without any alleviating or exacerbating factors identified.  She denies urinary symptoms.  She no longer drinks alcohol.  ED Course: Upon arrival to the ED, patient is found to be afebrile and saturating well on room air with elevated heart rate and stable BP.  Labs are most notable for normal creatinine, bilirubin 3.1, normal WBC, hemoglobin 16.0, lactic acid 2.6, INR 1.5, normal BNP, and normal troponin x 2. CT reveals large amount of colonic stool, hepatomegaly, and fatty infiltration of the liver. There are no acute findings on CXR.   She was given 2 liters LR, IV Protonix , and Zofran  in the ED.   Review of Systems:  All other systems reviewed and apart from HPI, are negative.  Past Medical History:  Diagnosis Date   Cirrhosis of liver (HCC) 06/14/2023   Hypertension    Neuropathy    Placenta previa     History reviewed. No  pertinent surgical history.  Social History:   reports that she has never smoked. She has never used smokeless tobacco. She reports current alcohol use of about 2.0 standard drinks of alcohol per week. She reports that she does not use drugs.  Allergies  Allergen Reactions   Citrus     Family History  Problem Relation Age of Onset   Hypertension Mother    Hypertension Sister      Prior to Admission medications   Medication Sig Start Date End Date Taking? Authorizing Provider  alum & mag hydroxide-simeth (MAALOX MAX) 400-400-40 MG/5ML suspension Take 15 mLs by mouth every 8 (eight) hours as needed for indigestion. 05/21/23   Owen Blowers P, DO  amLODipine  (NORVASC ) 10 MG tablet Take 1 tablet (10 mg total) by mouth daily. 07/01/19   Marius Siemens, NP  dicyclomine  (BENTYL ) 20 MG tablet Take 1 tablet (20 mg total) by mouth 2 (two) times daily. 01/31/23   Smoot, Genevive Ket, PA-C  famotidine  (PEPCID ) 20 MG tablet Take 1 tablet (20 mg total) by mouth every 12 (twelve) hours as needed for heartburn or indigestion. 05/21/23   Gray, Alicia P, DO  furosemide (LASIX) 20 MG tablet Take 20 mg by mouth daily. 09/02/21   [provider]  losartan -hydrochlorothiazide  (HYZAAR) 100-25 MG tablet Take 1 tablet by mouth daily. 07/01/19   Marius Siemens, NP  omeprazole  (PRILOSEC) 20 MG capsule Take 1 capsule (20 mg total) by mouth daily for 14 days. 01/31/23 02/14/23  Smoot, Genevive Ket, PA-C  ondansetron  (ZOFRAN ) 4 MG tablet Take 1  tablet (4 mg total) by mouth every 6 (six) hours. 10/26/21   Dalene Duck, MD  Oxycodone  HCl 10 MG TABS Take 10 mg by mouth 4 (four) times daily. 08/08/21   [provider]  polyethylene glycol (MIRALAX ) 17 g packet Take 17 g by mouth daily. 05/07/23   Trish Furl, MD  potassium chloride  (KLOR-CON ) 10 MEQ tablet TAKE 1 TABLET(10 MEQ) BY MOUTH DAILY Patient taking differently: Take 10 mEq by mouth daily. 08/28/19   Marius Siemens, NP  Vitamin D, Ergocalciferol,  (DRISDOL) 1.25 MG (50000 UNIT) CAPS capsule Take 50,000 Units by mouth once a week. 05/14/21   [provider]    Physical Exam: Vitals:   06/14/23 1630 06/14/23 1700 06/14/23 1900 06/14/23 1950  BP: 116/86 120/78 99/67   Pulse: (!) 114 (!) 108 (!) 120   Resp: (!) 24 19 20    Temp:    98 F (36.7 C)  TempSrc:      SpO2: 97% 97% 100%   Weight:      Height:         Constitutional: NAD, no pallor or diaphoresis  Eyes: PERTLA, lids and conjunctivae normal ENMT: Mucous membranes are dry, lips cracked. Posterior pharynx clear of any exudate or lesions.   Neck: supple, no masses  Respiratory: no wheezing, no crackles. No accessory muscle use.  Cardiovascular: Rate ~120 and regular. Trace lower extremity edema.  Abdomen: Soft, no guarding. Bowel sounds active.  Musculoskeletal: no clubbing / cyanosis. No joint deformity upper and lower extremities.   Skin: no significant rashes, lesions, ulcers. Warm, dry, well-perfused. Neurologic: CN 2-12 grossly intact. Moving all extremities. Alert and oriented.  Psychiatric: Calm. Cooperative.    Labs and Imaging on Admission: I have personally reviewed following labs and imaging studies  CBC: Recent Labs  Lab 06/14/23 1255  WBC 5.7  NEUTROABS 3.2  HGB 16.0*  HCT 48.5*  MCV 115.5*  PLT 174   Basic Metabolic Panel: Recent Labs  Lab 06/14/23 1342  NA 135  K 3.7  CL 98  CO2 24  GLUCOSE 86  BUN 6  CREATININE 0.60  CALCIUM 8.6*  MG 1.8  PHOS 3.6   GFR: Estimated Creatinine Clearance: 94.4 mL/min (by C-G formula based on SCr of 0.6 mg/dL). Liver Function Tests: Recent Labs  Lab 06/14/23 1342  AST 50*  ALT 19  ALKPHOS 96  BILITOT 3.1*  PROT 7.2  ALBUMIN 3.2*   Recent Labs  Lab 06/14/23 1342  LIPASE 28   Recent Labs  Lab 06/14/23 1203  AMMONIA 47*   Coagulation Profile: Recent Labs  Lab 06/14/23 1342  INR 1.5*   Cardiac Enzymes: No results for input(s): "CKTOTAL", "CKMB", "CKMBINDEX", "TROPONINI" in  the last 168 hours. BNP (last 3 results) No results for input(s): "PROBNP" in the last 8760 hours. HbA1C: No results for input(s): "HGBA1C" in the last 72 hours. CBG: No results for input(s): "GLUCAP" in the last 168 hours. Lipid Profile: No results for input(s): "CHOL", "HDL", "LDLCALC", "TRIG", "CHOLHDL", "LDLDIRECT" in the last 72 hours. Thyroid  Function Tests: No results for input(s): "TSH", "T4TOTAL", "FREET4", "T3FREE", "THYROIDAB" in the last 72 hours. Anemia Panel: No results for input(s): "VITAMINB12", "FOLATE", "FERRITIN", "TIBC", "IRON", "RETICCTPCT" in the last 72 hours. Urine analysis:    Component Value Date/Time   COLORURINE AMBER (A) 06/14/2023 1843   APPEARANCEUR HAZY (A) 06/14/2023 1843   LABSPEC 1.045 (H) 06/14/2023 1843   PHURINE 5.0 06/14/2023 1843   GLUCOSEU NEGATIVE 06/14/2023 1843  HGBUR NEGATIVE 06/14/2023 1843   BILIRUBINUR SMALL (A) 06/14/2023 1843   BILIRUBINUR large (A) 11/29/2019 1636   KETONESUR 5 (A) 06/14/2023 1843   PROTEINUR NEGATIVE 06/14/2023 1843   UROBILINOGEN >=8.0 (A) 11/29/2019 1636   UROBILINOGEN 0.2 04/22/2007 1217   NITRITE NEGATIVE 06/14/2023 1843   LEUKOCYTESUR NEGATIVE 06/14/2023 1843   Sepsis Labs: @LABRCNTIP (procalcitonin:4,lacticidven:4) )No results found for this or any previous visit (from the past 240 hours).   Radiological Exams on Admission: CT ABDOMEN PELVIS W CONTRAST Result Date: 06/14/2023 CLINICAL DATA:  Vomiting, evaluate for bowel obstruction. EXAM: CT ABDOMEN AND PELVIS WITH CONTRAST TECHNIQUE: Multidetector CT imaging of the abdomen and pelvis was performed using the standard protocol following bolus administration of intravenous contrast. RADIATION DOSE REDUCTION: This exam was performed according to the departmental dose-optimization program which includes automated exposure control, adjustment of the mA and/or kV according to patient size and/or use of iterative reconstruction technique. CONTRAST:   OMNIPAQUE  IOHEXOL  300 MG/ML  SOLN COMPARISON:  CT abdomen and pelvis 05/07/2023. FINDINGS: Lower chest: No acute abnormality. The liver is enlarged. There is diffuse fatty infiltration, unchanged. Gallbladder and bile ducts are within normal limits. Hepatobiliary: No focal liver abnormality is seen. No gallstones, gallbladder wall thickening, or biliary dilatation. Pancreas: Unremarkable. No pancreatic ductal dilatation or surrounding inflammatory changes. Spleen: There is a small amount of air in the bladder. The bladder is otherwise within normal limits. 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 Otherwise, the kidneys and adrenal glands are within normal limits. Adrenals/Urinary Tract: Adrenal glands are unremarkable. Kidneys are normal, without renal calculi, focal lesion, or hydronephrosis. Bladder is unremarkable. Stomach/Bowel: Stomach is within normal limits. Appendix appears normal. No evidence of bowel wall thickening, distention, or inflammatory changes. There is a large amount of stool in the mid and proximal colon. Vascular/Lymphatic: No significant vascular findings are present. No enlarged abdominal or pelvic lymph nodes. Reproductive: There is a 3 cm cyst in the right ovary. The left ovary and uterus are within normal limits. Other: No abdominal wall hernia or abnormality. No abdominopelvic ascites. Musculoskeletal: No acute or significant osseous findings. IMPRESSION: 1. Small amount of air in the bladder, possibly related to recent instrumentation. Correlate clinically for infection. 2. Large amount of stool in the mid and proximal colon. 3. 3 cm right ovarian simple-appearing cyst. No follow-up imaging recommended. Note: This recommendation does not apply to premenarchal patients and to those with increased risk (genetic, family history, elevated tumor markers or other high-risk factors) of ovarian cancer. Reference: JACR 2020 Feb;  17(2):248-254 4. Hepatomegaly with fatty infiltration of the liver. Electronically Signed   By: Tyron Gallon M.D.   On: 06/14/2023 17:56   DG Chest 2 View Result Date: 06/14/2023 CLINICAL DATA:  Shortness of breath. EXAM: CHEST - 2 VIEW COMPARISON:  May 07, 2023. FINDINGS: The heart size and mediastinal contours are within normal limits. Both lungs are clear. The visualized skeletal structures are unremarkable. IMPRESSION: No active cardiopulmonary disease. Electronically Signed   By: Rosalene Colon M.D.   On: 06/14/2023 13:27    EKG: Independently reviewed. Sinus tachycardia, rate 104, PACs.   Assessment/Plan   1. Abdominal pain with N/V  - No acute findings on CT, exam is benign   - Suspected secondary to constipation  - Start lactulose, continue supportive care, she is tolerating clears and this can be advanced as tolerated   2. Peripheral neuropathy  - Patient complains of worsening  neuropathy and recent oral sores; she has hx of neuropathy attributed to prior alcoholism and nutrient deficiencies    - Check B-vitamins, TSH, and HIV  3. Cirrhosis  - MELD-Na is 18 on admission  - Hold diuretic in setting of recent anorexia and N/V   4. Lactic acidosis  - No evidence for infection, likely related to liver disease and hypovolemia  - Continue IVF hydration, trend lactate   5. Chest pain - Ruled-out for ACS in ED, seems to be GI-related  - Trial GI cocktail if recurs    DVT prophylaxis: SCDs Code Status: Full  Level of Care: Level of care: Med-Surg Family Communication: Sister and mother updated in ED  Disposition Plan:  Patient is from: home  Anticipated d/c is to: Home  Anticipated d/c date is: 5/18 or 06/16/23  Patient currently: Pending additional lab workup, tolerance of adequate oral intake, clinical stability Consults called: None  Admission status: Observation     Walton Guppy, MD [facility]  06/14/2023, 7:52 PM

## 2023-06-14 NOTE — ED Provider Notes (Signed)
 3:30 PM Patient transferred to me.    4:36 PM She remains tachycardic after her first 500 cc IV fluid bolus.  She has multiple vague complaints but does endorse vomiting within the last 24 hours.  Last night it was nonbloody but today there has been blood mixed in with her emesis.  She also has some right sided abdominal pain that has been going on today. Will give some more gentle fluids, IV protonix , and get a CT.  7:06 PM Patient's HR remains elevated. Will give a 2nd liter of fluids. CT shows air in bladder, but UA is unremarkable and she has no urinary symptoms. No current vomiting. CT otherwise unremarkable. Has been feeling dyspneic for a month, and has been seen in the ED for the same. She will need admission for continued tachycardia. Will discuss with hospitalist.   7:24 PM Dr. Brice Campi will admit.    Jerilynn Montenegro, MD 06/14/23 [phone]

## 2023-06-14 NOTE — ED Provider Notes (Signed)
 [place] EMERGENCY DEPARTMENT AT [facility] Provider Note   CSN: [id number] Arrival date & time: 06/14/23  1130     History  Chief Complaint  Patient presents with   Oral Swelling   Weakness   Chest Pain    Tanya Carroll is a 38 y.o. female.  HPI Patient reports history of cirrhosis of the liver.  By review of EMR there is verbal report of a biopsy and compensated cirrhotic liver with history of alcohol use now quit.  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  Patient had an admission 3\26\24 ultimately diagnosed with SIRS and resolved AKI and hyperphosphatemia with hypomagnesemia.  The patient has multiple complaints.  She reports she has pain both in her hands and her feet.  She reports she does have a history of neuropathy.  She has both pins-and-needles tingling quality and sharp pains that migrate about in the extremities.  She reports she started vomiting this morning and vomited several times.  She feels like her abdomen is uncomfortable and swollen.  Patient reports that she also feels like her mouth is dry and her lower lip is swollen and cracked.  She has also noted increasing shortness of breath and chest discomfort.  Patient denies any documented fever.  Patient denies fever.    Home Medications Prior to Admission medications   Medication Sig Start Date End Date Taking? Authorizing Provider  alum & mag hydroxide-simeth (MAALOX MAX) 400-400-40 MG/5ML suspension Take 15 mLs by mouth every 8 (eight) hours as needed for indigestion. 05/21/23   Owen Blowers P, DO  amLODipine  (NORVASC ) 10 MG tablet Take 1 tablet (10 mg total) by mouth daily. 07/01/19   Marius Siemens, NP  dicyclomine  (BENTYL ) 20 MG tablet Take 1 tablet (20 mg total) by mouth 2 (two) times daily. 01/31/23   Smoot, Genevive Ket, PA-C  famotidine  (PEPCID ) 20 MG tablet Take 1 tablet (20 mg total) by mouth every 12 (twelve) hours as needed for  heartburn or indigestion. 05/21/23   Gray, Alicia P, DO  furosemide (LASIX) 20 MG tablet Take 20 mg by mouth daily. 09/02/21   [provider]  losartan -hydrochlorothiazide  (HYZAAR) 100-25 MG tablet Take 1 tablet by mouth daily. 07/01/19   Marius Siemens, NP  omeprazole  (PRILOSEC) 20 MG capsule Take 1 capsule (20 mg total) by mouth daily for 14 days. 01/31/23 02/14/23  Smoot, Genevive Ket, PA-C  ondansetron  (ZOFRAN ) 4 MG tablet Take 1 tablet (4 mg total) by mouth every 6 (six) hours. 10/26/21   Dalene Duck, MD  Oxycodone  HCl 10 MG TABS Take 10 mg by mouth 4 (four) times daily. 08/08/21   [provider]  polyethylene glycol (MIRALAX ) 17 g packet Take 17 g by mouth daily. 05/07/23   Trish Furl, MD  potassium chloride  (KLOR-CON ) 10 MEQ tablet TAKE 1 TABLET(10 MEQ) BY MOUTH DAILY Patient taking differently: Take 10 mEq by mouth daily. 08/28/19   Marius Siemens, NP  Vitamin D, Ergocalciferol, (DRISDOL) 1.25 MG (50000 UNIT) CAPS capsule Take 50,000 Units by mouth once a week. 05/14/21   [provider]      Allergies    Citrus    Review of Systems   Review of Systems  Physical Exam Updated Vital Signs BP 120/88   Pulse (!) 109   Temp 97.9 F (36.6 C) (Oral)   Resp 18   Ht 5\' 4"  (1.626 m)   Wt 74.8 kg  SpO2 96%   BMI 28.32 kg/m  Physical Exam Constitutional:      Comments: Alert and nontoxic with clear mental status.  No respiratory distress.  HENT:     Head: Normocephalic and atraumatic.     Mouth/Throat:     Comments: Mouth is slightly dry.  No lesions in the mouth.  The patient's lower lip is mildly swollen with dry cracked skin.  No focal lesions. Eyes:     Extraocular Movements: Extraocular movements intact.     Pupils: Pupils are equal, round, and reactive to light.  Cardiovascular:     Rate and Rhythm: Regular rhythm. Tachycardia present.  Pulmonary:     Effort: Pulmonary effort is normal.     Breath sounds: Normal breath sounds.  Abdominal:      Comments: Abdomen mildly distended but not taut or tense.  Soft without guarding.  To deeper palpation, patient endorses discomfort diffusely.  Musculoskeletal:     Cervical back: Neck supple.     Comments: 1+ edema bilateral lower extremities.  No active wounds on the feet or lower extremities.  Patient diffusely endorses discomfort from neuropathy.  Skin:    General: Skin is warm and dry.  Neurological:     General: No focal deficit present.     Mental Status: She is oriented to person, place, and time.     Coordination: Coordination normal.  Psychiatric:        Mood and Affect: Mood normal.     ED Results / Procedures / Treatments   Labs (all labs ordered are listed, but only abnormal results are displayed) Labs Reviewed  AMMONIA - Abnormal; Notable for the following components:      Result Value   Ammonia 47 (*)    All other components within normal limits  CBC WITH DIFFERENTIAL/PLATELET - Abnormal; Notable for the following components:   Hemoglobin 16.0 (*)    HCT 48.5 (*)    MCV 115.5 (*)    MCH 38.1 (*)    All other components within normal limits  PROTIME-INR - Abnormal; Notable for the following components:   Prothrombin Time 18.2 (*)    INR 1.5 (*)    All other components within normal limits  COMPREHENSIVE METABOLIC PANEL WITH GFR - Abnormal; Notable for the following components:   Calcium 8.6 (*)    Albumin 3.2 (*)    AST 50 (*)    Total Bilirubin 3.1 (*)    All other components within normal limits  I-STAT CG4 LACTIC ACID, ED - Abnormal; Notable for the following components:   Lactic Acid, Venous 2.6 (*)    All other components within normal limits  ETHANOL  HCG, SERUM, QUALITATIVE  LIPASE, BLOOD  MAGNESIUM   PHOSPHORUS  CBC WITH DIFFERENTIAL/PLATELET  URINALYSIS, ROUTINE W REFLEX MICROSCOPIC  RAPID URINE DRUG SCREEN, HOSP PERFORMED  VITAMIN B1  BRAIN NATRIURETIC PEPTIDE  VITAMIN B6  I-STAT CG4 LACTIC ACID, ED  TROPONIN I (HIGH SENSITIVITY)   TROPONIN I (HIGH SENSITIVITY)    EKG EKG Interpretation Date/Time:  Saturday Jun 14 2023 11:51:47 EDT Ventricular Rate:  104 PR Interval:  162 QRS Duration:  110 QT Interval:  337 QTC Calculation: 444 R Axis:   40  Text Interpretation: Sinus tachycardia occasional PAC no significant chNGE FROM PREVIOUS Confirmed by Wynetta Heckle [id number]) on 06/14/2023 12:15:13 PM  Radiology DG Chest 2 View Result Date: 06/14/2023 CLINICAL DATA:  Shortness of breath. EXAM: CHEST - 2 VIEW COMPARISON:  May 07, 2023. FINDINGS: The heart  size and mediastinal contours are within normal limits. Both lungs are clear. The visualized skeletal structures are unremarkable. IMPRESSION: No active cardiopulmonary disease. Electronically Signed   By: Rosalene Colon M.D.   On: 06/14/2023 13:27    Procedures Procedures    Medications Ordered in ED Medications  lactated ringers bolus 500 mL (has no administration in time range)    ED Course/ Medical Decision Making/ A&P                                 Medical Decision Making Amount and/or Complexity of Data Reviewed Labs: ordered. Radiology: ordered.  Patient presents as outlined.  She has a history of compensated cirrhotic liver disease from prior alcohol use.  She does have multiple diffuse complaints of paresthesias and pain in the extremities as well as several episodes of vomiting.  Will proceed with broad diagnostic evaluation including lab work and chest x-ray.  GFR normal greater than 60 BUN 6 creatinine 0.6 potassium normal 3.7 mild elevation AST at 50 total bili 3.1 troponin 2 INR 1.5 white count 5.7 H&H is 16 and 48 normal differential pregnancy test negative ammonia 47 less than 15 lactic acid 2.6.  At this time we will administer like rehydration.  Patient shows signs of hemoconcentration with hemoglobin at 16 lactic is mildly elevated but no clear sign of infection.  Still pending urinalysis.  Will plan to obtain urine and repeat lactic after  a 500 cc fluid bolus.  At this time no signs of worsening cirrhosis or hepatic decompensation, no AKI.  Will need urinalysis and recheck on lactic.  If lactic improved or stable anticipate patient will be appropriate for discharge.  Antibiotics if urinalysis positive.  Dr. Aldean Amass to review results and final disposition.         Final Clinical Impression(s) / ED Diagnoses Final diagnoses:  Generalized pain  Paresthesias  Generalized weakness  Chronic liver disease and cirrhosis (HCC)    Rx / DC Orders ED Discharge Orders     None         Wynetta Heckle, MD 06/14/23 1526

## 2023-06-14 NOTE — ED Notes (Signed)
 Patient has adoptive mother at bedside and biological sister at bedside. There are several family dynamics at this time. Sent them out after sister started yelling at adoptive mother. Asked that brother come back along. Decision was made that there be one visitor at a time limit 30 minutes.

## 2023-06-14 NOTE — ED Notes (Signed)
 Pt doesn't feel up to getting up to give a urine specimen

## 2023-06-15 ENCOUNTER — Observation Stay (HOSPITAL_COMMUNITY)

## 2023-06-15 ENCOUNTER — Other Ambulatory Visit: Payer: Self-pay

## 2023-06-15 DIAGNOSIS — K5900 Constipation, unspecified: Secondary | ICD-10-CM | POA: Diagnosis not present

## 2023-06-15 DIAGNOSIS — E861 Hypovolemia: Secondary | ICD-10-CM | POA: Diagnosis present

## 2023-06-15 DIAGNOSIS — E872 Acidosis, unspecified: Secondary | ICD-10-CM | POA: Diagnosis present

## 2023-06-15 DIAGNOSIS — E538 Deficiency of other specified B group vitamins: Secondary | ICD-10-CM | POA: Diagnosis present

## 2023-06-15 DIAGNOSIS — D45 Polycythemia vera: Secondary | ICD-10-CM

## 2023-06-15 DIAGNOSIS — R079 Chest pain, unspecified: Secondary | ICD-10-CM | POA: Diagnosis present

## 2023-06-15 DIAGNOSIS — E8729 Other acidosis: Secondary | ICD-10-CM

## 2023-06-15 DIAGNOSIS — D696 Thrombocytopenia, unspecified: Secondary | ICD-10-CM | POA: Diagnosis not present

## 2023-06-15 DIAGNOSIS — R1110 Vomiting, unspecified: Secondary | ICD-10-CM | POA: Diagnosis not present

## 2023-06-15 DIAGNOSIS — K920 Hematemesis: Secondary | ICD-10-CM | POA: Diagnosis present

## 2023-06-15 DIAGNOSIS — Z8249 Family history of ischemic heart disease and other diseases of the circulatory system: Secondary | ICD-10-CM | POA: Diagnosis not present

## 2023-06-15 DIAGNOSIS — I959 Hypotension, unspecified: Secondary | ICD-10-CM | POA: Diagnosis not present

## 2023-06-15 DIAGNOSIS — D751 Secondary polycythemia: Secondary | ICD-10-CM | POA: Diagnosis present

## 2023-06-15 DIAGNOSIS — R112 Nausea with vomiting, unspecified: Secondary | ICD-10-CM

## 2023-06-15 DIAGNOSIS — K7460 Unspecified cirrhosis of liver: Secondary | ICD-10-CM | POA: Diagnosis present

## 2023-06-15 DIAGNOSIS — Z87892 Personal history of anaphylaxis: Secondary | ICD-10-CM | POA: Diagnosis not present

## 2023-06-15 DIAGNOSIS — R109 Unspecified abdominal pain: Secondary | ICD-10-CM | POA: Diagnosis not present

## 2023-06-15 DIAGNOSIS — E860 Dehydration: Secondary | ICD-10-CM | POA: Diagnosis present

## 2023-06-15 DIAGNOSIS — Z79899 Other long term (current) drug therapy: Secondary | ICD-10-CM | POA: Diagnosis not present

## 2023-06-15 DIAGNOSIS — Z881 Allergy status to other antibiotic agents status: Secondary | ICD-10-CM | POA: Diagnosis not present

## 2023-06-15 DIAGNOSIS — G629 Polyneuropathy, unspecified: Secondary | ICD-10-CM | POA: Diagnosis not present

## 2023-06-15 DIAGNOSIS — K30 Functional dyspepsia: Secondary | ICD-10-CM | POA: Diagnosis present

## 2023-06-15 DIAGNOSIS — K5909 Other constipation: Secondary | ICD-10-CM | POA: Diagnosis present

## 2023-06-15 DIAGNOSIS — R000 Tachycardia, unspecified: Secondary | ICD-10-CM | POA: Diagnosis present

## 2023-06-15 DIAGNOSIS — D649 Anemia, unspecified: Secondary | ICD-10-CM | POA: Diagnosis not present

## 2023-06-15 DIAGNOSIS — G621 Alcoholic polyneuropathy: Secondary | ICD-10-CM | POA: Diagnosis present

## 2023-06-15 DIAGNOSIS — G8929 Other chronic pain: Secondary | ICD-10-CM | POA: Diagnosis present

## 2023-06-15 DIAGNOSIS — Z79891 Long term (current) use of opiate analgesic: Secondary | ICD-10-CM | POA: Diagnosis not present

## 2023-06-15 DIAGNOSIS — I10 Essential (primary) hypertension: Secondary | ICD-10-CM | POA: Diagnosis present

## 2023-06-15 DIAGNOSIS — F1011 Alcohol abuse, in remission: Secondary | ICD-10-CM | POA: Diagnosis present

## 2023-06-15 LAB — GLUCOSE, CAPILLARY
Glucose-Capillary: 42 mg/dL — CL (ref 70–99)
Glucose-Capillary: 97 mg/dL (ref 70–99)
Glucose-Capillary: 113 mg/dL — ABNORMAL HIGH (ref 70–99)
Glucose-Capillary: 127 mg/dL — ABNORMAL HIGH (ref 70–99)
Glucose-Capillary: 131 mg/dL — ABNORMAL HIGH (ref 70–99)

## 2023-06-15 LAB — COMPREHENSIVE METABOLIC PANEL WITH GFR
ALT: 15 U/L (ref 0–44)
AST: 58 U/L — ABNORMAL HIGH (ref 15–41)
Albumin: 3.9 g/dL (ref 3.5–5.0)
Alkaline Phosphatase: 72 U/L (ref 38–126)
Anion gap: 17 — ABNORMAL HIGH (ref 5–15)
BUN: 7 mg/dL (ref 6–20)
CO2: 16 mmol/L — ABNORMAL LOW (ref 22–32)
Calcium: 8 mg/dL — ABNORMAL LOW (ref 8.9–10.3)
Chloride: 105 mmol/L (ref 98–111)
Creatinine, Ser: 0.74 mg/dL (ref 0.44–1.00)
GFR, Estimated: >60 mL/min (ref >60)
Glucose, Bld: 49 mg/dL — ABNORMAL LOW (ref 70–99)
Potassium: 3.8 mmol/L (ref 3.5–5.1)
Sodium: 138 mmol/L (ref 135–145)
Total Bilirubin: 2.9 mg/dL — ABNORMAL HIGH (ref 0.0–1.2)
Total Protein: 6.9 g/dL (ref 6.5–8.1)

## 2023-06-15 LAB — CBC WITH DIFFERENTIAL/PLATELET
Abs Immature Granulocytes: 0.07 10*3/uL (ref 0.00–0.07)
Basophils Absolute: 0 10*3/uL (ref 0.0–0.1)
Basophils Relative: 0 %
Eosinophils Absolute: 0 10*3/uL (ref 0.0–0.5)
Eosinophils Relative: 0 %
HCT: 39.9 % (ref 36.0–46.0)
Hemoglobin: 12.6 g/dL (ref 12.0–15.0)
Immature Granulocytes: 1 %
Lymphocytes Relative: 20 %
Lymphs Abs: 2.1 10*3/uL (ref 0.7–4.0)
MCH: 38.7 pg — ABNORMAL HIGH (ref 26.0–34.0)
MCHC: 31.6 g/dL (ref 30.0–36.0)
MCV: 122.4 fL — ABNORMAL HIGH (ref 80.0–100.0)
Monocytes Absolute: 1.2 10*3/uL — ABNORMAL HIGH (ref 0.1–1.0)
Monocytes Relative: 12 %
Neutro Abs: 7.1 10*3/uL (ref 1.7–7.7)
Neutrophils Relative %: 67 %
Platelets: 223 10*3/uL (ref 150–400)
RBC: 3.26 MIL/uL — ABNORMAL LOW (ref 3.87–5.11)
RDW: 14.4 % (ref 11.5–15.5)
WBC: 10.5 10*3/uL (ref 4.0–10.5)
nRBC: 0.2 % (ref 0.0–0.2)

## 2023-06-15 LAB — LACTIC ACID, PLASMA
Lactic Acid, Venous: 4.2 mmol/L (ref 0.5–1.9)
Lactic Acid, Venous: 4.3 mmol/L (ref 0.5–1.9)
Lactic Acid, Venous: 7.9 mmol/L (ref 0.5–1.9)
Lactic Acid, Venous: 8 mmol/L (ref 0.5–1.9)

## 2023-06-15 LAB — MRSA NEXT GEN BY PCR, NASAL: MRSA by PCR Next Gen: NOT DETECTED

## 2023-06-15 LAB — PROTIME-INR
INR: 1.7 — ABNORMAL HIGH (ref 0.8–1.2)
Prothrombin Time: 19.8 s — ABNORMAL HIGH (ref 11.4–15.2)

## 2023-06-15 LAB — HIV ANTIBODY (ROUTINE TESTING W REFLEX): HIV Screen 4th Generation wRfx: NONREACTIVE

## 2023-06-15 LAB — TSH: TSH: 0.72 u[IU]/mL (ref 0.350–4.500)

## 2023-06-15 LAB — CORTISOL: Cortisol, Plasma: 24.8 ug/dL

## 2023-06-15 LAB — PROCALCITONIN: Procalcitonin: 0.1 ng/mL

## 2023-06-15 MED ORDER — MIDODRINE HCL 5 MG PO TABS
5.0000 mg | ORAL_TABLET | Freq: Once | ORAL | Status: AC
  Administered 2023-06-15: 5 mg via ORAL
  Filled 2023-06-15: qty 1

## 2023-06-15 MED ORDER — MIDODRINE HCL 5 MG PO TABS
10.0000 mg | ORAL_TABLET | Freq: Three times a day (TID) | ORAL | Status: DC
  Administered 2023-06-15 – 2023-06-16 (×2): 10 mg via ORAL
  Filled 2023-06-15 (×2): qty 2

## 2023-06-15 MED ORDER — MAGNESIUM SULFATE 2 GM/50ML IV SOLN
2.0000 g | Freq: Once | INTRAVENOUS | Status: AC
  Administered 2023-06-15: 2 g via INTRAVENOUS
  Filled 2023-06-15: qty 50

## 2023-06-15 MED ORDER — LACTATED RINGERS IV BOLUS
1000.0000 mL | Freq: Once | INTRAVENOUS | Status: AC
  Administered 2023-06-15: 1000 mL via INTRAVENOUS

## 2023-06-15 MED ORDER — SODIUM CHLORIDE 0.9 % IV SOLN
250.0000 mL | INTRAVENOUS | Status: AC
  Administered 2023-06-15: 250 mL via INTRAVENOUS

## 2023-06-15 MED ORDER — PIPERACILLIN-TAZOBACTAM 3.375 G IVPB
3.3750 g | Freq: Three times a day (TID) | INTRAVENOUS | Status: DC
  Administered 2023-06-15 – 2023-06-18 (×10): 3.375 g via INTRAVENOUS
  Filled 2023-06-15 (×10): qty 50

## 2023-06-15 MED ORDER — HEPARIN SODIUM (PORCINE) 5000 UNIT/ML IJ SOLN
5000.0000 [IU] | Freq: Three times a day (TID) | INTRAMUSCULAR | Status: DC
  Administered 2023-06-15 – 2023-06-19 (×12): 5000 [IU] via SUBCUTANEOUS
  Filled 2023-06-15 (×12): qty 1

## 2023-06-15 MED ORDER — SODIUM CHLORIDE 0.9% FLUSH
10.0000 mL | Freq: Two times a day (BID) | INTRAVENOUS | Status: DC
  Administered 2023-06-15 – 2023-06-19 (×9): 10 mL

## 2023-06-15 MED ORDER — SODIUM CHLORIDE 0.9 % IV SOLN: INTRAVENOUS | Status: DC

## 2023-06-15 MED ORDER — SODIUM CHLORIDE 0.9% FLUSH: 10.0000 mL | INTRAVENOUS | Status: DC | PRN

## 2023-06-15 MED ORDER — MIDODRINE HCL 5 MG PO TABS
5.0000 mg | ORAL_TABLET | ORAL | Status: AC
  Administered 2023-06-15: 5 mg via ORAL
  Filled 2023-06-15: qty 1

## 2023-06-15 MED ORDER — LACTATED RINGERS IV SOLN: INTRAVENOUS | Status: DC

## 2023-06-15 MED ORDER — BOOST / RESOURCE BREEZE PO LIQD CUSTOM
1.0000 | Freq: Three times a day (TID) | ORAL | Status: DC
  Administered 2023-06-15 – 2023-06-19 (×4): 1 via ORAL

## 2023-06-15 MED ORDER — NOREPINEPHRINE 4 MG/250ML-% IV SOLN
2.0000 ug/min | INTRAVENOUS | Status: DC
  Administered 2023-06-15: 2 ug/min via INTRAVENOUS
  Filled 2023-06-15 (×2): qty 250

## 2023-06-15 MED ORDER — CHLORHEXIDINE GLUCONATE CLOTH 2 % EX PADS
6.0000 | MEDICATED_PAD | Freq: Every day | CUTANEOUS | Status: DC
  Administered 2023-06-16 – 2023-06-19 (×2): 6 via TOPICAL

## 2023-06-15 MED ORDER — SODIUM CHLORIDE 0.9 % IV BOLUS
500.0000 mL | Freq: Once | INTRAVENOUS | Status: AC
  Administered 2023-06-15: 500 mL via INTRAVENOUS

## 2023-06-15 MED ORDER — SODIUM CHLORIDE 0.9 % IV BOLUS
1000.0000 mL | Freq: Once | INTRAVENOUS | Status: AC
  Administered 2023-06-15: 1000 mL via INTRAVENOUS

## 2023-06-15 MED ORDER — METRONIDAZOLE 500 MG/100ML IV SOLN
500.0000 mg | Freq: Two times a day (BID) | INTRAVENOUS | Status: DC
  Administered 2023-06-15: 500 mg via INTRAVENOUS
  Filled 2023-06-15: qty 100

## 2023-06-15 MED ORDER — SODIUM CHLORIDE 0.9 % IV SOLN: INTRAVENOUS | Status: AC | PRN

## 2023-06-15 MED ORDER — POTASSIUM CHLORIDE CRYS ER 20 MEQ PO TBCR
40.0000 meq | EXTENDED_RELEASE_TABLET | Freq: Once | ORAL | Status: AC
  Administered 2023-06-15: 40 meq via ORAL
  Filled 2023-06-15: qty 2

## 2023-06-15 MED ORDER — ALBUMIN HUMAN 25 % IV SOLN
50.0000 g | Freq: Once | INTRAVENOUS | Status: AC
  Administered 2023-06-15: 50 g via INTRAVENOUS
  Filled 2023-06-15 (×2): qty 200

## 2023-06-15 MED ORDER — CHLORHEXIDINE GLUCONATE CLOTH 2 % EX PADS: 6.0000 | MEDICATED_PAD | Freq: Every day | CUTANEOUS | Status: DC

## 2023-06-15 MED ORDER — DEXTROSE 50 % IV SOLN
25.0000 g | Freq: Once | INTRAVENOUS | Status: AC
  Filled 2023-06-15: qty 50

## 2023-06-15 MED ORDER — DEXTROSE 50 % IV SOLN
INTRAVENOUS | Status: AC
  Administered 2023-06-15: 50 mL
  Filled 2023-06-15: qty 50

## 2023-06-15 MED ORDER — DEXTROSE 10 % IV SOLN: INTRAVENOUS | Status: DC

## 2023-06-15 MED ORDER — LACTATED RINGERS IV BOLUS: 2000.0000 mL | Freq: Once | INTRAVENOUS | Status: DC

## 2023-06-15 NOTE — Plan of Care (Signed)

## 2023-06-15 NOTE — Progress Notes (Signed)
    Patient Name: Tanya Carroll           DOB: 01/02/1986  MRN: [id number]      Admission Date: 06/14/2023  Attending Provider: Walton Guppy, MD  Primary Diagnosis: Abdominal pain with vomiting   Level of care: Stepdown    CROSS COVER NOTE   Date of Service   06/15/2023   Tanya Carroll, 38 y.o. female, was admitted on 06/14/2023 for Abdominal pain with vomiting. N/V started on 5/16 and reports poor oral oral intake for the past 3 days.   HPI/Events of Note   Hypotension, 80/50s Lactic acidosis  Reports weakness. A/Ox4. Remains tachycardic and afebrile.    Not evidence of infection. CT abd without acute findings. No acute findings on CXR. UA negative. Afebrile, WBC wnl. Lactic acid uptrending, now 4.3. Likely elevated due to  volume deletion or liver disease. Will repeat LA, CBC.  Reports abd pain with N/V. Poor oral intake for several days.  Appears dehydrated. Received 2L fluid bolus in the ED.  Continue IV hydration.   No signs of bleeding. Hgb stable. No melena, hematochezia, or other bleeding reported.   Bedside Assessment:  Patient is awake, A/O x4, with no associated distress.  Endorses weakness.  Denies dizziness, blurry vision, chest pain, palpitations,   Respiratory: Bilaterally clear, no wheezing, no crackles. Normal effort. No accessory muscle use.  Cardiovascular: Regular rate and rhythm. Trace edema BLE. 2+ pedal pulses.  Abdomen: Abdomen is soft and nontender.  Positive bowel sounds in all quadrants.    Addendum: 6644- S/p bolus, BP 70/50-  pt remains A/O x4. BP has been checked multiple times in different sites.  Lactic acid 4.2, continue to trend Transfer to SDU Continue hydration, adding albumin and 1x dose midodrine if no improvement after bolus.  Given shock, adding blood cultures and 1x dose zosyn, flagyl    0600-  BP 70-80's, Still receiving albumin and 500 cc NS bolus.  Spoke with PCCM regarding pt. Recommended additional midodrine and  continuing fluid resuscitation.  Pt is tolerating fluids well without SOB. Will also add peripheral levophed. Goal is SBP 80-90's with MAP 65. Pt will be seen by PCCM this morning.    Interventions/ Plan   Fluid bolus, 2.5 L Continue maintenance fluid  IV albumin x1 Midodrine, 10 mg  total Chest xray 1x dose zosyn, flagyl  Labs: CMP, CBC, Lactic acid, procalcitonin, cortisol PCCM consult completed         Skylah Delauter, DNP, ACNPC- AG [facility] [place]

## 2023-06-15 NOTE — Consult Note (Signed)
 NAME:  Tanya Carroll, MRN:  [id number], DOB:  November 11, 1985, LOS: 0 ADMISSION DATE:  06/14/2023, CONSULTATION DATE:  06/15/23 REFERRING MD:  TRH, CHIEF COMPLAINT:  N/V   History of Present Illness:   38 y.o. woman with reported history of cirrhosis presents with few day history of chest discomfort tightness, nausea vomiting constipation simply transferred to the stepdown unit in ICU with progressive hypotension.  Initial vitals patient is hypertensive.  Normotensive throughout most of the stay in the emergency department.  Got some fluids.  Transfer to the floor in the evening.  Became progressively hypotensive overnight.  Additional IV fluid boluses given.  Transfer to stepdown.  Midodrine given.  Started on norepinephrine.  Flagyl  was given.  Antibiotics expanded to Zosyn.  Repeat labs including blood cultures ordered.  Has not obtained prior to antibiotic administration.  Lactic acid remained mildly elevated.  LFTs stable, T. bili slightly elevated from baseline.  Kidney function at baseline.  No leukocytosis, mild elevation in hemoglobin.  No thrombocytopenia.  She endorses feeling thirsty.  She endorses right upper quadrant pain with radiation to the back.  Reviewed chest x-ray this morning as well as in the ED that reveals clear lungs on my review and interpretation..  CT abdomen pelvis result shows no concerning findings, no ascites, no obvious source of infection or signs of enteritis or colitis per report.  Reviewed UA which shows no leukocyte esterase and no nitrite.  Notably she was in the hospital 05/07/2023, ED visit, diagnosed with UTI E. coli.  Reportedly got cephalosporin for that and had some numbness tingling, concern for allergic reaction.  Keflex  was prescribed at that visit.  Notably she had similar complaints at that visit.  Pertinent  Medical History  Reported cirrhosis  Significant Hospital Events: Including procedures, antibiotic start and stop dates in addition to other  pertinent events   5/17 admitted with nausea vomiting 5/18 transferred to stepdown with subsequent ICU level of care with progressive hypotension, peripheral vasopressor started  Interim History / Subjective:  Became hypotensive, fluid boluses given, midodrine started. NE started   Objective    Blood pressure (!) 85/38, pulse (!) 114, temperature 98.3 F (36.8 C), temperature source Oral, resp. rate 20, height 5\' 4"  (1.626 m), weight 76.2 kg, SpO2 98%.        Intake/Output Summary (Last 24 hours) at 06/15/2023 0804 Last data filed at 06/15/2023 9562 Gross per 24 hour  Intake 4844.06 ml  Output --  Net 4844.06 ml   Filed Weights   06/14/23 1156 06/14/23 2049 06/15/23 0051  Weight: 74.8 kg 76.2 kg 76.2 kg    Examination: General: Sitting up in chair in no acute distress HENT: Atraumatic normocephalic, scleral icterus noted Lungs: Normal work of breathing, on room air Cardiovascular: Tachycardic, no murmur noted Abdomen: Nondistended, mild tenderness to palpation right upper quadrant Extremities: No edema Neuro: Alert and interactive follows all commands with no focal deficits  Resolved problem list   Assessment and Plan   Hypotension: Suspect hypovolemic in the setting of poor p.o. intake and nausea and vomiting preceding hospitalization.  Given worsening through the first 2 hours of hospitalization, occult sepsis is considered.  No clear source of infection.  Chest x-ray clear, CT abdomen pelvis without source of infection, UA unremarkable. -- Status post aggressive IV fluid resuscitation, additional fluid boluses ordered -- Continue Zosyn for now -- Obtain blood cultures  Lactic acidosis: Likely in setting of hypotension, persistent in setting of reported cirrhosis and inability to clear lactate. --  Continue fluid resuscitation -- Blood pressure support as above  Polycythemia: Mild elevation in hemoglobin, suspect hemoconcentration, will repeat and assess  Abdominal  pain, nausea vomiting: CT scan is clear.  Suspect some type of viral gastroenteritis.  Mild elevation in AST overall improved or consistent with baseline, mild elevation in T. bili compared to baseline. -- Zosyn as above, however suspect viral process -- She has history of peripheral neuropathy I do wonder if she has developed gastroparesis given recurrent complaints of ED visit 05/07/2023  Cirrhosis:  -- Hold reported home Lasix given hypovolemia  Best Practice (right click and "Reselect all SmartList Selections" daily)   Diet/type: NPO DVT prophylaxis prophylactic heparin  Pressure ulcer(s): N/A GI prophylaxis: N/A Lines: N/A Foley:  N/A Code Status:  full code Last date of multidisciplinary goals of care discussion [n/a]  Labs   CBC: Recent Labs  Lab 06/14/23 1255  WBC 5.7  NEUTROABS 3.2  HGB 16.0*  HCT 48.5*  MCV 115.5*  PLT 174    Basic Metabolic Panel: Recent Labs  Lab 06/14/23 1342  NA 135  K 3.7  CL 98  CO2 24  GLUCOSE 86  BUN 6  CREATININE 0.60  CALCIUM 8.6*  MG 1.8  PHOS 3.6   GFR: Estimated Creatinine Clearance: 95.3 mL/min (by C-G formula based on SCr of 0.6 mg/dL). Recent Labs  Lab 06/14/23 1255 06/14/23 1352 06/14/23 1946 06/14/23 2340 06/15/23 0251  WBC 5.7  --   --   --   --   LATICACIDVEN  --  2.6* 4.3* 4.2* 4.3*    Liver Function Tests: Recent Labs  Lab 06/14/23 1342  AST 50*  ALT 19  ALKPHOS 96  BILITOT 3.1*  PROT 7.2  ALBUMIN 3.2*   Recent Labs  Lab 06/14/23 1342  LIPASE 28   Recent Labs  Lab 06/14/23 1203  AMMONIA 47*    ABG    Component Value Date/Time   TCO2 26 08/25/2015 1212     Coagulation Profile: Recent Labs  Lab 06/14/23 1342  INR 1.5*    Cardiac Enzymes: No results for input(s): "CKTOTAL", "CKMB", "CKMBINDEX", "TROPONINI" in the last 168 hours.  HbA1C: Hemoglobin A1C  Date/Time Value Ref Range Status  07/01/2019 09:41 AM 5.5 4.0 - 5.6 % Final   Hgb A1c MFr Bld  Date/Time Value Ref Range  Status  07/17/2020 10:04 AM 5.7 (H) 4.8 - 5.6 % Final    Comment:    (NOTE)         Prediabetes: 5.7 - 6.4         Diabetes: >6.4         Glycemic control for adults with diabetes: <7.0     CBG: No results for input(s): "GLUCAP" in the last 168 hours.  Review of Systems:   She denies chest pain.  She denies shortness of breath.  She does endorse right upper quadrant pain with radiation to the back.  Comprehensive review of systems otherwise negative.  Past Medical History:  She,  has a past medical history of Cirrhosis of liver (HCC) (06/14/2023), Hypertension, Neuropathy, and Placenta previa.   Surgical History:  History reviewed. No pertinent surgical history.   Social History:   reports that she has never smoked. She has never used smokeless tobacco. She reports current alcohol use of about 2.0 standard drinks of alcohol per week. She reports that she does not use drugs.   Family History:  Her family history includes Hypertension in her mother and sister.  Allergies Allergies  Allergen Reactions   Citrus Itching and Swelling   Keflex  [Cephalexin ] Anaphylaxis and Shortness Of Breath     Home Medications  Prior to Admission medications   Medication Sig Start Date End Date Taking? Authorizing Provider  alum & mag hydroxide-simeth (MAALOX MAX) 400-400-40 MG/5ML suspension Take 15 mLs by mouth every 8 (eight) hours as needed for indigestion. 05/21/23  Yes Owen Blowers P, DO  famotidine  (PEPCID ) 20 MG tablet Take 1 tablet (20 mg total) by mouth every 12 (twelve) hours as needed for heartburn or indigestion. 05/21/23  Yes Owen Blowers P, DO  furosemide (LASIX) 20 MG tablet Take 20 mg by mouth daily. 09/02/21  Yes [provider]  omeprazole  (PRILOSEC) 20 MG capsule Take 1 capsule (20 mg total) by mouth daily for 14 days. Patient taking differently: Take 20 mg by mouth daily as needed (heartburn). 01/31/23 06/13/24 Yes Smoot, Genevive Ket, PA-C  Oxycodone  HCl 10 MG TABS Take 10 mg  by mouth 4 (four) times daily. 08/08/21  Yes [provider]  polyethylene glycol (MIRALAX ) 17 g packet Take 17 g by mouth daily. 05/07/23  Yes Trish Furl, MD  spironolactone (ALDACTONE) 25 MG tablet Take 25 mg by mouth daily.   Yes [provider]  dicyclomine  (BENTYL ) 20 MG tablet Take 1 tablet (20 mg total) by mouth 2 (two) times daily. Patient not taking: Reported on 06/14/2023 01/31/23   Smoot, Genevive Ket, PA-C  potassium chloride  (KLOR-CON ) 10 MEQ tablet TAKE 1 TABLET(10 MEQ) BY MOUTH DAILY Patient not taking: Reported on 06/14/2023 08/28/19   Marius Siemens, NP     Critical care time:     CRITICAL CARE Performed by: Guerry Leek   Total critical care time: 40 minutes  Critical care time was exclusive of separately billable procedures and treating other patients.  Critical care was necessary to treat or prevent imminent or life-threatening deterioration.  Critical care was time spent personally by me on the following activities: development of treatment plan with patient and/or surrogate as well as nursing, discussions with consultants, evaluation of patient's response to treatment, examination of patient, obtaining history from patient or surrogate, ordering and performing treatments and interventions, ordering and review of laboratory studies, ordering and review of radiographic studies, pulse oximetry and re-evaluation of patient's condition.   Guerry Leek, MD See [place] Foley

## 2023-06-15 NOTE — Progress Notes (Signed)
 Peripherally Inserted Central Catheter Placement  The IV Nurse has discussed with the patient and/or persons authorized to consent for the patient, the purpose of this procedure and the potential benefits and risks involved with this procedure.  The benefits include less needle sticks, lab draws from the catheter, and the patient may be discharged home with the catheter. Risks include, but not limited to, infection, bleeding, blood clot (thrombus formation), and puncture of an artery; nerve damage and irregular heartbeat and possibility to perform a PICC exchange if needed/ordered by physician.  Alternatives to this procedure were also discussed.  Bard Power PICC patient education guide, fact sheet on infection prevention and patient information card has been provided to patient /or left at bedside.    PICC Placement Documentation  PICC Double Lumen 06/15/23 Right Brachial 38 cm 0 cm (Active)  Indication for Insertion or Continuance of Line Vasoactive infusions 06/15/23 0951  Exposed Catheter (cm) 0 cm 06/15/23 0951  Site Assessment Clean, Dry, Intact 06/15/23 0951  Lumen #1 Status Flushed;Saline locked;Blood return noted 06/15/23 0951  Lumen #2 Status Flushed;Saline locked;Blood return noted 06/15/23 0951  Dressing Type Transparent;Securing device 06/15/23 0951  Dressing Status Antimicrobial disc/dressing in place;Clean, Dry, Intact 06/15/23 0951  Line Care Connections checked and tightened 06/15/23 0951  Line Adjustment (NICU/IV Team Only) No 06/15/23 0951  Dressing Intervention New dressing;Adhesive placed at insertion site (IV team only);Adhesive placed around edges of dressing (IV team/ICU RN only) 06/15/23 0951  Dressing Change Due 06/22/23 06/15/23 0951       Allegra Arch 06/15/2023, 9:51 AM

## 2023-06-15 NOTE — TOC Progression Note (Signed)
 Transition of Care [facility]) - Progression Note    Patient Details  Name: Mariane Burpee MRN: [id number] Date of Birth: 1985/11/01  Transition of [facility] CM/SW Contact  Katrine Parody, LCSW Phone Number: 06/15/2023, 1:34 PM  Clinical Narrative:    CSW visited pt at bedside to discuss Moon, present was a sister Calista Catching and other related persons.  CSW asked pt if it was ok to discuss an Technical brewer with others present, she agreed verbally. Delivered MOON- pt wanted sister to sign. Sister Kizzy asked to visually review the document, so we scrolled through the sections that were discussed, she signed, CSW delivered hard copy to the room.      Barriers to Discharge: Continued Medical Work up  Expected Discharge Plan and Services                                               Social Determinants of Health (SDOH) Interventions SDOH Screenings   Food Insecurity: No Food Insecurity (06/14/2023)  Housing: Low Risk  (06/14/2023)  Transportation Needs: No Transportation Needs (06/14/2023)  Utilities: Not At Risk (06/14/2023)  Depression (PHQ2-9): Low Risk  (09/03/2021)  Social Connections: Unknown (06/12/2021)   Received from [facility]  Tobacco Use: Low Risk  (06/14/2023)    Readmission Risk Interventions     No data to display

## 2023-06-15 NOTE — Care Management Obs Status (Signed)
 MEDICARE OBSERVATION STATUS NOTIFICATION   Patient Details  Name: Marguerita Stapp MRN: [id number] Date of Birth: 03-18-1985   Medicare Observation Status Notification Given:  Yes    Jewelz Kobus Liane Redman, LCSW 06/15/2023, 12:14 PM

## 2023-06-16 ENCOUNTER — Telehealth: Payer: Self-pay | Admitting: Gastroenterology

## 2023-06-16 DIAGNOSIS — R1110 Vomiting, unspecified: Secondary | ICD-10-CM | POA: Diagnosis not present

## 2023-06-16 DIAGNOSIS — R109 Unspecified abdominal pain: Secondary | ICD-10-CM | POA: Diagnosis not present

## 2023-06-16 LAB — GLUCOSE, CAPILLARY
Glucose-Capillary: 109 mg/dL — ABNORMAL HIGH (ref 70–99)
Glucose-Capillary: 131 mg/dL — ABNORMAL HIGH (ref 70–99)
Glucose-Capillary: 141 mg/dL — ABNORMAL HIGH (ref 70–99)
Glucose-Capillary: 151 mg/dL — ABNORMAL HIGH (ref 70–99)
Glucose-Capillary: 169 mg/dL — ABNORMAL HIGH (ref 70–99)

## 2023-06-16 LAB — CBC
HCT: 35.3 % — ABNORMAL LOW (ref 36.0–46.0)
Hemoglobin: 11.8 g/dL — ABNORMAL LOW (ref 12.0–15.0)
MCH: 38.7 pg — ABNORMAL HIGH (ref 26.0–34.0)
MCHC: 33.4 g/dL (ref 30.0–36.0)
MCV: 115.7 fL — ABNORMAL HIGH (ref 80.0–100.0)
Platelets: 166 10*3/uL (ref 150–400)
RBC: 3.05 MIL/uL — ABNORMAL LOW (ref 3.87–5.11)
RDW: 14.2 % (ref 11.5–15.5)
WBC: 5.6 10*3/uL (ref 4.0–10.5)
nRBC: 0 % (ref 0.0–0.2)

## 2023-06-16 LAB — COMPREHENSIVE METABOLIC PANEL WITH GFR
ALT: 17 U/L (ref 0–44)
AST: 43 U/L — ABNORMAL HIGH (ref 15–41)
Albumin: 3.1 g/dL — ABNORMAL LOW (ref 3.5–5.0)
Alkaline Phosphatase: 66 U/L (ref 38–126)
Anion gap: 8 (ref 5–15)
BUN: 12 mg/dL (ref 6–20)
CO2: 24 mmol/L (ref 22–32)
Calcium: 8.1 mg/dL — ABNORMAL LOW (ref 8.9–10.3)
Chloride: 103 mmol/L (ref 98–111)
Creatinine, Ser: 0.8 mg/dL (ref 0.44–1.00)
GFR, Estimated: >60 mL/min (ref >60)
Glucose, Bld: 118 mg/dL — ABNORMAL HIGH (ref 70–99)
Potassium: 3.8 mmol/L (ref 3.5–5.1)
Sodium: 135 mmol/L (ref 135–145)
Total Bilirubin: 1.6 mg/dL — ABNORMAL HIGH (ref 0.0–1.2)
Total Protein: 5.8 g/dL — ABNORMAL LOW (ref 6.5–8.1)

## 2023-06-16 LAB — LACTIC ACID, PLASMA: Lactic Acid, Venous: 1.7 mmol/L (ref 0.5–1.9)

## 2023-06-16 MED ORDER — SPIRONOLACTONE 25 MG PO TABS
25.0000 mg | ORAL_TABLET | Freq: Every day | ORAL | Status: DC
  Administered 2023-06-16 – 2023-06-19 (×4): 25 mg via ORAL
  Filled 2023-06-16 (×4): qty 1

## 2023-06-16 MED ORDER — FUROSEMIDE 20 MG PO TABS
20.0000 mg | ORAL_TABLET | Freq: Every day | ORAL | Status: DC
  Administered 2023-06-16 – 2023-06-19 (×4): 20 mg via ORAL
  Filled 2023-06-16 (×4): qty 1

## 2023-06-16 MED ORDER — POLYETHYLENE GLYCOL 3350 17 G PO PACK
17.0000 g | PACK | Freq: Two times a day (BID) | ORAL | Status: DC
  Administered 2023-06-16 – 2023-06-18 (×5): 17 g via ORAL
  Filled 2023-06-16 (×6): qty 1

## 2023-06-16 MED ORDER — DICYCLOMINE HCL 20 MG PO TABS
20.0000 mg | ORAL_TABLET | Freq: Two times a day (BID) | ORAL | Status: DC
Start: 2023-06-16 — End: 2023-06-19
  Administered 2023-06-16 – 2023-06-19 (×7): 20 mg via ORAL
  Filled 2023-06-16 (×7): qty 1

## 2023-06-16 MED ORDER — PANTOPRAZOLE SODIUM 40 MG PO TBEC
40.0000 mg | DELAYED_RELEASE_TABLET | Freq: Every day | ORAL | Status: DC
  Administered 2023-06-16 – 2023-06-19 (×4): 40 mg via ORAL
  Filled 2023-06-16 (×4): qty 1

## 2023-06-16 MED ORDER — SENNOSIDES-DOCUSATE SODIUM 8.6-50 MG PO TABS
1.0000 | ORAL_TABLET | Freq: Two times a day (BID) | ORAL | Status: DC
  Administered 2023-06-16 – 2023-06-19 (×7): 1 via ORAL
  Filled 2023-06-16 (×7): qty 1

## 2023-06-16 NOTE — Progress Notes (Signed)
 PROGRESS NOTE    Tanya Carroll  ZOX:[id number] DOB: 08-22-1985 DOA: 06/14/2023 PCP: Patient, No Pcp Per   Brief Narrative:  Tanya Carroll is a 38 y.o. female with medical history significant for alcohol abuse in remission, cirrhosis, and peripheral neuropathy, now presenting with multiple complaints including intermittent chest discomfort, abdominal pain, nausea, vomiting, numbness and tingling of the bilateral hands, and dry mouth with oral sores.   Patient admitted as above with worsening abdominal pain, nausea, vomiting with severe hypotension, initially concern for hypovolemic shock.  Etiology thought to be chronic constipation.  With fluid resuscitation patient's symptoms improved drastically, now tolerating p.o.   Assessment & Plan:   Principal Problem:   Abdominal pain with vomiting Active Problems:   Cirrhosis of liver (HCC)   Constipation   Neuropathy   Chest pain  Intractable abdominal pain with N/V  Severe hypotension, shock ruled out Lactic acidosis Severe chronic constipation Rule out cannabinoid hyperemesis syndrome - No acute findings on CT, exam is benign  - thought to be secondary to constipation (patient reports 1 bowel movement every 2 to 3 weeks= her based, now worse on narcotics) - Continue lactulose , MiraLAX , Bentyl , Senokot - Advance diet as tolerated, currently on clears -advance to full liquid diet - Noted cannabis use likely complicating above - Patient has follow-up scheduled with [facility] on 06/18/2023 at 1:40 PM - Likely non-infectious  Peripheral neuropathy, chronic - Patient complains of worsening neuropathy and recent oral sores; she has hx of neuropathy attributed to prior alcoholism and nutrient deficiencies    - Check B-vitamins, pending -TSH, and HIV - unremarkable   Cirrhosis, chronic - MELD-Na is 18 on admission  - Resume diuretics at DC  Polycythemia - Likely hemoconcentration, improving with fluids  Atypical chest/epigastric  pain - Ruled-out for ACS in ED, seems to be GI-related     DVT prophylaxis: heparin  injection 5,000 Units Start: 06/15/23 1400 SCDs Start: 06/14/23 1927   Code Status:   Code Status: Full Code  Family Communication: Mother at bedside  Status is: Inpatient  Dispo: The patient is from: Home              Anticipated d/c is to: Home              Anticipated d/c date is: 24 to 48 hours              Patient currently not medically stable for discharge  Consultants:  PCCM  Procedures:  None  Antimicrobials:  None  Subjective: No acute issues or events overnight, patient symptoms improved drastically with resuscitation.  Looking forward to initiating diet today, otherwise denies follow diarrhea headache fevers chills shortness of breath or chest pain.  Nausea and abdominal pain improving but not yet resolved  Objective: Vitals:   06/16/23 0400 06/16/23 0500 06/16/23 0510 06/16/23 0600  BP: 121/82 127/84  128/83  Pulse: 88 88  88  Resp: 17 (!) 22  16  Temp:   98 F (36.7 C)   TempSrc:   Oral   SpO2: 97% 96%  96%  Weight:      Height:        Intake/Output Summary (Last 24 hours) at 06/16/2023 0711 Last data filed at 06/16/2023 0600 Gross per 24 hour  Intake 1657.6 ml  Output --  Net 1657.6 ml   Filed Weights   06/14/23 1156 06/14/23 2049 06/15/23 0051  Weight: 74.8 kg 76.2 kg 76.2 kg    Examination:  General:  Pleasantly resting in  bed, No acute distress. HEENT:  Normocephalic atraumatic.  Sclerae nonicteric, noninjected.  Extraocular movements intact bilaterally. Neck:  Without mass or deformity.  Trachea is midline. Lungs:  Clear to auscultate bilaterally without rhonchi, wheeze, or rales. Heart:  Regular rate and rhythm.  Without murmurs, rubs, or gallops. Abdomen:  Soft, nontender, nondistended.  Without guarding or rebound. Extremities: Without cyanosis, clubbing, edema, or obvious deformity. Skin:  Warm and dry, no erythema.  Data Reviewed: I have  personally reviewed following labs and imaging studies  CBC: Recent Labs  Lab 06/14/23 1255 06/15/23 0812 06/16/23 0529  WBC 5.7 10.5 5.6  NEUTROABS 3.2 7.1  --   HGB 16.0* 12.6 11.8*  HCT 48.5* 39.9 35.3*  MCV 115.5* 122.4* 115.7*  PLT 174 223 166   Basic Metabolic Panel: Recent Labs  Lab 06/14/23 1342 06/15/23 0812 06/16/23 0529  NA 135 138 135  K 3.7 3.8 3.8  CL 98 105 103  CO2 24 16* 24  GLUCOSE 86 49* 118*  BUN 6 7 12   CREATININE 0.60 0.74 0.80  CALCIUM 8.6* 8.0* 8.1*  MG 1.8  --   --   PHOS 3.6  --   --    GFR: Estimated Creatinine Clearance: 95.3 mL/min (by C-G formula based on SCr of 0.8 mg/dL). Liver Function Tests: Recent Labs  Lab 06/14/23 1342 06/15/23 0812 06/16/23 0529  AST 50* 58* 43*  ALT 19 15 17   ALKPHOS 96 72 66  BILITOT 3.1* 2.9* 1.6*  PROT 7.2 6.9 5.8*  ALBUMIN  3.2* 3.9 3.1*   Recent Labs  Lab 06/14/23 1342  LIPASE 28   Recent Labs  Lab 06/14/23 1203  AMMONIA 47*   Coagulation Profile: Recent Labs  Lab 06/14/23 1342 06/15/23 0812  INR 1.5* 1.7*   CBG: Recent Labs  Lab 06/15/23 1202 06/15/23 1628 06/15/23 2056 06/16/23 0016 06/16/23 0523  GLUCAP 113* 127* 131* 131* 109*   Thyroid  Function Tests: Recent Labs    06/15/23 0812  TSH 0.720   Anemia Panel: Recent Labs    06/14/23 2106  FOLATE 2.8*   Sepsis Labs: Recent Labs  Lab 06/14/23 2340 06/15/23 0251 06/15/23 0812 06/15/23 1008  PROCALCITON  --   --  0.10  --   LATICACIDVEN 4.2* 4.3* 7.9* 8.0*    Recent Results (from the past 240 hours)  MRSA Next Gen by PCR, Nasal     Status: None   Collection Time: 06/15/23  4:29 AM   Specimen: Nasal Mucosa; Nasal Swab  Result Value Ref Range Status   MRSA by PCR Next Gen NOT DETECTED NOT DETECTED Final    Comment: (NOTE) The GeneXpert MRSA Assay (FDA approved for NASAL specimens only), is one component of a comprehensive MRSA colonization surveillance program. It is not intended to diagnose MRSA infection  nor to guide or monitor treatment for MRSA infections. Test performance is not FDA approved in patients less than 32 years old. Performed at [facility], [address] [place], [place] [address]   Culture, blood (x 2)     Status: None (Preliminary result)   Collection Time: 06/15/23  7:55 AM   Specimen: Left Antecubital; Blood  Result Value Ref Range Status   Specimen Description   Final    LEFT ANTECUBITAL BOTTLES DRAWN AEROBIC ONLY Performed at [facility], [address], [place], [place] [address]    Special Requests   Final    Blood Culture results may not be optimal due to an inadequate  volume of blood received in culture bottles Performed at [facility], [address] [place], [place] [address]    Culture   Final    NO GROWTH < 24 HOURS Performed at [facility], [address], [place], [place] [address]    Report Status PENDING  Incomplete  Culture, blood (x 2)     Status: None (Preliminary result)   Collection Time: 06/15/23  8:12 AM   Specimen: BLOOD LEFT HAND  Result Value Ref Range Status   Specimen Description   Final    BLOOD LEFT HAND BOTTLES DRAWN AEROBIC ONLY Performed at [facility], [address] [place], [place] [address]    Special Requests   Final    Blood Culture results may not be optimal due to an inadequate volume of blood received in culture bottles Performed at [facility], [address] [place], [place] [address]    Culture   Final    NO GROWTH < 24 HOURS Performed at [facility], [address] [place], [place] [address]    Report Status PENDING  Incomplete         Radiology Studies: US  EKG SITE RITE Result Date: 06/15/2023 If Site Rite image not attached, placement could not be confirmed due to current cardiac rhythm.  DG Chest Port 1 View Result Date: 06/15/2023 CLINICAL DATA:  [id number].  Sepsis and chest pain.  EXAM: PORTABLE CHEST 1 VIEW COMPARISON:  AP Lat chest yesterday at 1:22 p.m. FINDINGS: 5:16 p.m. Lungs are expiratory but appear generally clear with limited view of the bases. Heart size and vasculature, and the mediastinal configuration appear normal. No acute osseous findings. Multiple overlying monitor wires. IMPRESSION: Expiratory chest with limited view of the bases. No evidence of acute chest disease. Electronically Signed   By: Denman Fischer M.D.   On: 06/15/2023 06:23   CT ABDOMEN PELVIS W CONTRAST Result Date: 06/14/2023 CLINICAL DATA:  Vomiting, evaluate for bowel obstruction. EXAM: CT ABDOMEN AND PELVIS WITH CONTRAST TECHNIQUE: Multidetector CT imaging of the abdomen and pelvis was performed using the standard protocol following bolus administration of intravenous contrast. RADIATION DOSE REDUCTION: This exam was performed according to the departmental dose-optimization program which includes automated exposure control, adjustment of the mA and/or kV according to patient size and/or use of iterative reconstruction technique. CONTRAST:  OMNIPAQUE  IOHEXOL  300 MG/ML  SOLN COMPARISON:  CT abdomen and pelvis 05/07/2023. FINDINGS: Lower chest: No acute abnormality. The liver is enlarged. There is diffuse fatty infiltration, unchanged. Gallbladder and bile ducts are within normal limits. Hepatobiliary: No focal liver abnormality is seen. No gallstones, gallbladder wall thickening, or biliary dilatation. Pancreas: Unremarkable. No pancreatic ductal dilatation or surrounding inflammatory changes. Spleen: There is a small amount of air in the bladder. The bladder is otherwise within normal limits. 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 Otherwise, the kidneys and adrenal glands are within normal limits. Adrenals/Urinary Tract: Adrenal glands are unremarkable. Kidneys are normal, without renal calculi, focal lesion, or hydronephrosis.  Bladder is unremarkable. Stomach/Bowel: Stomach is within normal limits. Appendix appears normal. No evidence of bowel wall thickening, distention, or inflammatory changes. There is a large amount of stool in the mid and proximal colon. Vascular/Lymphatic: No significant vascular findings are present. No enlarged abdominal or pelvic lymph nodes. Reproductive: There is a 3 cm cyst in the right ovary. The left ovary and uterus are within normal  limits. Other: No abdominal wall hernia or abnormality. No abdominopelvic ascites. Musculoskeletal: No acute or significant osseous findings. IMPRESSION: 1. Small amount of air in the bladder, possibly related to recent instrumentation. Correlate clinically for infection. 2. Large amount of stool in the mid and proximal colon. 3. 3 cm right ovarian simple-appearing cyst. No follow-up imaging recommended. Note: This recommendation does not apply to premenarchal patients and to those with increased risk (genetic, family history, elevated tumor markers or other high-risk factors) of ovarian cancer. Reference: JACR 2020 Feb; 17(2):248-254 4. Hepatomegaly with fatty infiltration of the liver. Electronically Signed   By: Tyron Gallon M.D.   On: 06/14/2023 17:56   DG Chest 2 View Result Date: 06/14/2023 CLINICAL DATA:  Shortness of breath. EXAM: CHEST - 2 VIEW COMPARISON:  May 07, 2023. FINDINGS: The heart size and mediastinal contours are within normal limits. Both lungs are clear. The visualized skeletal structures are unremarkable. IMPRESSION: No active cardiopulmonary disease. Electronically Signed   By: Rosalene Colon M.D.   On: 06/14/2023 13:27        Scheduled Meds:  Chlorhexidine  Gluconate Cloth  6 each Topical Q0600   feeding supplement  1 Container Oral TID BM   heparin  injection (subcutaneous)  5,000 Units Subcutaneous Q8H   lactulose   20 g Oral BID   midodrine   10 mg Oral TID WC   sodium chloride  flush  10-40 mL Intracatheter Q12H   Continuous  Infusions:  sodium chloride  Stopped (06/15/23 0736)   dextrose  20 mL/hr at 06/16/23 0600   norepinephrine  (LEVOPHED ) Adult infusion Stopped (06/15/23 1247)   piperacillin -tazobactam (ZOSYN )  IV 12.5 mL/hr at 06/16/23 0600     LOS: 1 day   Time spent:  Haydee Lipa, DO [facility]  If 7PM-7AM, please contact night-coverage [email or web address]  06/16/2023, 7:11 AM

## 2023-06-16 NOTE — Plan of Care (Signed)
  Problem: Education: Goal: Knowledge of General Education information will improve Description: Including pain rating scale, medication(s)/side effects and non-pharmacologic comfort measures Outcome: Progressing   Problem: Health Behavior/Discharge Planning: Goal: Ability to manage health-related needs will improve Outcome: Progressing   Problem: Clinical Measurements: Goal: Ability to maintain clinical measurements within normal limits will improve Outcome: Progressing Goal: Will remain free from infection Outcome: Progressing Goal: Diagnostic test results will improve Outcome: Progressing Goal: Respiratory complications will improve Outcome: Progressing Goal: Cardiovascular complication will be avoided Outcome: Progressing   Problem: Activity: Goal: Risk for activity intolerance will decrease Outcome: Progressing   Problem: Nutrition: Goal: Adequate nutrition will be maintained Outcome: Progressing   Problem: Coping: Goal: Level of anxiety will decrease Outcome: Progressing   Problem: Elimination: Goal: Will not experience complications related to bowel motility Outcome: Progressing Goal: Will not experience complications related to urinary retention Outcome: Progressing   Problem: Pain Managment: Goal: General experience of comfort will improve and/or be controlled Outcome: Progressing   Problem: Safety: Goal: Ability to remain free from injury will improve Outcome: Progressing   Problem: Skin Integrity: Goal: Risk for impaired skin integrity will decrease Outcome: Progressing   Cindy S. Bernetta Brilliant BSN, RN, CCRP, CCRN 06/16/2023 1:30 AM

## 2023-06-16 NOTE — Telephone Encounter (Signed)
 Called patient to schedule for new patient consult per jennifer lemmon . Left detailed message to call back and schedule.   Patient is needing to have a colon procedure.

## 2023-06-16 NOTE — Plan of Care (Signed)
   Problem: Education: Goal: Knowledge of General Education information will improve Description: Including pain rating scale, medication(s)/side effects and non-pharmacologic comfort measures Outcome: Progressing   Problem: Clinical Measurements: Goal: Ability to maintain clinical measurements within normal limits will improve Outcome: Progressing   Problem: Nutrition: Goal: Adequate nutrition will be maintained Outcome: Progressing

## 2023-06-17 ENCOUNTER — Other Ambulatory Visit: Payer: Self-pay

## 2023-06-17 ENCOUNTER — Other Ambulatory Visit (HOSPITAL_COMMUNITY): Payer: Self-pay

## 2023-06-17 DIAGNOSIS — R1110 Vomiting, unspecified: Secondary | ICD-10-CM | POA: Diagnosis not present

## 2023-06-17 DIAGNOSIS — R109 Unspecified abdominal pain: Secondary | ICD-10-CM | POA: Diagnosis not present

## 2023-06-17 LAB — CBC
HCT: 35.2 % — ABNORMAL LOW (ref 36.0–46.0)
Hemoglobin: 11.7 g/dL — ABNORMAL LOW (ref 12.0–15.0)
MCH: 38.9 pg — ABNORMAL HIGH (ref 26.0–34.0)
MCHC: 33.2 g/dL (ref 30.0–36.0)
MCV: 116.9 fL — ABNORMAL HIGH (ref 80.0–100.0)
Platelets: 131 10*3/uL — ABNORMAL LOW (ref 150–400)
RBC: 3.01 MIL/uL — ABNORMAL LOW (ref 3.87–5.11)
RDW: 14.2 % (ref 11.5–15.5)
WBC: 4.6 10*3/uL (ref 4.0–10.5)
nRBC: 0 % (ref 0.0–0.2)

## 2023-06-17 LAB — BASIC METABOLIC PANEL WITH GFR
Anion gap: 8 (ref 5–15)
BUN: 8 mg/dL (ref 6–20)
CO2: 26 mmol/L (ref 22–32)
Calcium: 8.3 mg/dL — ABNORMAL LOW (ref 8.9–10.3)
Chloride: 100 mmol/L (ref 98–111)
Creatinine, Ser: 0.53 mg/dL (ref 0.44–1.00)
GFR, Estimated: >60 mL/min (ref >60)
Glucose, Bld: 118 mg/dL — ABNORMAL HIGH (ref 70–99)
Potassium: 3.3 mmol/L — ABNORMAL LOW (ref 3.5–5.1)
Sodium: 134 mmol/L — ABNORMAL LOW (ref 135–145)

## 2023-06-17 LAB — GLUCOSE, CAPILLARY
Glucose-Capillary: 108 mg/dL — ABNORMAL HIGH (ref 70–99)
Glucose-Capillary: 131 mg/dL — ABNORMAL HIGH (ref 70–99)

## 2023-06-17 LAB — VITAMIN B6: Vitamin B6: 2.9 ug/L — ABNORMAL LOW (ref 3.4–65.2)

## 2023-06-17 MED ORDER — POLYETHYLENE GLYCOL 3350 17 GM/SCOOP PO POWD
17.0000 g | Freq: Two times a day (BID) | ORAL | 0 refills | Status: DC
  Filled 2023-06-17: qty 238, 7d supply, fill #0

## 2023-06-17 MED ORDER — LACTULOSE 10 GM/15ML PO SOLN
20.0000 g | Freq: Three times a day (TID) | ORAL | 1 refills | Status: DC
  Filled 2023-06-17: qty 946, 11d supply, fill #0

## 2023-06-17 MED ORDER — POTASSIUM CHLORIDE CRYS ER 20 MEQ PO TBCR
40.0000 meq | EXTENDED_RELEASE_TABLET | Freq: Once | ORAL | Status: AC
  Administered 2023-06-17: 40 meq via ORAL
  Filled 2023-06-17: qty 2

## 2023-06-17 MED ORDER — DICYCLOMINE HCL 20 MG PO TABS
20.0000 mg | ORAL_TABLET | Freq: Two times a day (BID) | ORAL | 0 refills | Status: DC
  Filled 2023-06-17: qty 60, 30d supply, fill #0

## 2023-06-17 MED ORDER — SODIUM CHLORIDE 0.9 % IV SOLN
12.5000 mg | Freq: Four times a day (QID) | INTRAVENOUS | Status: DC | PRN
  Administered 2023-06-18: 12.5 mg via INTRAVENOUS
  Filled 2023-06-17: qty 12.5

## 2023-06-17 MED ORDER — PROMETHAZINE HCL 25 MG RE SUPP
25.0000 mg | Freq: Four times a day (QID) | RECTAL | Status: DC | PRN
Start: 2023-06-17 — End: 2023-06-19

## 2023-06-17 MED ORDER — ONDANSETRON HCL 4 MG PO TABS
4.0000 mg | ORAL_TABLET | Freq: Four times a day (QID) | ORAL | 0 refills | Status: AC | PRN
  Filled 2023-06-17: qty 20, 5d supply, fill #0

## 2023-06-17 MED ORDER — PROMETHAZINE HCL 12.5 MG PO TABS
12.5000 mg | ORAL_TABLET | Freq: Four times a day (QID) | ORAL | 0 refills | Status: AC | PRN
  Filled 2023-06-17: qty 30, 8d supply, fill #0

## 2023-06-17 MED ORDER — POTASSIUM CHLORIDE 20 MEQ PO PACK
40.0000 meq | PACK | Freq: Once | ORAL | Status: DC
  Filled 2023-06-17: qty 2

## 2023-06-17 MED ORDER — AMOXICILLIN-POT CLAVULANATE 875-125 MG PO TABS
1.0000 | ORAL_TABLET | Freq: Two times a day (BID) | ORAL | 0 refills | Status: DC
  Filled 2023-06-17: qty 8, 4d supply, fill #0

## 2023-06-17 MED ORDER — PROMETHAZINE HCL 25 MG PO TABS: 12.5000 mg | ORAL_TABLET | Freq: Four times a day (QID) | ORAL | Status: DC | PRN

## 2023-06-17 MED ORDER — POTASSIUM CHLORIDE CRYS ER 20 MEQ PO TBCR
20.0000 meq | EXTENDED_RELEASE_TABLET | Freq: Every day | ORAL | 0 refills | Status: DC
  Filled 2023-06-17: qty 30, 30d supply, fill #0

## 2023-06-17 MED ORDER — LACTULOSE 10 GM/15ML PO SOLN
20.0000 g | Freq: Two times a day (BID) | ORAL | 0 refills | Status: DC
  Filled 2023-06-17: qty 236, 4d supply, fill #0

## 2023-06-17 MED ORDER — SENNOSIDES-DOCUSATE SODIUM 8.6-50 MG PO TABS
1.0000 | ORAL_TABLET | Freq: Two times a day (BID) | ORAL | 0 refills | Status: DC
  Filled 2023-06-17: qty 60, 30d supply, fill #0

## 2023-06-17 NOTE — TOC Progression Note (Signed)
 Transition of Care [facility]) - Progression Note    Patient Details  Name: Tanya Carroll MRN: [id number] Date of Birth: [birth year]  [facility] CM/SW Contact  Bari Leys, RN Phone Number: 06/17/2023, 1:39 PM  Clinical Narrative:   Teams chat received from bedside nurse that patient would like to Appeal her discharge. NCM called to patient's room, patient was able to locate her Inpatient Medicare Rights Form, NCM reviewed with patient steps to Appeal her discharge and number on form to call, pt voiced understanding, team notified.       Barriers to Discharge: Continued Medical Work up  Expected Discharge Plan and Services         Expected Discharge Date: 06/17/23                                     Social Determinants of Health (SDOH) Interventions SDOH Screenings   Food Insecurity: No Food Insecurity (06/14/2023)  Housing: Low Risk  (06/14/2023)  Transportation Needs: No Transportation Needs (06/14/2023)  Utilities: Not At Risk (06/14/2023)  Depression (PHQ2-9): Low Risk  (09/03/2021)  Social Connections: Unknown (06/12/2021)   Received from [facility]  Tobacco Use: Low Risk  (06/14/2023)    Readmission Risk Interventions     No data to display

## 2023-06-17 NOTE — Discharge Summary (Signed)
 Physician Discharge Summary  Tanya Carroll WUJ:[id number] DOB: 03/04/1985 DOA: 06/14/2023  PCP: Patient, No Pcp Per  Admit date: 06/14/2023 Discharge date: 06/17/2023  Admitted From: Home Disposition: Home  Recommendations for Outpatient Follow-up:  Follow up with PCP in 1-2 weeks Follow-up with [facility] on 06/18/2023 at 1340 as scheduled  Home Health: None Equipment/Devices: None  Discharge Condition: Stable CODE STATUS: Full Diet recommendation: Low-salt low-fat soft bland diet  Brief/Interim Summary: Tanya Carroll is a 38 y.o. female with medical history significant for alcohol abuse in remission, cirrhosis, and peripheral neuropathy, now presenting with multiple complaints including intermittent chest discomfort, abdominal pain, nausea, vomiting, numbness and tingling of the bilateral hands, and dry mouth with oral sores.    Patient admitted as above with worsening abdominal pain, nausea, vomiting with severe hypotension, initially concern for hypovolemic shock.  Etiology thought to be chronic constipation with profoundly diminished p.o. intake.  Patient recovered quite rapidly with IV fluid resuscitation and supportive care.  Patient intractable nausea vomiting has markedly improved, tolerating p.o. liquids quite well today, lengthy discussion with patient that slowly advancing diet over the next days to weeks would be reasonable to ensure no worsening abdominal symptoms or issues.  Will discharge on new GI regimen as below.  Given patient's profound and lengthy history of GI dysmotility will have patient follow-up with [facility], schedule appointment tomorrow on 06/18/2023 at 1340.  AFTERNOON UPDATE -patient now appealing discharge, anxious about discharge despite p.o. tolerance this morning and over the past 24 hours  Discharge Diagnoses:  Principal Problem:   Abdominal pain with vomiting Active Problems:   Cirrhosis of liver (HCC)   Constipation   Neuropathy   Chest  pain  Intractable abdominal pain with N/V  Severe hypotension, shock ruled out Lactic acidosis Severe chronic constipation Rule out cannabinoid hyperemesis syndrome - No acute findings on CT, exam is benign  - thought to be secondary to constipation (patient reports 1 bowel movement every 2 to 3 weeks= her based, recently worse on new narcotic regimen) - Continue lactulose , MiraLAX , Bentyl , Senokot - Advance diet as tolerated-previously tolerated liquid diet, advancing to regular with instructions to advance diet slowly over the next few weeks - Noted cannabis use likely complicating above - Patient has follow-up scheduled with [facility] on 06/18/2023 at 1:40 PM - GI symptoms unlikely to be infectious given findings/chronicity   Peripheral neuropathy, chronic - Patient complains of worsening neuropathy and recent oral sores; she has hx of neuropathy attributed to prior alcoholism and nutrient deficiencies    - Vitamin levels currently pending, recommend outpatient multivitamin in the interim as well as improved p.o. intake as tolerated -TSH, and HIV - unremarkable   Cirrhosis, chronic - MELD-Na is 18 on admission  - Resume diuretics at DC  Polycythemia - Likely hemoconcentration, improving with fluids  Atypical chest/epigastric pain - Ruled-out for ACS in ED, seems to be GI-related   Discharge Instructions  Discharge Instructions     Call MD for:  difficulty breathing, headache or visual disturbances   Complete by: As directed    Call MD for:  extreme fatigue   Complete by: As directed    Call MD for:  hives   Complete by: As directed    Call MD for:  persistant dizziness or light-headedness   Complete by: As directed    Call MD for:  persistant nausea and vomiting   Complete by: As directed    Call MD for:  severe uncontrolled pain   Complete  by: As directed    Call MD for:  temperature >100.4   Complete by: As directed    Diet - low sodium heart healthy   Complete by: As  directed    Increase activity slowly   Complete by: As directed       Allergies as of 06/17/2023       Reactions   Citrus Itching, Swelling   Keflex  [cephalexin ] Anaphylaxis, Shortness Of Breath        Medication List     STOP taking these medications    Oxycodone  HCl 10 MG Tabs   polyethylene glycol 17 g packet Commonly known as: MiraLax  Replaced by: polyethylene glycol powder 17 GM/SCOOP powder       TAKE these medications    amoxicillin-clavulanate 875-125 MG tablet Commonly known as: AUGMENTIN Take 1 tablet by mouth 2 (two) times daily for 4 days.   dicyclomine  20 MG tablet Commonly known as: BENTYL  Take 1 tablet (20 mg total) by mouth 2 (two) times daily.   famotidine  20 MG tablet Commonly known as: PEPCID  Take 1 tablet (20 mg total) by mouth every 12 (twelve) hours as needed for heartburn or indigestion.   furosemide  20 MG tablet Commonly known as: LASIX  Take 20 mg by mouth daily.   lactulose  10 GM/15ML solution Commonly known as: CHRONULAC  Take 30 mLs (20 g total) by mouth 3 (three) times daily.   Maalox Max 400-400-40 MG/5ML suspension Generic drug: alum & mag hydroxide-simeth Take 15 mLs by mouth every 8 (eight) hours as needed for indigestion.   omeprazole  20 MG capsule Commonly known as: PRILOSEC Take 1 capsule (20 mg total) by mouth daily for 14 days. What changed:  when to take this reasons to take this   ondansetron  4 MG tablet Commonly known as: ZOFRAN  Take 1 tablet (4 mg total) by mouth every 6 (six) hours as needed for nausea.   polyethylene glycol powder 17 GM/SCOOP powder Commonly known as: GLYCOLAX /MIRALAX  Dissolve 17 g in liquid and take by mouth 2 (two) times daily. Replaces: polyethylene glycol 17 g packet   potassium chloride  SA 20 MEQ tablet Commonly known as: KLOR-CON  M Take 1 tablet (20 mEq total) by mouth daily. What changed:  medication strength See the new instructions.   promethazine 12.5 MG tablet Commonly  known as: PHENERGAN Take 1 tablet (12.5 mg total) by mouth every 6 (six) hours as needed for refractory nausea / vomiting.   senna-docusate 8.6-50 MG tablet Commonly known as: Senokot-S Take 1 tablet by mouth 2 (two) times daily.   spironolactone  25 MG tablet Commonly known as: ALDACTONE  Take 25 mg by mouth daily.        Allergies  Allergen Reactions   Citrus Itching and Swelling   Keflex  [Cephalexin ] Anaphylaxis and Shortness Of Breath    Consultations: PCCM   Procedures/Studies: US  EKG SITE RITE Result Date: 06/15/2023 If Site Rite image not attached, placement could not be confirmed due to current cardiac rhythm.  DG Chest Port 1 View Result Date: 06/15/2023 CLINICAL DATA:  [id number].  Sepsis and chest pain. EXAM: PORTABLE CHEST 1 VIEW COMPARISON:  AP Lat chest yesterday at 1:22 p.m. FINDINGS: 5:16 p.m. Lungs are expiratory but appear generally clear with limited view of the bases. Heart size and vasculature, and the mediastinal configuration appear normal. No acute osseous findings. Multiple overlying monitor wires. IMPRESSION: Expiratory chest with limited view of the bases. No evidence of acute chest disease. Electronically Signed   By: Aleck Hurdle.D.  On: 06/15/2023 06:23   CT ABDOMEN PELVIS W CONTRAST Result Date: 06/14/2023 CLINICAL DATA:  Vomiting, evaluate for bowel obstruction. EXAM: CT ABDOMEN AND PELVIS WITH CONTRAST TECHNIQUE: Multidetector CT imaging of the abdomen and pelvis was performed using the standard protocol following bolus administration of intravenous contrast. RADIATION DOSE REDUCTION: This exam was performed according to the departmental dose-optimization program which includes automated exposure control, adjustment of the mA and/or kV according to patient size and/or use of iterative reconstruction technique. CONTRAST:  OMNIPAQUE  IOHEXOL  300 MG/ML  SOLN COMPARISON:  CT abdomen and pelvis 05/07/2023. FINDINGS: Lower chest: No acute abnormality.  The liver is enlarged. There is diffuse fatty infiltration, unchanged. Gallbladder and bile ducts are within normal limits. Hepatobiliary: No focal liver abnormality is seen. No gallstones, gallbladder wall thickening, or biliary dilatation. Pancreas: Unremarkable. No pancreatic ductal dilatation or surrounding inflammatory changes. Spleen: There is a small amount of air in the bladder. The bladder is otherwise within normal limits. 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 Otherwise, the kidneys and adrenal glands are within normal limits. Adrenals/Urinary Tract: Adrenal glands are unremarkable. Kidneys are normal, without renal calculi, focal lesion, or hydronephrosis. Bladder is unremarkable. Stomach/Bowel: Stomach is within normal limits. Appendix appears normal. No evidence of bowel wall thickening, distention, or inflammatory changes. There is a large amount of stool in the mid and proximal colon. Vascular/Lymphatic: No significant vascular findings are present. No enlarged abdominal or pelvic lymph nodes. Reproductive: There is a 3 cm cyst in the right ovary. The left ovary and uterus are within normal limits. Other: No abdominal wall hernia or abnormality. No abdominopelvic ascites. Musculoskeletal: No acute or significant osseous findings. IMPRESSION: 1. Small amount of air in the bladder, possibly related to recent instrumentation. Correlate clinically for infection. 2. Large amount of stool in the mid and proximal colon. 3. 3 cm right ovarian simple-appearing cyst. No follow-up imaging recommended. Note: This recommendation does not apply to premenarchal patients and to those with increased risk (genetic, family history, elevated tumor markers or other high-risk factors) of ovarian cancer. Reference: JACR 2020 Feb; 17(2):248-254 4. Hepatomegaly with fatty infiltration of the liver. Electronically Signed   By: Tyron Gallon M.D.   On:  06/14/2023 17:56   DG Chest 2 View Result Date: 06/14/2023 CLINICAL DATA:  Shortness of breath. EXAM: CHEST - 2 VIEW COMPARISON:  May 07, 2023. FINDINGS: The heart size and mediastinal contours are within normal limits. Both lungs are clear. The visualized skeletal structures are unremarkable. IMPRESSION: No active cardiopulmonary disease. Electronically Signed   By: Rosalene Colon M.D.   On: 06/14/2023 13:27     Subjective: No acute issues or events overnight, tolerating p.o. quite well this morning, eager for discharge and outpatient follow-up with GI.   Discharge Exam: Vitals:   06/17/23 0930 06/17/23 1340  BP: (!) 122/92 (!) 135/100  Pulse: (!) 110 95  Resp: 16 16  Temp: 98.4 F (36.9 C) 98.1 F (36.7 C)  SpO2: 95% 98%   Vitals:   06/17/23 0126 06/17/23 0630 06/17/23 0930 06/17/23 1340  BP: 118/87 131/87 (!) 122/92 (!) 135/100  Pulse: 93 99 (!) 110 95  Resp: 18 18 16 16   Temp: 98 F (36.7 C) 98.2 F (36.8 C) 98.4 F (36.9 C) 98.1 F (36.7 C)  TempSrc: Oral Oral Oral Oral  SpO2: 97% 97% 95% 98%  Weight:      Height:  General: Pt is alert, awake, not in acute distress Cardiovascular: RRR, S1/S2 +, no rubs, no gallops Respiratory: CTA bilaterally, no wheezing, no rhonchi Abdominal: Soft, NT, ND, bowel sounds + Extremities: no edema, no cyanosis    The results of significant diagnostics from this hospitalization (including imaging, microbiology, ancillary and laboratory) are listed below for reference.     Microbiology: Recent Results (from the past 240 hours)  MRSA Next Gen by PCR, Nasal     Status: None   Collection Time: 06/15/23  4:29 AM   Specimen: Nasal Mucosa; Nasal Swab  Result Value Ref Range Status   MRSA by PCR Next Gen NOT DETECTED NOT DETECTED Final    Comment: (NOTE) The GeneXpert MRSA Assay (FDA approved for NASAL specimens only), is one component of a comprehensive MRSA colonization surveillance program. It is not intended to diagnose  MRSA infection nor to guide or monitor treatment for MRSA infections. Test performance is not FDA approved in patients less than 61 years old. Performed at [facility], [address], [place], [place] [address]   Culture, blood (x 2)     Status: None (Preliminary result)   Collection Time: 06/15/23  7:55 AM   Specimen: Left Antecubital; Blood  Result Value Ref Range Status   Specimen Description   Final    LEFT ANTECUBITAL BOTTLES DRAWN AEROBIC ONLY Performed at [facility], [address] [place], [place] [address]    Special Requests   Final    Blood Culture results may not be optimal due to an inadequate volume of blood received in culture bottles Performed at [facility], [address], [place], [place] [address]    Culture   Final    NO GROWTH 2 DAYS Performed at [facility], [address], [place], [place] [address]    Report Status PENDING  Incomplete  Culture, blood (x 2)     Status: None (Preliminary result)   Collection Time: 06/15/23  8:12 AM   Specimen: BLOOD LEFT HAND  Result Value Ref Range Status   Specimen Description   Final    BLOOD LEFT HAND BOTTLES DRAWN AEROBIC ONLY Performed at [facility], [address] [place], [place] [address]    Special Requests   Final    Blood Culture results may not be optimal due to an inadequate volume of blood received in culture bottles Performed at [facility], [address] [place], [place] [address]    Culture   Final    NO GROWTH 2 DAYS Performed at [facility], [address], [place], [place] [address]    Report Status PENDING  Incomplete     Labs: BNP (last 3 results) Recent Labs    06/14/23 1624  BNP 62.6   Basic Metabolic Panel: Recent Labs  Lab 06/14/23 1342 06/15/23 0812 06/16/23 0529 06/17/23 0318  NA 135 138 135 134*  K 3.7 3.8 3.8 3.3*  CL 98 105 103 100  CO2 24 16* 24 26   GLUCOSE 86 49* 118* 118*  BUN 6 7 12 8   CREATININE 0.60 0.74 0.80 0.53  CALCIUM 8.6* 8.0* 8.1* 8.3*  MG 1.8  --   --   --   PHOS 3.6  --   --   --    Liver Function Tests: Recent Labs  Lab 06/14/23 1342 06/15/23 0812 06/16/23 0529  AST 50* 58* 43*  ALT 19 15 17  ALKPHOS 96 72 66  BILITOT 3.1* 2.9* 1.6*  PROT 7.2 6.9 5.8*  ALBUMIN  3.2* 3.9 3.1*   Recent Labs  Lab 06/14/23 1342  LIPASE 28   Recent Labs  Lab 06/14/23 1203  AMMONIA 47*   CBC: Recent Labs  Lab 06/14/23 1255 06/15/23 0812 06/16/23 0529 06/17/23 0318  WBC 5.7 10.5 5.6 4.6  NEUTROABS 3.2 7.1  --   --   HGB 16.0* 12.6 11.8* 11.7*  HCT 48.5* 39.9 35.3* 35.2*  MCV 115.5* 122.4* 115.7* 116.9*  PLT 174 223 166 131*   CBG: Recent Labs  Lab 06/16/23 0523 06/16/23 0756 06/16/23 1145 06/16/23 2003 06/17/23 0814  GLUCAP 109* 151* 169* 141* 108*   Thyroid  function studies Recent Labs    06/15/23 0812  TSH 0.720   Anemia work up Recent Labs    06/14/23 2106  FOLATE 2.8*   Urinalysis    Component Value Date/Time   COLORURINE AMBER (A) 06/14/2023 1843   APPEARANCEUR HAZY (A) 06/14/2023 1843   LABSPEC 1.045 (H) 06/14/2023 1843   PHURINE 5.0 06/14/2023 1843   GLUCOSEU NEGATIVE 06/14/2023 1843   HGBUR NEGATIVE 06/14/2023 1843   BILIRUBINUR SMALL (A) 06/14/2023 1843   BILIRUBINUR large (A) 11/29/2019 1636   KETONESUR 5 (A) 06/14/2023 1843   PROTEINUR NEGATIVE 06/14/2023 1843   UROBILINOGEN >=8.0 (A) 11/29/2019 1636   UROBILINOGEN 0.2 04/22/2007 1217   NITRITE NEGATIVE 06/14/2023 1843   LEUKOCYTESUR NEGATIVE 06/14/2023 1843   Sepsis Labs Recent Labs  Lab 06/14/23 1255 06/15/23 0812 06/16/23 0529 06/17/23 0318  WBC 5.7 10.5 5.6 4.6   Microbiology Recent Results (from the past 240 hours)  MRSA Next Gen by PCR, Nasal     Status: None   Collection Time: 06/15/23  4:29 AM   Specimen: Nasal Mucosa; Nasal Swab  Result Value Ref Range Status   MRSA by PCR Next Gen NOT DETECTED NOT  DETECTED Final    Comment: (NOTE) The GeneXpert MRSA Assay (FDA approved for NASAL specimens only), is one component of a comprehensive MRSA colonization surveillance program. It is not intended to diagnose MRSA infection nor to guide or monitor treatment for MRSA infections. Test performance is not FDA approved in patients less than 94 years old. Performed at [facility], [address], [place], [place] [address]   Culture, blood (x 2)     Status: None (Preliminary result)   Collection Time: 06/15/23  7:55 AM   Specimen: Left Antecubital; Blood  Result Value Ref Range Status   Specimen Description   Final    LEFT ANTECUBITAL BOTTLES DRAWN AEROBIC ONLY Performed at [facility], [address], [place], [place] [address]    Special Requests   Final    Blood Culture results may not be optimal due to an inadequate volume of blood received in culture bottles Performed at [facility], [address] [place], [place] [address]    Culture   Final    NO GROWTH 2 DAYS Performed at [facility], [address], [place], [place] [address]    Report Status PENDING  Incomplete  Culture, blood (x 2)     Status: None (Preliminary result)   Collection Time: 06/15/23  8:12 AM   Specimen: BLOOD LEFT HAND  Result Value Ref Range Status   Specimen Description   Final    BLOOD LEFT HAND BOTTLES DRAWN AEROBIC ONLY Performed at [facility], [address], [place], [place] [address]  Special Requests   Final    Blood Culture results may not be optimal due to an inadequate volume of blood received in culture bottles Performed at [facility], [address], [place], [place] [address]    Culture   Final    NO GROWTH 2 DAYS Performed at [facility], [address] [place], [place] [address]    Report Status PENDING  Incomplete    Time coordinating discharge: Over 30  minutes  SIGNED:   Haydee Lipa, DO [facility] 06/17/2023, 3:39 PM Pager   If 7PM-7AM, please contact night-coverage [email or web address]

## 2023-06-17 NOTE — Plan of Care (Signed)
   Problem: Education: Goal: Knowledge of General Education information will improve Description Including pain rating scale, medication(s)/side effects and non-pharmacologic comfort measures Outcome: Progressing

## 2023-06-18 ENCOUNTER — Ambulatory Visit: Admitting: Gastroenterology

## 2023-06-18 DIAGNOSIS — K5900 Constipation, unspecified: Secondary | ICD-10-CM | POA: Diagnosis not present

## 2023-06-18 DIAGNOSIS — K7460 Unspecified cirrhosis of liver: Secondary | ICD-10-CM | POA: Diagnosis not present

## 2023-06-18 DIAGNOSIS — G629 Polyneuropathy, unspecified: Secondary | ICD-10-CM | POA: Diagnosis not present

## 2023-06-18 DIAGNOSIS — R109 Unspecified abdominal pain: Secondary | ICD-10-CM | POA: Diagnosis not present

## 2023-06-18 LAB — CBC
HCT: 36.2 % (ref 36.0–46.0)
Hemoglobin: 11.7 g/dL — ABNORMAL LOW (ref 12.0–15.0)
MCH: 38.4 pg — ABNORMAL HIGH (ref 26.0–34.0)
MCHC: 32.3 g/dL (ref 30.0–36.0)
MCV: 118.7 fL — ABNORMAL HIGH (ref 80.0–100.0)
Platelets: 133 10*3/uL — ABNORMAL LOW (ref 150–400)
RBC: 3.05 MIL/uL — ABNORMAL LOW (ref 3.87–5.11)
RDW: 14.5 % (ref 11.5–15.5)
WBC: 4.7 10*3/uL (ref 4.0–10.5)
nRBC: 0 % (ref 0.0–0.2)

## 2023-06-18 LAB — VITAMIN B1: Vitamin B1 (Thiamine): 91.5 nmol/L (ref 66.5–200.0)

## 2023-06-18 LAB — BASIC METABOLIC PANEL WITH GFR
Anion gap: 12 (ref 5–15)
BUN: <5 mg/dL — ABNORMAL LOW (ref 6–20)
CO2: 24 mmol/L (ref 22–32)
Calcium: 8.7 mg/dL — ABNORMAL LOW (ref 8.9–10.3)
Chloride: 103 mmol/L (ref 98–111)
Creatinine, Ser: 0.47 mg/dL (ref 0.44–1.00)
GFR, Estimated: >60 mL/min (ref >60)
Glucose, Bld: 125 mg/dL — ABNORMAL HIGH (ref 70–99)
Potassium: 3.5 mmol/L (ref 3.5–5.1)
Sodium: 139 mmol/L (ref 135–145)

## 2023-06-18 LAB — GLUCOSE, CAPILLARY
Glucose-Capillary: 121 mg/dL — ABNORMAL HIGH (ref 70–99)
Glucose-Capillary: 116 mg/dL — ABNORMAL HIGH (ref 70–99)

## 2023-06-18 MED ORDER — PYRIDOXINE HCL 25 MG PO TABS
50.0000 mg | ORAL_TABLET | Freq: Every day | ORAL | Status: DC
  Administered 2023-06-18 – 2023-06-19 (×2): 50 mg via ORAL
  Filled 2023-06-18 (×2): qty 2

## 2023-06-18 MED ORDER — BISACODYL 10 MG RE SUPP
10.0000 mg | Freq: Once | RECTAL | Status: AC
  Administered 2023-06-18: 10 mg via RECTAL
  Filled 2023-06-18: qty 1

## 2023-06-18 MED ORDER — FOLIC ACID 1 MG PO TABS
1.0000 mg | ORAL_TABLET | Freq: Every day | ORAL | Status: DC
  Administered 2023-06-18 – 2023-06-19 (×2): 1 mg via ORAL
  Filled 2023-06-18 (×2): qty 1

## 2023-06-18 MED ORDER — OXYCODONE HCL 5 MG PO TABS
10.0000 mg | ORAL_TABLET | Freq: Four times a day (QID) | ORAL | Status: DC | PRN
  Administered 2023-06-18 – 2023-06-19 (×2): 10 mg via ORAL
  Filled 2023-06-18 (×2): qty 2

## 2023-06-18 MED ORDER — MAGNESIUM CITRATE PO SOLN
1.0000 | Freq: Once | ORAL | Status: AC
  Administered 2023-06-18: 1 via ORAL
  Filled 2023-06-18: qty 296

## 2023-06-18 MED ORDER — AMOXICILLIN-POT CLAVULANATE 875-125 MG PO TABS
1.0000 | ORAL_TABLET | Freq: Two times a day (BID) | ORAL | Status: DC
  Administered 2023-06-18: 1 via ORAL
  Filled 2023-06-18: qty 1

## 2023-06-18 NOTE — Progress Notes (Signed)
 Mobility Specialist - Progress Note   06/18/23 1441  Mobility  Activity Ambulated independently in hallway  Level of Assistance Independent  Assistive Device None  Distance Ambulated (ft) 1000 ft  Activity Response Tolerated well  Mobility Referral Yes  Mobility visit 1 Mobility  Mobility Specialist Start Time (ACUTE ONLY) 1433  Mobility Specialist Stop Time (ACUTE ONLY) 1440  Mobility Specialist Time Calculation (min) (ACUTE ONLY) 7 min   Pt received in bed and agreeable to mobility. No complaints during session. Pt to bed after session with all needs met.    [facility]

## 2023-06-18 NOTE — Progress Notes (Addendum)
 Progress Note   Patient: Tanya Carroll ZOX:[id number] DOB: 14-Feb-1985 DOA: 06/14/2023     3 DOS: the patient was seen and examined on 06/18/2023   Brief hospital course: 38 year old woman PMH alcohol abuse in remission, cirrhosis, peripheral neuropathy, presented with multiple complaints including chest discomfort, abdominal pain, nausea, vomiting, numbness and tingling bilateral hands, dry mouth with oral sores.  Admitted for nausea, vomiting, abdominal pain.  Subsequently developed hypotension with SBP in the 70s and lactic acid uptrending.  Transferred to stepdown unit treated with fluids, albumin  and midodrine .  Ended up requiring peripheral Levophed .  Seen by pulmonary critical care.  Thought to be hypovolemic in the setting of poor oral intake, nausea and vomiting.  Occult sepsis was considered but no clear source of infection.  Imaging unremarkable.  Initially there was concern for hypovolemic shock, however given rapid resolution with fluids, thought to be secondary to poor oral intake, volume depletion.  Consideration given to constipation.   Consultants PCCM  Procedures/Events 5/18 PICC  Assessment and Plan: Intractable abdominal pain with N/V  Severe hypotension, shock ruled out Lactic acidosis Severe chronic constipation Possible cannabinoid hyperemesis syndrome After being admitted developed severe hypotension requiring transfer to ICU.  Rapidly improved with IV fluids.  Briefly required vasopressors.  Although sepsis was considered appears ruled out at this time.  No evidence of infection. Urinalysis, imaging unremarkable.  Stop antibiotics. Lactic acidosis resolved with fluids. Patient gives a history of 1 bowel movement every 1 to 2 weeks and very poor oral intake and chronic vomiting. CT imaging was negative for acute abnormalities but did reveal large stool burden.  Patient reports only small bowel movements here although nurse tech thinks there was a large bowel  movement yesterday. Will continue bowel regimen, give a suppository and a bottle of magnesium  citrate. She had a GI follow-up scheduled for today but refused discharge and appealed discharge. Will go ahead and ask GI to see tomorrow while inpatient. Will stop antibiotics and observe.  Chronic pain Takes oxycodone  10 mg 4 times a day.  This is certainly complicating her clinical picture including constipation.   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  Multivitamin. Suggest follow-up with neurology at [facility], last seen August 2022   Possible cirrhosis Hepatomegaly History of alcohol use Reported cirrhosis, imaging does show fatty infiltration of the liver.  I do not see any clear documentation of cirrhosis. Will need GI follow-up  Polycythemia artifact secondary to dehydration Mild normocytic anemia Hgb stable, follow-up as an outpatient  Atypical chest/epigastric pain - Ruled-out for ACS in ED, seems to be GI-related   Mild thrombocytopenia Follow-up as an outpatient  It may be that her presentation was related to chronic constipation resulting in abdominal fullness, recurrent nausea and vomiting and poor oral intake.  Certainly she responded to fluids quickly and her clinical picture is most consistent with severe volume depletion.  First step is aggressive bowel regimen.  It is not clear what significance of charted bowel movements is.  She will need close outpatient follow-up with gastroenterology.  If regular bowel movements do not resolve her issues, would consider ruling out gastroparesis given her neuropathy.  Family is quite concerned about another family member that died of liver cancer.  There is no evidence at this time of  cancer  or other severe abnormalities.    Subjective:  He was able to eat a little bit of breakfast this morning without vomiting.  No appetite for lunch but is drinking fluids.  Eating a little bit of dinner now.  Patient, mother, grandmother relate history of chronic constipation with 1 bowel movement meant every 1 to 2 weeks.  Reportedly has not previously seen a GI doctor for this.  Has seen GI in the past for cirrhosis.  Physical Exam: Vitals:   06/17/23 1811 06/17/23 2037 06/18/23 0554 06/18/23 1334  BP: (!) 124/92 129/84 129/86 123/89  Pulse: (!) 104 100 95 (!) 105  Resp: 16 18 18 16   Temp: 98 F (36.7 C) 97.8 F (36.6 C) 97.7 F (36.5 C) 98.3 F (36.8 C)  TempSrc: Oral Oral Oral Oral  SpO2: 97% 97% 100% 97%  Weight:      Height:       Physical Exam Vitals reviewed.  Constitutional:      General: She is not in acute distress.    Appearance: She is not ill-appearing or toxic-appearing.     Comments: Eating a little bit of dinner.  Cardiovascular:     Rate and Rhythm: Normal rate and regular rhythm.     Heart sounds: No murmur heard. Pulmonary:     Effort: Pulmonary effort is normal. No respiratory distress.     Breath sounds: No wheezing, rhonchi or rales.  Abdominal:     General: There is no distension.     Palpations: Abdomen is soft.     Tenderness: There is no abdominal tenderness. There is no guarding.  Musculoskeletal:     Right lower leg: No edema.     Left lower leg: No edema.  Neurological:     Mental Status: She is alert.  Psychiatric:        Mood and Affect: Mood normal.        Behavior: Behavior normal.     Data Reviewed: CBG stable Basic metabolic panel unremarkable Hemoglobin stable 11.7 Platelets stable 133  Family Communication: Mother, grandmother at bedside  Disposition: Status is: Inpatient Remains inpatient appropriate because: Appealing discharge     Time spent: 50 minutes  Author: Jerline Moon, MD 06/18/2023 5:17  PM  For on call review [email or web address].

## 2023-06-18 NOTE — Hospital Course (Addendum)
 38 year old woman PMH alcohol abuse in remission, cirrhosis, peripheral neuropathy, presented with multiple complaints including chest discomfort, abdominal pain, nausea, vomiting, numbness and tingling bilateral hands, dry mouth with oral sores.  Admitted for nausea, vomiting, abdominal pain.  Subsequently developed hypotension with SBP in the 70s and lactic acid uptrending.  Transferred to stepdown unit treated with fluids, albumin  and midodrine .  Ended up requiring peripheral Levophed .  Seen by pulmonary critical care.  Thought to be hypovolemic in the setting of poor oral intake, nausea and vomiting.  Occult sepsis was considered but no clear source of infection.  Imaging unremarkable.  Initially there was concern for hypovolemic shock, however given rapid resolution with fluids, thought to be secondary to poor oral intake, volume depletion.  Consideration given to constipation.   Consultants PCCM  Procedures/Events 5/18 PICC

## 2023-06-18 NOTE — TOC Progression Note (Addendum)
 Transition of Care [facility]) - Progression Note    Patient Details  Name: Tanya Carroll MRN: [id number] Date of Birth: 01-20-1986  Transition of Care [facility]) CM/SW Contact  Bari Leys, RN Phone Number: 06/18/2023, 12:18 PM  Clinical Narrative: Met with patient and her mother at  bedside, confirmed Appeal of Discharge was initiated yesterday by family, await determination. TOC will continue to follow.     4:11pm Call received to [facility] CMA informing patient lost discharge appeal, last covered day today. Met with pt and mother at bedside and informed of determination, pt plans to call insurance to see if there are other Appeal options, otherwise she understands dc needs to take place today,        Barriers to Discharge: Continued Medical Work up  Expected Discharge Plan and Services         Expected Discharge Date: 06/17/23                                     Social Determinants of Health (SDOH) Interventions SDOH Screenings   Food Insecurity: No Food Insecurity (06/14/2023)  Housing: Low Risk  (06/14/2023)  Transportation Needs: No Transportation Needs (06/14/2023)  Utilities: Not At Risk (06/14/2023)  Depression (PHQ2-9): Low Risk  (09/03/2021)  Social Connections: Unknown (06/12/2021)   Received from [facility], [facility]  Tobacco Use: Low Risk  (06/14/2023)    Readmission Risk Interventions     No data to display

## 2023-06-19 ENCOUNTER — Other Ambulatory Visit (HOSPITAL_COMMUNITY): Payer: Self-pay

## 2023-06-19 DIAGNOSIS — K5900 Constipation, unspecified: Secondary | ICD-10-CM | POA: Diagnosis not present

## 2023-06-19 DIAGNOSIS — R1110 Vomiting, unspecified: Secondary | ICD-10-CM | POA: Diagnosis not present

## 2023-06-19 DIAGNOSIS — R109 Unspecified abdominal pain: Secondary | ICD-10-CM | POA: Diagnosis not present

## 2023-06-19 DIAGNOSIS — G629 Polyneuropathy, unspecified: Secondary | ICD-10-CM | POA: Diagnosis not present

## 2023-06-19 LAB — BASIC METABOLIC PANEL WITH GFR
Anion gap: 8 (ref 5–15)
BUN: <5 mg/dL — ABNORMAL LOW (ref 6–20)
CO2: 26 mmol/L (ref 22–32)
Calcium: 8.8 mg/dL — ABNORMAL LOW (ref 8.9–10.3)
Chloride: 103 mmol/L (ref 98–111)
Creatinine, Ser: 0.46 mg/dL (ref 0.44–1.00)
GFR, Estimated: >60 mL/min (ref >60)
Glucose, Bld: 95 mg/dL (ref 70–99)
Potassium: 3.5 mmol/L (ref 3.5–5.1)
Sodium: 137 mmol/L (ref 135–145)

## 2023-06-19 LAB — GLUCOSE, CAPILLARY: Glucose-Capillary: 110 mg/dL — ABNORMAL HIGH (ref 70–99)

## 2023-06-19 MED ORDER — FOLIC ACID 1 MG PO TABS: 1.0000 mg | ORAL_TABLET | Freq: Every day | ORAL | Status: DC

## 2023-06-19 MED ORDER — PYRIDOXINE HCL 50 MG PO TABS: 50.0000 mg | ORAL_TABLET | Freq: Every day | ORAL | Status: DC

## 2023-06-19 NOTE — TOC Transition Note (Signed)
 Transition of Care [facility]) - Discharge Note   Patient Details  Name: Tanya Carroll MRN: [id number] Date of Birth: [birth year]  Transition of Care [facility]) CM/SW Contact:  Bari Leys, RN Phone Number: 06/19/2023, 1:56 PM   Clinical Narrative:   Met with patient and her mother at bedside, provided with Second Level Appeal Letter notification of upheld denial of Appeal of Discharge, pt and mother voiced understanding, patient's mother reports patient had consult with Gastroenterologist with an outpatient follow up plan and the are ready for dc today. No further questions/concerns. No further TOC needs identified at this time.     Final next level of care: Home/Self Care Barriers to Discharge: Barriers Resolved   Patient Goals and CMS Choice Patient states their goals for this hospitalization and ongoing recovery are:: return home          Discharge Placement                       Discharge Plan and Services Additional resources added to the After Visit Summary for                                       Social Drivers of Health (SDOH) Interventions SDOH Screenings   Food Insecurity: No Food Insecurity (06/14/2023)  Housing: Low Risk  (06/14/2023)  Transportation Needs: No Transportation Needs (06/14/2023)  Utilities: Not At Risk (06/14/2023)  Depression (PHQ2-9): Low Risk  (09/03/2021)  Social Connections: Unknown (06/12/2021)   [facility]  Tobacco Use: Low Risk  (06/14/2023)     Readmission Risk Interventions    06/19/2023    1:56 PM  Readmission Risk Prevention Plan  Transportation Screening Complete  PCP or Specialist Appt within 5-7 Days Complete  Home Care Screening Complete  Medication Review (RN CM) Complete

## 2023-06-19 NOTE — Plan of Care (Signed)

## 2023-06-19 NOTE — Consult Note (Signed)
 Consultation  Referring Provider: Dr. Jerilynn Montenegro     Primary Care Physician:  Patient, No Pcp Per Primary Gastroenterologist: Para Bold (appt with [facility] next week          Reason for Consultation: Constipation         HPI:   Tanya Carroll is a 38 y.o. female with a past medical history as listed below including possible cirrhosis (with history of alcohol abuse in the past), who initially presented to the hospital on 06/15/2023 with a few day history of chest discomfort, tightness, nausea, vomiting and constipation.    At time of admission patient was admitted with nausea and vomiting, then transferred to stepdown with subsequent ICU level of care with progressive hypotension and peripheral vasopressor was started on 5/18.  This was suspected hypovolemic in the setting of poor p.o. intake and nausea and vomiting preceding the hospitalization.  Also had lactic acidosis thought in the setting of hypotension.  Polycythemia noted.  Patient had a CT scan which was clear and of suspected she had a viral gastroenteritis.  Did report a history of cirrhosis.  Lasix  is on hold given hypotension.    Per hospitalist notes yesterday patient had rapid resolution with fluids for hypotension.  CT initially showed no acute abnormalities but showed a large stool burden.  Patient reports only small bowel movement since in the hospital.  Apparently she refused discharge.  She is on Oxycodone  10 mg 4 times a day.    Today, patient and her mother explain her chronic GI issues, which I think are mostly related to her chronic constipation.  She explains that she constantly has episodes of nausea and vomiting, typically when she goes 3 weeks in between a bowel movement.  She has been into the hospital on 4 separate occasions for these issues and told she had a variety of things including gastritis.  Tells me that she was started on MiraLAX  by 1 provider and it worked for a while, but then stopped working.  She does take  Oxycodone  4 times daily chronically for neuropathic pain.  Describes that when she gets a constipated pain starts on the right side of her abdomen and radiates into her back and then she will often have nausea and vomiting.  Associated symptoms include early satiety.    Also discusses history of possible cirrhosis.  Apparently followed at [facility] and they have done a recent ultrasound told her there is "no change".  She tells me she used to have a drinking problem but has been sober for the past 6 months.    Her mother is reasonably concerned given that she had another daughter that complained of same abdominal pain and was diagnosed with pancreatic cancer and died within a few months.    This morning able to tolerate about 75% of an omelette, a muffin and a few other things off her breakfast tray with no nausea or vomiting.    Denies fever, chills, blood in her stool, weight loss or symptoms that awaken her from sleep.  GI history:  04/24/2022 right upper quadrant ultrasound showed hepatomegaly and diffuse hepatic steatosis, micronodular contour of the liver which was thought possibly related to cirrhosis with no suspicious focal lesion  Past Medical History:  Diagnosis Date   Cirrhosis of liver (HCC) 06/14/2023   Hypertension    Neuropathy    Placenta previa     History reviewed. No pertinent surgical history.  Family History  Problem Relation Age of  Onset   Hypertension Mother    Hypertension Sister     Social History   Tobacco Use   Smoking status: Never   Smokeless tobacco: Never  Vaping Use   Vaping status: Never Used  Substance Use Topics   Alcohol use: Yes    Alcohol/week: 2.0 standard drinks of alcohol    Types: 2 Shots of liquor per week    Comment: Once or twice a week.   Drug use: No    Prior to Admission medications   Medication Sig Start Date End Date Taking? Authorizing Provider  alum & mag hydroxide-simeth (MAALOX MAX) 400-400-40 MG/5ML  suspension Take 15 mLs by mouth every 8 (eight) hours as needed for indigestion. 05/21/23  Yes Owen Blowers P, DO  amoxicillin-clavulanate (AUGMENTIN) 875-125 MG tablet Take 1 tablet by mouth 2 (two) times daily for 4 days. 06/17/23 06/21/23 Yes Haydee Lipa, MD  famotidine  (PEPCID ) 20 MG tablet Take 1 tablet (20 mg total) by mouth every 12 (twelve) hours as needed for heartburn or indigestion. 05/21/23  Yes Owen Blowers P, DO  furosemide  (LASIX ) 20 MG tablet Take 20 mg by mouth daily. 09/02/21  Yes [provider]  omeprazole  (PRILOSEC) 20 MG capsule Take 1 capsule (20 mg total) by mouth daily for 14 days. Patient taking differently: Take 20 mg by mouth daily as needed (heartburn). 01/31/23 06/13/24 Yes Smoot, Genevive Ket, PA-C  Oxycodone  HCl 10 MG TABS Take 10 mg by mouth 4 (four) times daily. 08/08/21  Yes [provider]  polyethylene glycol (MIRALAX ) 17 g packet Take 17 g by mouth daily. 05/07/23  Yes Trish Furl, MD  spironolactone  (ALDACTONE ) 25 MG tablet Take 25 mg by mouth daily.   Yes [provider]  dicyclomine  (BENTYL ) 20 MG tablet Take 1 tablet (20 mg total) by mouth 2 (two) times daily. 06/17/23   Haydee Lipa, MD  lactulose  (CHRONULAC ) 10 GM/15ML solution Take 30 mLs (20 g total) by mouth 3 (three) times daily. 06/17/23   Haydee Lipa, MD  ondansetron  (ZOFRAN ) 4 MG tablet Take 1 tablet (4 mg total) by mouth every 6 (six) hours as needed for nausea. 06/17/23   Haydee Lipa, MD  polyethylene glycol powder (GLYCOLAX /MIRALAX ) 17 GM/SCOOP powder Dissolve 17 g in liquid and take by mouth 2 (two) times daily. 06/17/23   Haydee Lipa, MD  potassium chloride  SA (KLOR-CON  M) 20 MEQ tablet Take 1 tablet (20 mEq total) by mouth daily. 06/17/23   Haydee Lipa, MD  promethazine (PHENERGAN) 12.5 MG tablet Take 1 tablet (12.5 mg total) by mouth every 6 (six) hours as needed for refractory nausea / vomiting. 06/17/23   Haydee Lipa, MD   senna-docusate (SENOKOT-S) 8.6-50 MG tablet Take 1 tablet by mouth 2 (two) times daily. 06/17/23   Haydee Lipa, MD    Current Facility-Administered Medications  Medication Dose Route Frequency Provider Last Rate Last Admin   acetaminophen  (TYLENOL ) tablet 650 mg  650 mg Oral Q6H PRN Hunsucker, Archer Kobs, MD   650 mg at 06/19/23 0935   Or   acetaminophen  (TYLENOL ) suppository 650 mg  650 mg Rectal Q6H PRN Hunsucker, Archer Kobs, MD       alum & mag hydroxide-simeth (MAALOX/MYLANTA) 200-200-20 MG/5ML suspension 15 mL  15 mL Oral Q6H PRN Hunsucker, Archer Kobs, MD       Chlorhexidine  Gluconate Cloth 2 % PADS 6 each  6 each Topical Q0600 Hunsucker, Archer Kobs, MD   6 each at  06/19/23 4401   dicyclomine  (BENTYL ) tablet 20 mg  20 mg Oral BID Haydee Lipa, MD   20 mg at 06/19/23 0272   feeding supplement (BOOST / RESOURCE BREEZE) liquid 1 Container  1 Container Oral TID BM Hunsucker, Archer Kobs, MD   1 Container at 06/19/23 5366   folic acid  (FOLVITE ) tablet 1 mg  1 mg Oral Daily Lonita Roach, MD   1 mg at 06/19/23 4403   furosemide  (LASIX ) tablet 20 mg  20 mg Oral Daily Haydee Lipa, MD   20 mg at 06/19/23 4742   heparin  injection 5,000 Units  5,000 Units Subcutaneous Q8H Hunsucker, Archer Kobs, MD   5,000 Units at 06/19/23 0544   lactulose  (CHRONULAC ) 10 GM/15ML solution 20 g  20 g Oral BID Hunsucker, Archer Kobs, MD   20 g at 06/18/23 5956   ondansetron  (ZOFRAN ) tablet 4 mg  4 mg Oral Q6H PRN Hunsucker, Archer Kobs, MD   4 mg at 06/18/23 0800   Or   ondansetron  (ZOFRAN ) injection 4 mg  4 mg Intravenous Q6H PRN Hunsucker, Archer Kobs, MD   4 mg at 06/18/23 1726   oxyCODONE  (Oxy IR/ROXICODONE ) immediate release tablet 10 mg  10 mg Oral QID PRN Lonita Roach, MD   10 mg at 06/19/23 0935   pantoprazole  (PROTONIX ) EC tablet 40 mg  40 mg Oral Daily Haydee Lipa, MD   40 mg at 06/19/23 3875   polyethylene glycol (MIRALAX  / GLYCOLAX ) packet 17 g  17 g Oral BID Haydee Lipa, MD   17 g at 06/18/23 0855   promethazine  (PHENERGAN ) tablet 12.5 mg  12.5 mg Oral Q6H PRN Haydee Lipa, MD       Or   promethazine  (PHENERGAN ) 12.5 mg in sodium chloride  0.9 % 50 mL IVPB  12.5 mg Intravenous Q6H PRN Haydee Lipa, MD 150 mL/hr at 06/18/23 2027 12.5 mg at 06/18/23 2027   Or   promethazine  (PHENERGAN ) suppository 25 mg  25 mg Rectal Q6H PRN Haydee Lipa, MD       pyridOXINE  (VITAMIN B6) tablet 50 mg  50 mg Oral Daily Lonita Roach, MD   50 mg at 06/19/23 6433   senna-docusate (Senokot-S) tablet 1 tablet  1 tablet Oral BID Haydee Lipa, MD   1 tablet at 06/19/23 2951   sodium chloride  flush (NS) 0.9 % injection 10-40 mL  10-40 mL Intracatheter Q12H Hunsucker, Archer Kobs, MD   10 mL at 06/19/23 8841   sodium chloride  flush (NS) 0.9 % injection 10-40 mL  10-40 mL Intracatheter PRN Hunsucker, Archer Kobs, MD       spironolactone  (ALDACTONE ) tablet 25 mg  25 mg Oral Daily Haydee Lipa, MD   25 mg at 06/19/23 6606    Allergies as of 06/14/2023 - Review Complete 06/14/2023  Allergen Reaction Noted   Citrus Itching and Swelling 09/15/2019     Review of Systems:    Constitutional: No weight loss, fever or chills Cardiovascular: No chest pain Respiratory: No SOB  Gastrointestinal: See HPI and otherwise negative Genitourinary: No dysuria  Neurological: No headache, dizziness or syncope Musculoskeletal: No new muscle or joint pain Hematologic: No bleeding  Psychiatric: No history of depression or anxiety    Physical Exam:  Vital signs in last 24 hours: Temp:  [97.9 F (36.6 C)-98.3 F (36.8 C)] 97.9 F (36.6 C) (05/22 0515) Pulse Rate:  [96-105] 96 (05/22 0515) Resp:  [16-18] 18 (05/22 0515)  BP: (123-130)/(89-91) 130/91 (05/22 0515) SpO2:  [97 %-100 %] 98 % (05/22 0515) Last BM Date : 06/18/23 General:   Pleasant AA female appears to be in NAD, Well developed, Well nourished, alert and cooperative Head:  Normocephalic  and atraumatic. Eyes:   PEERL, EOMI. No icterus. Conjunctiva pink. Ears:  Normal auditory acuity. Neck:  Supple Throat: Oral cavity and pharynx without inflammation, swelling or lesion. Teeth in good condition. Lungs: Respirations even and unlabored. Lungs clear to auscultation bilaterally.   No wheezes, crackles, or rhonchi.  Heart: Normal S1, S2. No MRG. Regular rate and rhythm. No peripheral edema, cyanosis or pallor.  Abdomen:  Soft, nondistended, nontender. No rebound or guarding. Normal bowel sounds. No appreciable masses or hepatomegaly. Rectal:  Not performed.  Msk:  Symmetrical without gross deformities. Peripheral pulses intact.  Extremities:  Without edema, no deformity or joint abnormality. Normal ROM, normal sensation. Neurologic:  Alert and  oriented x4;  grossly normal neurologically.  Skin:   Dry and intact without significant lesions or rashes. Psychiatric: Demonstrates good judgement and reason without abnormal affect or behaviors.  LAB RESULTS: Recent Labs    06/17/23 0318 06/18/23 0533  WBC 4.6 4.7  HGB 11.7* 11.7*  HCT 35.2* 36.2  PLT 131* 133*   BMET Recent Labs    06/17/23 0318 06/18/23 0533 06/19/23 0512  NA 134* 139 137  K 3.3* 3.5 3.5  CL 100 103 103  CO2 26 24 26   GLUCOSE 118* 125* 95  BUN 8 <5* <5*  CREATININE 0.53 0.47 0.46  CALCIUM 8.3* 8.7* 8.8*    Impression / Plan:   Impression: 1.  Nausea and vomiting: This was present on admission thought related to a viral gastroenteritis, complicated by hypotension and the need for Levophed  for a brief period of time, but resuscitated with fluids, but patient also admits to marijuana use, CT imaging negative other than a large stool burden, the symptoms have resolved and patient tolerated breakfast this morning; likely viral gastroenteritis as suspected, with a chronic aspect of nausea/vomiting related to chronic constipation 2.  Chronic pain: On Oxycodone  10 mg 4 times daily 3.  Constipation: as  witnessed on CT, has had some bowel movements here after mag citrate and suppository, chronic issues related to oxycodone  as above sometimes going 3 weeks in between a good bowel movement 4.  Possible cirrhosis: Apparently told this at some point, but has had further imaging and this is never been confirmed 5.  Polycythemia 6.  Atypical chest/epigastric pain: Rule out for ACS in the ED-thought GI related 7.  Mild thrombocytopenia  Plan: 1.  At this point patient has follow-up already with us  next week.  Would recommend that she keep this appointment. 2.  For now she will start MiraLAX  twice daily and titrate up to 4 times a day as needed to keep regular bowel movements.  If this is not working for her by Monday she is going to call her clinic/message us .  At that point recommend sending in [facility] for her to combat her opioids. 3.  I was able to explain how constipation can cause chronic nausea and early satiety.  She did have a reassuring CT which is the same study we do to consider any type of cancer. 4.  Will also need observation/further workup of her possible cirrhosis in the outpatient setting. 5.  I do believe it is okay for the patient to be discharged.  We also talked about the fact that if she had a  viral gastroenteritis she could have some postinfectious gastroparesis for the next few days and she should only eat as much as she tolerates.  Thank for kind consultation, we will sign off and see this patient in a week.  Kathy Parker Ghalia Reicks  06/19/2023, 10:26 AM

## 2023-06-19 NOTE — Discharge Summary (Addendum)
 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  Multivitamin. 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  Admitted for nausea, vomiting, abdominal pain.  Subsequently developed hypotension with SBP in the 70s and lactic acid uptrending.  Transferred to stepdown unit treated with fluids, albumin  and midodrine .  Ended up requiring peripheral Levophed .  Seen by pulmonary critical care.  Thought to be hypovolemic in the setting of poor oral intake, nausea and vomiting.  Occult sepsis was considered but no clear source of infection.  Imaging unremarkable.  Initially there was concern for hypovolemic shock, however  given rapid resolution with fluids, thought to be secondary to poor oral intake, volume depletion.  Precipitating event probably chronic oral intake and vomiting secondary to early satiety and severe constipation.   Consultants PCCM GI  Procedures/Events 5/18 PICC  Intractable abdominal pain with N/V  Severe hypotension, shock ruled out Lactic acidosis Severe chronic constipation Possible cannabinoid hyperemesis syndrome After being admitted developed severe hypotension requiring transfer to ICU.  Rapidly improved with IV fluids.  Briefly required vasopressors.  Although sepsis was considered appears ruled out at this time.  No evidence of infection. Urinalysis, imaging unremarkable.  Stopped antibiotics. Lactic acidosis resolved with fluids. Patient gives a history of 1 bowel movement every 1 to 2 weeks and very poor oral intake and chronic vomiting. CT imaging was negative for acute abnormalities but did reveal large stool burden.  Responded very well to aggressive bowel regimen.  Seen by GI with arrangement for outpatient follow-up, bowel regiment, further evaluation for possible cirrhosis.   Chronic pain Takes oxycodone  10 mg 4 times a day.  This is certainly complicating her clinical picture including constipation.   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  Multivitamin. Suggest follow-up with neurology at [facility], last seen August 2022   Possible cirrhosis Hepatomegaly History of alcohol use Reported cirrhosis, imaging does show fatty infiltration of the liver.  I do not see any clear documentation of cirrhosis. Will need GI follow-up   Polycythemia artifact secondary to dehydration Mild normocytic anemia Hgb stable,  follow-up as an outpatient   Atypical chest/epigastric pain - Ruled-out for ACS in ED, seems to be GI-related    Mild thrombocytopenia Follow-up as an outpatient  Disposition: Home Diet recommendation:  Discharge Diet Orders (From admission, onward)     Start     Ordered   06/17/23 0000  Diet - low sodium heart healthy        06/17/23 1311           Regular diet DISCHARGE MEDICATION: Allergies as of 06/19/2023       Reactions   Citrus Itching, Swelling   Keflex  [cephalexin ] Anaphylaxis, Shortness Of Breath        Medication List     STOP taking these medications    polyethylene glycol 17 g packet Commonly known as: MiraLax  Replaced by: polyethylene glycol powder 17 GM/SCOOP powder       TAKE these medications    dicyclomine  20 MG tablet Commonly known as: BENTYL  Take 1 tablet (20 mg total) by mouth 2 (two) times daily.   famotidine  20 MG tablet Commonly known as: PEPCID  Take 1 tablet (20 mg total) by mouth every 12 (twelve) hours as needed for heartburn or indigestion.   folic acid 1 MG tablet Commonly known as: FOLVITE Take 1 tablet (1 mg total) by mouth daily. Start taking on: Jun 20, 2023   furosemide  20 MG tablet Commonly known as: LASIX  Take 20 mg by mouth daily.   Maalox Max 400-400-40 MG/5ML suspension Generic drug: alum & mag hydroxide-simeth Take 15 mLs by mouth every 8 (eight) hours as needed for indigestion.   omeprazole  20 MG capsule Commonly known as: PRILOSEC Take 1 capsule (20 mg total) by mouth daily for 14 days. What changed:  when to take this reasons to take this   ondansetron  4 MG tablet Commonly known as: ZOFRAN  Take 1 tablet (4 mg total) by mouth every 6 (six) hours as needed for nausea.   Oxycodone  HCl 10 MG Tabs Take 10 mg by mouth 4 (four) times daily.   polyethylene glycol powder 17 GM/SCOOP powder Commonly known as: GLYCOLAX /MIRALAX  Dissolve 17 g in liquid and take by mouth 2 (two) times daily. Replaces:  polyethylene glycol 17 g packet   potassium chloride  SA 20 MEQ tablet Commonly known as: KLOR-CON  M Take 1 tablet (20 mEq total) by mouth daily. What changed:  medication strength See the new instructions.   promethazine 12.5 MG tablet Commonly known as: PHENERGAN Take 1 tablet (12.5 mg total) by mouth every 6 (six) hours as needed for refractory nausea / vomiting.   pyridOXINE 50 MG tablet Commonly known as: B-6 Take 1 tablet (50 mg total) by mouth daily. Start taking on: Jun 20, 2023   spironolactone  25 MG tablet Commonly known as: ALDACTONE  Take 25 mg by mouth daily.   Stool Softener/Laxative 50-8.6 MG tablet Generic drug: senna-docusate Take 1 tablet by mouth 2 (two) times daily.        Follow-up Information     Garr Kalata, PA-C Follow up on 06/27/2023.   Specialty: Gastroenterology Why: [address] information: [address] [place] [address] [phone]                Eating very well today  Discharge Exam: Filed Weights   06/14/23 1156 06/14/23 2049 06/15/23 0051  Weight: 74.8 kg 76.2 kg 76.2 kg   Physical Exam Vitals reviewed.  Constitutional:      General: She is not in acute distress.    Appearance: She is not ill-appearing or toxic-appearing.  Cardiovascular:     Rate and Rhythm: Normal rate and regular rhythm.     Heart sounds: No murmur  heard. Pulmonary:     Effort: Pulmonary effort is normal. No respiratory distress.     Breath sounds: No wheezing, rhonchi or rales.  Neurological:     Mental Status: She is alert.  Psychiatric:        Mood and Affect: Mood normal.        Behavior: Behavior normal.      Condition at discharge: good  The results of significant diagnostics from this hospitalization (including imaging, microbiology, ancillary and laboratory) are listed below for reference.   Imaging Studies: US  EKG SITE RITE Result Date: 06/15/2023 If Site Rite image not attached, placement could not be confirmed due  to current cardiac rhythm.  DG Chest Port 1 View Result Date: 06/15/2023 CLINICAL DATA:  [id number].  Sepsis and chest pain. EXAM: PORTABLE CHEST 1 VIEW COMPARISON:  AP Lat chest yesterday at 1:22 p.m. FINDINGS: 5:16 p.m. Lungs are expiratory but appear generally clear with limited view of the bases. Heart size and vasculature, and the mediastinal configuration appear normal. No acute osseous findings. Multiple overlying monitor wires. IMPRESSION: Expiratory chest with limited view of the bases. No evidence of acute chest disease. Electronically Signed   By: Denman Fischer M.D.   On: 06/15/2023 06:23   CT ABDOMEN PELVIS W CONTRAST Result Date: 06/14/2023 CLINICAL DATA:  Vomiting, evaluate for bowel obstruction. EXAM: CT ABDOMEN AND PELVIS WITH CONTRAST TECHNIQUE: Multidetector CT imaging of the abdomen and pelvis was performed using the standard protocol following bolus administration of intravenous contrast. RADIATION DOSE REDUCTION: This exam was performed according to the departmental dose-optimization program which includes automated exposure control, adjustment of the mA and/or kV according to patient size and/or use of iterative reconstruction technique. CONTRAST:  OMNIPAQUE  IOHEXOL  300 MG/ML  SOLN COMPARISON:  CT abdomen and pelvis 05/07/2023. FINDINGS: Lower chest: No acute abnormality. The liver is enlarged. There is diffuse fatty infiltration, unchanged. Gallbladder and bile ducts are within normal limits. Hepatobiliary: No focal liver abnormality is seen. No gallstones, gallbladder wall thickening, or biliary dilatation. Pancreas: Unremarkable. No pancreatic ductal dilatation or surrounding inflammatory changes. Spleen: There is a small amount of air in the bladder. The bladder is otherwise within normal limits. 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 Otherwise, the kidneys and adrenal glands are within normal limits.  Adrenals/Urinary Tract: Adrenal glands are unremarkable. Kidneys are normal, without renal calculi, focal lesion, or hydronephrosis. Bladder is unremarkable. Stomach/Bowel: Stomach is within normal limits. Appendix appears normal. No evidence of bowel wall thickening, distention, or inflammatory changes. There is a large amount of stool in the mid and proximal colon. Vascular/Lymphatic: No significant vascular findings are present. No enlarged abdominal or pelvic lymph nodes. Reproductive: There is a 3 cm cyst in the right ovary. The left ovary and uterus are within normal limits. Other: No abdominal wall hernia or abnormality. No abdominopelvic ascites. Musculoskeletal: No acute or significant osseous findings. IMPRESSION: 1. Small amount of air in the bladder, possibly related to recent instrumentation. Correlate clinically for infection. 2. Large amount of stool in the mid and proximal colon. 3. 3 cm right ovarian simple-appearing cyst. No follow-up imaging recommended. Note: This recommendation does not apply to premenarchal patients and to those with increased risk (genetic, family history, elevated tumor markers or other high-risk factors) of ovarian cancer. Reference: JACR 2020 Feb; 17(2):248-254 4. Hepatomegaly with fatty infiltration of the liver. Electronically Signed   By: Tyron Gallon M.D.   On: 06/14/2023 17:56  DG Chest 2 View Result Date: 06/14/2023 CLINICAL DATA:  Shortness of breath. EXAM: CHEST - 2 VIEW COMPARISON:  May 07, 2023. FINDINGS: The heart size and mediastinal contours are within normal limits. Both lungs are clear. The visualized skeletal structures are unremarkable. IMPRESSION: No active cardiopulmonary disease. Electronically Signed   By: Rosalene Colon M.D.   On: 06/14/2023 13:27    Microbiology: Results for orders placed or performed during the hospital encounter of 06/14/23  MRSA Next Gen by PCR, Nasal     Status: None   Collection Time: 06/15/23  4:29 AM   Specimen:  Nasal Mucosa; Nasal Swab  Result Value Ref Range Status   MRSA by PCR Next Gen NOT DETECTED NOT DETECTED Final    Comment: (NOTE) The GeneXpert MRSA Assay (FDA approved for NASAL specimens only), is one component of a comprehensive MRSA colonization surveillance program. It is not intended to diagnose MRSA infection nor to guide or monitor treatment for MRSA infections. Test performance is not FDA approved in patients less than 78 years old. Performed at [facility], [address] [place], [place] [address]   Culture, blood (x 2)     Status: None (Preliminary result)   Collection Time: 06/15/23  7:55 AM   Specimen: Left Antecubital; Blood  Result Value Ref Range Status   Specimen Description   Final    LEFT ANTECUBITAL BOTTLES DRAWN AEROBIC ONLY Performed at [facility], [address], [place], [place] [address]    Special Requests   Final    Blood Culture results may not be optimal due to an inadequate volume of blood received in culture bottles Performed at [facility], [address] [place], [place] [address]    Culture   Final    NO GROWTH 4 DAYS Performed at [facility], [address] [place], [place] [address]    Report Status PENDING  Incomplete  Culture, blood (x 2)     Status: None (Preliminary result)   Collection Time: 06/15/23  8:12 AM   Specimen: BLOOD LEFT HAND  Result Value Ref Range Status   Specimen Description   Final    BLOOD LEFT HAND BOTTLES DRAWN AEROBIC ONLY Performed at [facility], [address], [place], [place] [address]    Special Requests   Final    Blood Culture results may not be optimal due to an inadequate volume of blood received in culture bottles Performed at [facility], [address] [place], [place] [address]    Culture   Final    NO GROWTH 4 DAYS Performed at [facility], [address], [place], [place]  [address]    Report Status PENDING  Incomplete    Labs: CBC: Recent Labs  Lab 06/14/23 1255 06/15/23 0812 06/16/23 0529 06/17/23 0318 06/18/23 0533  WBC 5.7 10.5 5.6 4.6 4.7  NEUTROABS 3.2 7.1  --   --   --   HGB 16.0* 12.6 11.8* 11.7* 11.7*  HCT 48.5* 39.9 35.3* 35.2* 36.2  MCV 115.5* 122.4* 115.7* 116.9* 118.7*  PLT 174 223 166 131* 133*   Basic Metabolic Panel: Recent Labs  Lab 06/14/23 1342 06/15/23 0812 06/16/23 0529 06/17/23 0318 06/18/23 0533 06/19/23 0512  NA 135 138 135 134* 139 137  K 3.7 3.8 3.8 3.3* 3.5 3.5  CL 98 105 103 100 103 103  CO2 24 16* 24 26 24 26   GLUCOSE 86 49*  118* 118* 125* 95  BUN 6 7 12 8  <5* <5*  CREATININE 0.60 0.74 0.80 0.53 0.47 0.46  CALCIUM 8.6* 8.0* 8.1* 8.3* 8.7* 8.8*  MG 1.8  --   --   --   --   --   PHOS 3.6  --   --   --   --   --    Liver Function Tests: Recent Labs  Lab 06/14/23 1342 06/15/23 0812 06/16/23 0529  AST 50* 58* 43*  ALT 19 15 17   ALKPHOS 96 72 66  BILITOT 3.1* 2.9* 1.6*  PROT 7.2 6.9 5.8*  ALBUMIN  3.2* 3.9 3.1*   CBG: Recent Labs  Lab 06/17/23 0814 06/17/23 2034 06/18/23 0811 06/18/23 2002 06/19/23 0725  GLUCAP 108* 131* 121* 116* 110*    Discharge time spent: less than 30 minutes.  Signed: Jerline Moon, MD [facility] 06/19/2023

## 2023-06-19 NOTE — Plan of Care (Signed)
  Problem: Education: Goal: Knowledge of General Education information will improve Description: Including pain rating scale, medication(s)/side effects and non-pharmacologic comfort measures 06/19/2023 1421 by Kerwin Peels, RN Outcome: Adequate for Discharge 06/19/2023 0753 by Kerwin Peels, RN Outcome: Progressing   Problem: Health Behavior/Discharge Planning: Goal: Ability to manage health-related needs will improve 06/19/2023 1421 by Kerwin Peels, RN Outcome: Adequate for Discharge 06/19/2023 0753 by Kerwin Peels, RN Outcome: Progressing   Problem: Clinical Measurements: Goal: Ability to maintain clinical measurements within normal limits will improve 06/19/2023 1421 by Kerwin Peels, RN Outcome: Adequate for Discharge 06/19/2023 0753 by Kerwin Peels, RN Outcome: Progressing Goal: Will remain free from infection 06/19/2023 1421 by Kerwin Peels, RN Outcome: Adequate for Discharge 06/19/2023 0753 by Kerwin Peels, RN Outcome: Progressing Goal: Diagnostic test results will improve 06/19/2023 1421 by Kerwin Peels, RN Outcome: Adequate for Discharge 06/19/2023 0753 by Kerwin Peels, RN Outcome: Progressing Goal: Respiratory complications will improve Outcome: Adequate for Discharge Goal: Cardiovascular complication will be avoided Outcome: Adequate for Discharge   Problem: Activity: Goal: Risk for activity intolerance will decrease Outcome: Adequate for Discharge   Problem: Nutrition: Goal: Adequate nutrition will be maintained Outcome: Adequate for Discharge   Problem: Coping: Goal: Level of anxiety will decrease Outcome: Adequate for Discharge   Problem: Elimination: Goal: Will not experience complications related to bowel motility Outcome: Adequate for Discharge Goal: Will not experience complications related to urinary retention Outcome: Adequate for Discharge   Problem: Pain  Managment: Goal: General experience of comfort will improve and/or be controlled Outcome: Adequate for Discharge   Problem: Safety: Goal: Ability to remain free from injury will improve Outcome: Adequate for Discharge   Problem: Skin Integrity: Goal: Risk for impaired skin integrity will decrease Outcome: Adequate for Discharge

## 2023-06-19 NOTE — Progress Notes (Signed)
 IV removed previously in day, dressed, instructions reviewed with understanding verbalized, belongings packed, transferred to front entrance via wheelchair.

## 2023-06-19 NOTE — TOC Progression Note (Signed)
 Transition of Care [facility]) - Progression Note    Patient Details  Name: Tanya Carroll MRN: [id number] Date of Birth: October 31, 1985  [facility] CM/SW Contact  Bari Leys, RN Phone Number: 06/19/2023, 11:52 AM  Clinical Narrative:  Met with patient at bedside, pt confirmed she has initiated a second level appeal, await determination. NCM provided patient with initial Appeal determination letter. TOC will continue to follow.      Barriers to Discharge: Continued Medical Work up  Expected Discharge Plan and Services         Expected Discharge Date: 06/17/23                                     Social Determinants of Health (SDOH) Interventions SDOH Screenings   Food Insecurity: No Food Insecurity (06/14/2023)  Housing: Low Risk  (06/14/2023)  Transportation Needs: No Transportation Needs (06/14/2023)  Utilities: Not At Risk (06/14/2023)  Depression (PHQ2-9): Low Risk  (09/03/2021)  Social Connections: Unknown (06/12/2021)   Received from [facility]  Tobacco Use: Low Risk  (06/14/2023)    Readmission Risk Interventions     No data to display

## 2023-06-19 NOTE — Plan of Care (Signed)

## 2023-06-20 LAB — CULTURE, BLOOD (ROUTINE X 2)
Culture: NO GROWTH
Culture: NO GROWTH

## 2023-06-26 ENCOUNTER — Other Ambulatory Visit (HOSPITAL_COMMUNITY): Payer: Self-pay

## 2023-06-27 ENCOUNTER — Ambulatory Visit (INDEPENDENT_AMBULATORY_CARE_PROVIDER_SITE_OTHER): Admitting: Gastroenterology

## 2023-06-27 ENCOUNTER — Encounter: Payer: Self-pay | Admitting: Gastroenterology

## 2023-06-27 ENCOUNTER — Other Ambulatory Visit

## 2023-06-27 VITALS — BP 118/68 | HR 100 | Ht 64.0 in | Wt 167.0 lb

## 2023-06-27 DIAGNOSIS — R935 Abnormal findings on diagnostic imaging of other abdominal regions, including retroperitoneum: Secondary | ICD-10-CM | POA: Diagnosis not present

## 2023-06-27 DIAGNOSIS — F1010 Alcohol abuse, uncomplicated: Secondary | ICD-10-CM

## 2023-06-27 DIAGNOSIS — T402X5A Adverse effect of other opioids, initial encounter: Secondary | ICD-10-CM | POA: Diagnosis not present

## 2023-06-27 DIAGNOSIS — R945 Abnormal results of liver function studies: Secondary | ICD-10-CM

## 2023-06-27 DIAGNOSIS — R109 Unspecified abdominal pain: Secondary | ICD-10-CM | POA: Diagnosis not present

## 2023-06-27 DIAGNOSIS — R112 Nausea with vomiting, unspecified: Secondary | ICD-10-CM | POA: Diagnosis not present

## 2023-06-27 DIAGNOSIS — K5903 Drug induced constipation: Secondary | ICD-10-CM

## 2023-06-27 DIAGNOSIS — R7989 Other specified abnormal findings of blood chemistry: Secondary | ICD-10-CM | POA: Diagnosis not present

## 2023-06-27 DIAGNOSIS — E538 Deficiency of other specified B group vitamins: Secondary | ICD-10-CM

## 2023-06-27 DIAGNOSIS — K7460 Unspecified cirrhosis of liver: Secondary | ICD-10-CM

## 2023-06-27 LAB — PROTIME-INR
INR: 1.3 ratio — ABNORMAL HIGH (ref 0.8–1.0)
Prothrombin Time: 13.2 s — ABNORMAL HIGH (ref 9.6–13.1)

## 2023-06-27 LAB — CBC
HCT: 39.4 % (ref 36.0–46.0)
Hemoglobin: 13.1 g/dL (ref 12.0–15.0)
MCHC: 33.2 g/dL (ref 30.0–36.0)
MCV: 113.5 fl — ABNORMAL HIGH (ref 78.0–100.0)
Platelets: 456 10*3/uL — ABNORMAL HIGH (ref 150.0–400.0)
RBC: 3.47 Mil/uL — ABNORMAL LOW (ref 3.87–5.11)
RDW: 15.4 % (ref 11.5–15.5)
WBC: 5.8 10*3/uL (ref 4.0–10.5)

## 2023-06-27 LAB — COMPREHENSIVE METABOLIC PANEL WITH GFR
ALT: 34 U/L (ref 0–35)
AST: 59 U/L — ABNORMAL HIGH (ref 0–37)
Albumin: 4.2 g/dL (ref 3.5–5.2)
Alkaline Phosphatase: 107 U/L (ref 39–117)
BUN: 5 mg/dL — ABNORMAL LOW (ref 6–23)
CO2: 27 meq/L (ref 19–32)
Calcium: 9.3 mg/dL (ref 8.4–10.5)
Chloride: 100 meq/L (ref 96–112)
Creatinine, Ser: 0.68 mg/dL (ref 0.40–1.20)
GFR: 110.21 mL/min (ref >60.00)
Glucose, Bld: 86 mg/dL (ref 70–99)
Potassium: 4 meq/L (ref 3.5–5.1)
Sodium: 139 meq/L (ref 135–145)
Total Bilirubin: 0.5 mg/dL (ref 0.2–1.2)
Total Protein: 8 g/dL (ref 6.0–8.3)

## 2023-06-27 LAB — IBC + FERRITIN
Ferritin: 695.6 ng/mL — ABNORMAL HIGH (ref 10.0–291.0)
Iron: 161 ug/dL — ABNORMAL HIGH (ref 42–145)
Saturation Ratios: 49.6 % (ref 20.0–50.0)
TIBC: 324.8 ug/dL (ref 250.0–450.0)
Transferrin: 232 mg/dL (ref 212.0–360.0)

## 2023-06-27 NOTE — Patient Instructions (Addendum)
 Your provider has requested that you go to the basement level for lab work before leaving today. Press "B" on the elevator. The lab is located at the first door on the left as you exit the elevator.  You have been scheduled for an abdominal ultrasound with elastography at [facility] (1st floor). Your appointment is scheduled for 07/04/23 at 10 am. Please arrive 30 minutes prior to your scheduled appointment for registration purposes. Make certain not to have anything to eat or drink 6 hours prior to your procedure. Should you need to reschedule your appointment, you may contact radiology at [phone].  Liver Elastography Various chronic liver diseases such as hepatitis B, C, and fatty liver disease can lead to tissue damage and subsequent scar tissue formation. As the scar tissue accumulates, the liver loses some of its elasticity and becomes stiffer. Liver elastography involves the use of a surface ultrasound probe that delivers a low frequency pulse or shear wave to a small volume of liver tissue under the rib cage. The transmission of the sound wave is completely painless. How Is a Liver Elastography Performed? The liver is located in the right upper abdomen under the rib cage. Patients are asked to lie flat on an examination table. A technician places the FibroScan probe between the ribs on the right side of the lower chest wall. A series of 10 painless pulses are then applied to the liver. The results are recorded on the equipment and an overall liver stiffness score is generated. This score is then interpreted by a qualified physician to predict the likelihood of advanced fibrosis or cirrhosis.  Patients are asked to wear loose clothing and should not consume any liquids or solids for a minimum of 4 hours before the test to increase the likelihood of obtaining reliable test results. The scan will take 10 to 15 minutes to complete, but patients should plan on being available for 30  minutes to allow time for preparation  _______________________________________________________  If your blood pressure at your visit was 140/90 or greater, please contact your primary care physician to follow up on this.  _______________________________________________________  If you are age 39 or older, your body mass index should be between 23-30. Your Body mass index is 28.67 kg/m. If this is out of the aforementioned range listed, please consider follow up with your Primary Care Provider.  If you are age 39 or younger, your body mass index should be between 19-25. Your Body mass index is 28.67 kg/m. If this is out of the aformentioned range listed, please consider follow up with your Primary Care Provider.   ________________________________________________________  The [facility] providers would like to encourage you to use MYCHART to communicate with providers for non-urgent requests or questions.  Due to long hold times on the telephone, sending your provider a message by [facility] may be a faster and more efficient way to get a response.  Please allow 48 business hours for a response.  Please remember that this is for non-urgent requests.  _______________________________________________________  Due to recent changes in healthcare laws, you may see the results of your imaging and laboratory studies on MyChart before your provider has had a chance to review them.  We understand that in some cases there may be results that are confusing or concerning to you. Not all laboratory results come back in the same time frame and the provider may be waiting for multiple results in order to interpret others.  Please give us  48 hours  in order for your provider to thoroughly review all the results before contacting the office for clarification of your results.   Thank you for entrusting me with your care and choosing [facility].  Bayley Luan Rumpf, PA-C

## 2023-06-27 NOTE — Progress Notes (Signed)
 Chief Complaint: Hospital follow up Primary GI MD: Dr. Brice Campi (hospital consult)  HPI: Discussed the use of AI scribe software for clinical note transcription with the patient, who gave verbal consent to proceed.  Recent admission 5/17-5/22/2025 for intractable abdominal pain, nausea, and vomiting as well as worsening neuropathy and oral sores. She was admitted for hypotension which improved with fluids. She is on oxycodone  for chronic pain. History of constipation. CTAP w contrast showed stool burden and ovarian cyst. Normal liver without gallstones. 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 A CT scan during this admission revealed significant constipation. She was prescribed Miralax  in powder form, which she has been taking twice daily with good effect, resulting in regular, soft bowel movements and resolution of her abdominal pain. The pain was severe enough to wake her from sleep and led to multiple days of vomiting, during which she could not keep anything down, including water.  She has a history of possible liver cirrhosis, initially suggested by an ultrasound in January. A recent CT scan did not mention cirrhosis, and her lab results are not strongly indicative of it. However, a fib-4 score indicated a moderate chance of liver stiffness or disease. She has a history of significant alcohol use, consuming approximately a fifth of liquor daily until she stopped six months ago. She experienced withdrawal symptoms, including shakes and chills, but no seizures. There is a family history of liver problems, including her biological mother and some uncles.  No current alcohol consumption and no recent withdrawal symptoms.   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 No pertinent surgical history.  Current Outpatient Medications  Medication Sig Dispense Refill   alum & mag hydroxide-simeth (MAALOX MAX) 400-400-40 MG/5ML suspension Take 15 mLs by mouth every 8 (eight) hours as needed for indigestion. 355 mL 0   dicyclomine  (BENTYL ) 20 MG tablet Take 1 tablet (20 mg total) by mouth 2 (two) times daily. 60 tablet 0   famotidine  (PEPCID ) 20 MG tablet Take 1 tablet (20 mg total) by mouth every 12 (twelve) hours as needed for heartburn or indigestion. 30 tablet 0   folic acid (FOLVITE) 1 MG tablet Take 1 tablet (1 mg total) by mouth daily.     furosemide  (LASIX ) 20 MG tablet Take 20 mg by mouth daily.     omeprazole  (PRILOSEC) 20 MG capsule Take 1 capsule (20 mg total) by mouth daily for 14 days. (Patient taking differently: Take 20 mg by mouth daily as needed (heartburn).) 14 capsule 0   ondansetron  (ZOFRAN ) 4 MG tablet Take 1 tablet (4 mg total) by mouth every 6 (six) hours as needed for nausea. 20 tablet 0   Oxycodone  HCl 10 MG TABS Take 10 mg by mouth 4 (four) times daily.     polyethylene glycol powder (GLYCOLAX /MIRALAX ) 17 GM/SCOOP powder Dissolve 17 g in liquid and take by mouth 2 (two) times daily. 238 g 0   potassium chloride  SA (KLOR-CON  M) 20 MEQ tablet Take 1 tablet (20 mEq total) by mouth daily. 30 tablet  0   promethazine  (PHENERGAN ) 12.5 MG tablet Take 1 tablet (12.5 mg total) by mouth every 6 (six) hours as needed for refractory nausea / vomiting. 30 tablet 0   pyridOXINE  (B-6) 50 MG tablet Take 1 tablet (50 mg total) by mouth daily.     senna-docusate (SENOKOT-S) 8.6-50 MG tablet Take 1 tablet by mouth 2 (two) times daily. 60 tablet 0   spironolactone  (ALDACTONE ) 25 MG tablet Take 25 mg by mouth daily.      No current facility-administered medications for this visit.    Allergies as of 06/27/2023 - Review Complete 06/27/2023  Allergen Reaction Noted   Citrus Itching and Swelling 09/15/2019   Keflex  [cephalexin ] Anaphylaxis and Shortness Of Breath 06/15/2023    Family History  Problem Relation Age of Onset   Hypertension Mother    Hypertension Sister    Colon cancer Neg Hx    Stomach cancer Neg Hx    Esophageal cancer Neg Hx     Social History   Socioeconomic History   Marital status: Single    Spouse name: Not on file   Number of children: Not on file   Years of education: Not on file   Highest education level: Not on file  Occupational History   Occupation: N/A  Tobacco Use   Smoking status: Never   Smokeless tobacco: Never  Vaping Use   Vaping status: Never Used  Substance and Sexual Activity   Alcohol use: Yes    Alcohol/week: 2.0 standard drinks of alcohol    Types: 2 Shots of liquor per week    Comment: Once or twice a week.   Drug use: No   Sexual activity: Yes    Birth control/protection: Condom  Other Topics Concern   Not on file  Social History Narrative   Not on file   Social Drivers of Health   Financial Resource Strain: Not on file  Food Insecurity: No Food Insecurity (06/14/2023)   Hunger Vital Sign    Worried About Running Out of Food in the Last Year: Never true    Ran Out of Food in the Last Year: Never true  Transportation Needs: No Transportation Needs (06/14/2023)   PRAPARE - Administrator, Civil Service (Medical): No    Lack of Transportation (Non-Medical): No  Physical Activity: Not on file  Stress: Not on file  Social Connections: Unknown (06/12/2021)   Received from [facility]   Social Network    Social Network: Not on file  Intimate Partner Violence: Not At Risk (06/14/2023)   Humiliation, Afraid, Rape, and Kick questionnaire    Fear of Current or Ex-Partner: No    Emotionally Abused: No     Physically Abused: No    Sexually Abused: No    Review of Systems:    Constitutional: No weight loss, fever, chills, weakness or fatigue HEENT: Eyes: No change in vision               Ears, Nose, Throat:  No change in hearing or congestion Skin: No rash or itching Cardiovascular: No chest pain, chest pressure or palpitations   Respiratory: No SOB or cough Gastrointestinal: See HPI and otherwise negative Genitourinary: No dysuria or change in urinary frequency Neurological: No headache, dizziness or syncope Musculoskeletal: No new muscle or joint pain Hematologic: No bleeding or bruising Psychiatric: No history of depression or anxiety    Physical Exam:  Vital signs: BP 118/68   Pulse 100   Ht 5'  4" (1.626 m)   Wt 167 lb (75.8 kg)   BMI 28.67 kg/m   Constitutional: NAD, alert and cooperative. Uses walker. Head:  Normocephalic and atraumatic. Eyes:   PEERL, EOMI. No icterus. Conjunctiva pink. Respiratory: Respirations even and unlabored. Lungs clear to auscultation bilaterally.   No wheezes, crackles, or rhonchi.  Cardiovascular:  Regular rate and rhythm. No peripheral edema, cyanosis or pallor.  Gastrointestinal:  Soft, nondistended, nontender. No rebound or guarding. Normal bowel sounds. No appreciable masses or hepatomegaly. Rectal:  Declines Msk:  Symmetrical without gross deformities. Without edema, no deformity or joint abnormality.  Neurologic:  Alert and  oriented x4;  grossly normal neurologically.  Skin:   Dry and intact without significant lesions or rashes. Psychiatric: Oriented to person, place and time. Demonstrates good judgement and reason without abnormal affect or behaviors.   RELEVANT LABS AND IMAGING: CBC    Component Value Date/Time   WBC 4.7 06/18/2023 0533   RBC 3.05 (L) 06/18/2023 0533   HGB 11.7 (L) 06/18/2023 0533   HGB 13.9 07/01/2019 1034   HCT 36.2 06/18/2023 0533   HCT 38.0 07/01/2019 1034   PLT 133 (L) 06/18/2023 0533   PLT 266  07/01/2019 1034   MCV 118.7 (H) 06/18/2023 0533   MCV 111 (H) 07/01/2019 1034   MCH 38.4 (H) 06/18/2023 0533   MCHC 32.3 06/18/2023 0533   RDW 14.5 06/18/2023 0533   RDW 12.7 07/01/2019 1034   LYMPHSABS 2.1 06/15/2023 0812   LYMPHSABS 3.3 (H) 07/01/2019 1034   MONOABS 1.2 (H) 06/15/2023 0812   EOSABS 0.0 06/15/2023 0812   EOSABS 0.0 07/01/2019 1034   BASOSABS 0.0 06/15/2023 0812   BASOSABS 0.0 07/01/2019 1034    CMP     Component Value Date/Time   NA 137 06/19/2023 0512   NA 140 07/01/2019 1034   K 3.5 06/19/2023 0512   CL 103 06/19/2023 0512   CO2 26 06/19/2023 0512   GLUCOSE 95 06/19/2023 0512   BUN <5 (L) 06/19/2023 0512   BUN 7 07/01/2019 1034   CREATININE 0.46 06/19/2023 0512   CALCIUM 8.8 (L) 06/19/2023 0512   PROT 5.8 (L) 06/16/2023 0529   PROT 6.8 07/01/2019 1034   ALBUMIN  3.1 (L) 06/16/2023 0529   ALBUMIN  3.9 07/01/2019 1034   AST 43 (H) 06/16/2023 0529   ALT 17 06/16/2023 0529   ALKPHOS 66 06/16/2023 0529   BILITOT 1.6 (H) 06/16/2023 0529   BILITOT 0.9 07/01/2019 1034   GFRNONAA >60 06/19/2023 0512   GFRAA 59 (L) 09/15/2019 1241     Assessment/Plan:   38 year old female with PMH alcohol abuse with neuropathy, possible cirrhosis, chronic pain on opioids, presents for hospital follow up  Nausea/Vomiting Abdominal pain Drug induced constipation Recent admission for intractable nausea and vomiting with hypotension thought to be cannaboid hyperemesis and worsening of constipation. Constipation secondary to chronic opioid use. CT with large stool burden Resolved with twice daily miralax  and doing much better today - Continue MiraLAX  twice daily - Continue to increase water, increase fiber, increase exercise - Advised against marijuana use  Cirrhosis (?) Alcohol abuse Has been told she has cirrhosis in the past. CT during admission with hepatomegaly and diffuse fatty infiltration, no splenomegaly, RUQ US  Jan 2025 with subtle nodularity compatible with  cirrhosis. Normal platelets. Albumin  3.1, T bili 1.6, AST 43. FIB 4 score 2.39. history of extensive alcohol abuse with 1.5 liquor daily, stopped 6 months ago (tremors but no seizures). -- serologic workup for acute and  chronic liver disease (hepatitis panel, ANA, ASMA, IgG, CBC, CMP, iron, ceruloplasmin, alpha-1 antitrypsin, PT/INR) -- liver elastography for further evaluation of fibrosis -- Extensive discussion with patient about cirrhosis and complications including ascites, hepatic encephalopathy, varices, portal hypertension - Advised complete cessation of alcohol use  Hypertension  Patient has an upcoming appointment with [facility] in August.  Discussed importance of continuity of care.  She acknowledges and agrees.  Suzanna Erp, PA-C [facility] 06/27/2023, 12:22 PM  Cc: No ref. provider found

## 2023-07-01 LAB — COPPER, SERUM: Copper: 102 ug/dL (ref 70–175)

## 2023-07-01 LAB — HEPATITIS B SURFACE ANTIBODY,QUALITATIVE: Hep B S Ab: NONREACTIVE

## 2023-07-01 LAB — ALPHA-1-ANTITRYPSIN: A-1 Antitrypsin, Ser: 125 mg/dL (ref 83–199)

## 2023-07-01 LAB — CERULOPLASMIN: Ceruloplasmin: 24 mg/dL (ref 14–48)

## 2023-07-01 LAB — MITOCHONDRIAL ANTIBODIES: Mitochondrial M2 Ab, IgG: <=20 U (ref <=20.0)

## 2023-07-01 LAB — HEPATITIS A ANTIBODY, TOTAL: Hepatitis A AB,Total: REACTIVE — AB

## 2023-07-01 LAB — IGA: Immunoglobulin A: 359 mg/dL — ABNORMAL HIGH (ref 47–310)

## 2023-07-01 LAB — ANA: Anti Nuclear Antibody (ANA): NEGATIVE

## 2023-07-01 LAB — ANTI-DNA ANTIBODY, DOUBLE-STRANDED: ds DNA Ab: <1 [IU]/mL

## 2023-07-01 LAB — ANTI-SMOOTH MUSCLE ANTIBODY, IGG: Actin (Smooth Muscle) Antibody (IGG): <20 U (ref <20)

## 2023-07-01 LAB — IGG: IgG (Immunoglobin G), Serum: 1874 mg/dL — ABNORMAL HIGH (ref 600–1640)

## 2023-07-01 LAB — HEPATITIS C ANTIBODY: Hepatitis C Ab: NONREACTIVE

## 2023-07-01 LAB — HEPATITIS B SURFACE ANTIGEN: Hepatitis B Surface Ag: NONREACTIVE

## 2023-07-01 NOTE — Progress Notes (Signed)
 Attending Physician's Attestation   I have reviewed the chart.   I agree with the Advanced Practitioner's note, impression, and recommendations with any updates as below. Agree with workup as outlined and elastography to be performed. If patient follows up with [facility], as it seems is their intention in August, she will be released from the [facility] practice.   Yong Henle, MD [facility] Advanced Endoscopy Office # [phone]

## 2023-07-04 ENCOUNTER — Ambulatory Visit (HOSPITAL_COMMUNITY)
Admission: RE | Admit: 2023-07-04 | Discharge: 2023-07-04 | Disposition: A | Discharge status: Home / Self Care | Source: Ambulatory Visit | Attending: Gastroenterology | Admitting: Gastroenterology

## 2023-07-04 DIAGNOSIS — R935 Abnormal findings on diagnostic imaging of other abdominal regions, including retroperitoneum: Secondary | ICD-10-CM | POA: Diagnosis not present

## 2023-07-08 ENCOUNTER — Ambulatory Visit: Payer: Self-pay | Admitting: Gastroenterology

## 2023-07-19 ENCOUNTER — Telehealth: Admitting: Physician Assistant

## 2023-07-19 DIAGNOSIS — R002 Palpitations: Secondary | ICD-10-CM

## 2023-07-19 NOTE — Patient Instructions (Signed)
 Tanya Carroll, thank you for joining [name], PA-C for today's virtual visit.  While this provider is not your primary care provider (PCP), if your PCP is located in our provider database this encounter information will be shared with them immediately following your visit.   A [facility] MyChart account gives you access to today's visit and all your visits, tests, and labs performed at [facility]  click here if you don't have a [facility] MyChart account or go to [email or web address]  Consent: (Patient) Tanya Carroll provided verbal consent for this virtual visit at the beginning of the encounter.  Current Medications:  Current Outpatient Medications:    alum & mag hydroxide-simeth (MAALOX MAX) 400-400-40 MG/5ML suspension, Take 15 mLs by mouth every 8 (eight) hours as needed for indigestion., Disp: 355 mL, Rfl: 0   dicyclomine  (BENTYL ) 20 MG tablet, Take 1 tablet (20 mg total) by mouth 2 (two) times daily., Disp: 60 tablet, Rfl: 0   famotidine  (PEPCID ) 20 MG tablet, Take 1 tablet (20 mg total) by mouth every 12 (twelve) hours as needed for heartburn or indigestion., Disp: 30 tablet, Rfl: 0   folic acid  (FOLVITE ) 1 MG tablet, Take 1 tablet (1 mg total) by mouth daily., Disp: , Rfl:    furosemide  (LASIX ) 20 MG tablet, Take 20 mg by mouth daily., Disp: , Rfl:    omeprazole  (PRILOSEC) 20 MG capsule, Take 1 capsule (20 mg total) by mouth daily for 14 days. (Patient taking differently: Take 20 mg by mouth daily as needed (heartburn).), Disp: 14 capsule, Rfl: 0   ondansetron  (ZOFRAN ) 4 MG tablet, Take 1 tablet (4 mg total) by mouth every 6 (six) hours as needed for nausea., Disp: 20 tablet, Rfl: 0   Oxycodone  HCl 10 MG TABS, Take 10 mg by mouth 4 (four) times daily., Disp: , Rfl:    polyethylene glycol powder (GLYCOLAX /MIRALAX ) 17 GM/SCOOP powder, Dissolve 17 g in liquid and take by mouth 2 (two) times daily., Disp: 238 g, Rfl: 0   potassium chloride  SA (KLOR-CON   M) 20 MEQ tablet, Take 1 tablet (20 mEq total) by mouth daily., Disp: 30 tablet, Rfl: 0   promethazine  (PHENERGAN ) 12.5 MG tablet, Take 1 tablet (12.5 mg total) by mouth every 6 (six) hours as needed for refractory nausea / vomiting., Disp: 30 tablet, Rfl: 0   pyridOXINE  (B-6) 50 MG tablet, Take 1 tablet (50 mg total) by mouth daily., Disp: , Rfl:    senna-docusate (SENOKOT-S) 8.6-50 MG tablet, Take 1 tablet by mouth 2 (two) times daily., Disp: 60 tablet, Rfl: 0   spironolactone  (ALDACTONE ) 25 MG tablet, Take 25 mg by mouth daily., Disp: , Rfl:    Medications ordered in this encounter:  No orders of the defined types were placed in this encounter.    *If you need refills on other medications prior to your next appointment, please contact your pharmacy*  Follow-Up: Call back or seek an in-person evaluation if the symptoms worsen or if the condition fails to improve as anticipated.  [facility] [phone]  Other Instructions Report to ER for evaluation.    If you have been instructed to have an in-person evaluation today at a local Urgent Care facility, please use the link below. It will take you to a list of all of our available [facility], including address, phone number and hours of operation. Please do not delay care.  [facility]  If you or a family member do not  have a primary care provider, use the link below to schedule a visit and establish care. When you choose a [facility] primary care physician or advanced practice provider, you gain a long-term partner in health. Find a Primary Care Provider  Learn more about [facility] in-office and virtual care options: [facility] - Get Care Now

## 2023-07-19 NOTE — Progress Notes (Signed)
 Virtual Visit Consent   Tanya Carroll, you are scheduled for a virtual visit with a [facility] provider today. Just as with appointments in the office, your consent must be obtained to participate. Your consent will be active for this visit and any virtual visit you may have with one of our providers in the next 365 days. If you have a MyChart account, a copy of this consent can be sent to you electronically.  As this is a virtual visit, video technology does not allow for your provider to perform a traditional examination. This may limit your provider's ability to fully assess your condition. If your provider identifies any concerns that need to be evaluated in person or the need to arrange testing (such as labs, EKG, etc.), we will make arrangements to do so. Although advances in technology are sophisticated, we cannot ensure that it will always work on either your end or our end. If the connection with a video visit is poor, the visit may have to be switched to a telephone visit. With either a video or telephone visit, we are not always able to ensure that we have a secure connection.  By engaging in this virtual visit, you consent to the provision of healthcare and authorize for your insurance to be billed (if applicable) for the services provided during this visit. Depending on your insurance coverage, you may receive a charge related to this service.  I need to obtain your verbal consent now. Are you willing to proceed with your visit today? Tanya Carroll has provided verbal consent on 07/19/2023 for a virtual visit (video or telephone). Tanya Carroll, [place]  Date: 07/19/2023 4:49 PM   Virtual Visit via Video Note   I, Tanya Carroll, connected with  Tanya Carroll  ([id number], [birth year]) on 07/19/23 at  5:00 PM EDT by a video-enabled telemedicine application and verified that I am speaking with the correct person using two identifiers.  Location: Patient: Virtual Visit Location  Patient: Home Provider: Virtual Visit Location Provider: Home Office   I discussed the limitations of evaluation and management by telemedicine and the availability of in person appointments. The patient expressed understanding and agreed to proceed.    History of Present Illness: Tanya Carroll is a 38 y.o. who identifies as a female who was assigned female at birth, and is being seen today for palpitations.  HPI: Palpitations  The current episode started today. The problem occurs constantly. The problem has been unchanged. Associated symptoms include chest pain, dizziness and near-syncope. She has tried nothing for the symptoms.    Problems:  Patient Active Problem List   Diagnosis Date Noted   Abdominal pain with vomiting 06/14/2023   Cirrhosis of liver (HCC) 06/14/2023   Constipation 06/14/2023   Chest pain 06/14/2023   Neuropathy    B12 deficiency 07/20/2020   Idiopathic peripheral neuropathy 07/20/2020   Abnormal MRI of head 07/20/2020   Hypertension 07/20/2020   BMI 26.0-26.9,adult 07/20/2020    Allergies:  Allergies  Allergen Reactions   Citrus Itching and Swelling   Keflex  [Cephalexin ] Anaphylaxis and Shortness Of Breath   Medications:  Current Outpatient Medications:    alum & mag hydroxide-simeth (MAALOX MAX) 400-400-40 MG/5ML suspension, Take 15 mLs by mouth every 8 (eight) hours as needed for indigestion., Disp: 355 mL, Rfl: 0   dicyclomine  (BENTYL ) 20 MG tablet, Take 1 tablet (20 mg total) by mouth 2 (two) times daily., Disp: 60 tablet, Rfl: 0   famotidine  (PEPCID ) 20 MG tablet, Take 1 tablet (  20 mg total) by mouth every 12 (twelve) hours as needed for heartburn or indigestion., Disp: 30 tablet, Rfl: 0   folic acid  (FOLVITE ) 1 MG tablet, Take 1 tablet (1 mg total) by mouth daily., Disp: , Rfl:    furosemide  (LASIX ) 20 MG tablet, Take 20 mg by mouth daily., Disp: , Rfl:    omeprazole  (PRILOSEC) 20 MG capsule, Take 1 capsule (20 mg total) by mouth daily for 14  days. (Patient taking differently: Take 20 mg by mouth daily as needed (heartburn).), Disp: 14 capsule, Rfl: 0   ondansetron  (ZOFRAN ) 4 MG tablet, Take 1 tablet (4 mg total) by mouth every 6 (six) hours as needed for nausea., Disp: 20 tablet, Rfl: 0   Oxycodone  HCl 10 MG TABS, Take 10 mg by mouth 4 (four) times daily., Disp: , Rfl:    polyethylene glycol powder (GLYCOLAX /MIRALAX ) 17 GM/SCOOP powder, Dissolve 17 g in liquid and take by mouth 2 (two) times daily., Disp: 238 g, Rfl: 0   potassium chloride  SA (KLOR-CON  M) 20 MEQ tablet, Take 1 tablet (20 mEq total) by mouth daily., Disp: 30 tablet, Rfl: 0   promethazine  (PHENERGAN ) 12.5 MG tablet, Take 1 tablet (12.5 mg total) by mouth every 6 (six) hours as needed for refractory nausea / vomiting., Disp: 30 tablet, Rfl: 0   pyridOXINE  (B-6) 50 MG tablet, Take 1 tablet (50 mg total) by mouth daily., Disp: , Rfl:    senna-docusate (SENOKOT-S) 8.6-50 MG tablet, Take 1 tablet by mouth 2 (two) times daily., Disp: 60 tablet, Rfl: 0   spironolactone  (ALDACTONE ) 25 MG tablet, Take 25 mg by mouth daily., Disp: , Rfl:   Observations/Objective: Patient is well-developed, well-nourished in no acute distress.  Resting comfortably  at home.  Head is normocephalic, atraumatic.  No labored breathing.  Speech is clear and coherent with logical content.  Patient is alert and oriented at baseline.    Assessment and Plan: 1. Palpitations (Primary)  Patient presenting with sudden onset of chest pain, pre syncope and shortness of breath. Denies vision changes, nausea, vomiting. Advised to report to nearest ER for evaluation.   Follow Up Instructions: I discussed the assessment and treatment plan with the patient. The patient was provided an opportunity to ask questions and all were answered. The patient agreed with the plan and demonstrated an understanding of the instructions.  A copy of instructions were sent to the patient via MyChart unless otherwise noted  below.    The patient was advised to call back or seek an in-person evaluation if the symptoms worsen or if the condition fails to improve as anticipated.    Tanya Shuck, PA-C

## 2024-01-30 ENCOUNTER — Inpatient Hospital Stay (HOSPITAL_COMMUNITY)
Admission: EM | Admit: 2024-01-30 | Discharge: 2024-02-02 | DRG: 439 | Disposition: A | Discharge status: Home / Self Care | Attending: Internal Medicine | Admitting: Internal Medicine

## 2024-01-30 DIAGNOSIS — E538 Deficiency of other specified B group vitamins: Secondary | ICD-10-CM | POA: Diagnosis present

## 2024-01-30 DIAGNOSIS — G629 Polyneuropathy, unspecified: Secondary | ICD-10-CM | POA: Diagnosis present

## 2024-01-30 DIAGNOSIS — K852 Alcohol induced acute pancreatitis without necrosis or infection: Principal | ICD-10-CM | POA: Diagnosis present

## 2024-01-30 DIAGNOSIS — Z91018 Allergy to other foods: Secondary | ICD-10-CM

## 2024-01-30 DIAGNOSIS — F1010 Alcohol abuse, uncomplicated: Secondary | ICD-10-CM | POA: Diagnosis present

## 2024-01-30 DIAGNOSIS — I10 Essential (primary) hypertension: Secondary | ICD-10-CM | POA: Diagnosis present

## 2024-01-30 DIAGNOSIS — N390 Urinary tract infection, site not specified: Secondary | ICD-10-CM | POA: Diagnosis present

## 2024-01-30 DIAGNOSIS — R000 Tachycardia, unspecified: Secondary | ICD-10-CM | POA: Diagnosis present

## 2024-01-30 DIAGNOSIS — K7460 Unspecified cirrhosis of liver: Secondary | ICD-10-CM | POA: Diagnosis present

## 2024-01-30 DIAGNOSIS — D7589 Other specified diseases of blood and blood-forming organs: Secondary | ICD-10-CM | POA: Diagnosis present

## 2024-01-30 DIAGNOSIS — Z888 Allergy status to other drugs, medicaments and biological substances status: Secondary | ICD-10-CM

## 2024-01-30 DIAGNOSIS — K7030 Alcoholic cirrhosis of liver without ascites: Secondary | ICD-10-CM | POA: Diagnosis present

## 2024-01-30 DIAGNOSIS — Z79899 Other long term (current) drug therapy: Secondary | ICD-10-CM

## 2024-01-30 DIAGNOSIS — Z8249 Family history of ischemic heart disease and other diseases of the circulatory system: Secondary | ICD-10-CM

## 2024-01-30 LAB — COMPREHENSIVE METABOLIC PANEL WITH GFR
ALT: 63 U/L — ABNORMAL HIGH (ref 0–44)
AST: 219 U/L — ABNORMAL HIGH (ref 15–41)
Albumin: 4.2 g/dL (ref 3.5–5.0)
Alkaline Phosphatase: 155 U/L — ABNORMAL HIGH (ref 38–126)
Anion gap: 14 (ref 5–15)
BUN: 6 mg/dL (ref 6–20)
CO2: 25 mmol/L (ref 22–32)
Calcium: 9.3 mg/dL (ref 8.9–10.3)
Chloride: 97 mmol/L — ABNORMAL LOW (ref 98–111)
Creatinine, Ser: 0.58 mg/dL (ref 0.44–1.00)
GFR, Estimated: >60 mL/min
Glucose, Bld: 130 mg/dL — ABNORMAL HIGH (ref 70–99)
Potassium: 3.7 mmol/L (ref 3.5–5.1)
Sodium: 137 mmol/L (ref 135–145)
Total Bilirubin: 1.3 mg/dL — ABNORMAL HIGH (ref 0.0–1.2)
Total Protein: 8.6 g/dL — ABNORMAL HIGH (ref 6.5–8.1)

## 2024-01-30 LAB — CBC
HCT: 52.4 % — ABNORMAL HIGH (ref 36.0–46.0)
Hemoglobin: 17.7 g/dL — ABNORMAL HIGH (ref 12.0–15.0)
MCH: 36.6 pg — ABNORMAL HIGH (ref 26.0–34.0)
MCHC: 33.8 g/dL (ref 30.0–36.0)
MCV: 108.5 fL — ABNORMAL HIGH (ref 80.0–100.0)
Platelets: 137 K/uL — ABNORMAL LOW (ref 150–400)
RBC: 4.83 MIL/uL (ref 3.87–5.11)
RDW: 13.1 % (ref 11.5–15.5)
WBC: 7.2 K/uL (ref 4.0–10.5)
nRBC: 0 % (ref 0.0–0.2)

## 2024-01-30 LAB — URINALYSIS, ROUTINE W REFLEX MICROSCOPIC
Glucose, UA: NEGATIVE mg/dL
Hgb urine dipstick: NEGATIVE
Ketones, ur: NEGATIVE mg/dL
Nitrite: NEGATIVE
Protein, ur: NEGATIVE mg/dL
Specific Gravity, Urine: 1.024 (ref 1.005–1.030)
pH: 5 (ref 5.0–8.0)

## 2024-01-30 LAB — APTT: aPTT: 28 s (ref 24–36)

## 2024-01-30 LAB — PROTIME-INR
INR: 1.2 (ref 0.8–1.2)
Prothrombin Time: 15.6 s — ABNORMAL HIGH (ref 11.4–15.2)

## 2024-01-30 LAB — LIPASE, BLOOD: Lipase: 1791 U/L — ABNORMAL HIGH (ref 11–51)

## 2024-01-30 MED ORDER — SODIUM CHLORIDE 0.9 % IV BOLUS
1000.0000 mL | Freq: Once | INTRAVENOUS | Status: AC
  Administered 2024-01-30: 1000 mL via INTRAVENOUS

## 2024-01-30 MED ORDER — HYDROMORPHONE HCL 1 MG/ML IJ SOLN
1.0000 mg | Freq: Once | INTRAMUSCULAR | Status: AC
  Administered 2024-01-30: 1 mg via INTRAVENOUS
  Filled 2024-01-30: qty 1

## 2024-01-30 MED ORDER — ONDANSETRON HCL 4 MG/2ML IJ SOLN
4.0000 mg | Freq: Once | INTRAMUSCULAR | Status: AC
  Administered 2024-01-30: 4 mg via INTRAVENOUS
  Filled 2024-01-30: qty 2

## 2024-01-30 NOTE — Progress Notes (Signed)
 ED provider able to establish USGPIV

## 2024-01-30 NOTE — ED Provider Notes (Signed)
 " [facility] Provider Note   CSN: [id number] Arrival date & time: 01/30/24  1811     Patient presents with: Abdominal Pain   Tanya Carroll is a 39 y.o. female.   The history is provided by the patient and medical records.  Abdominal Pain Associated symptoms: nausea    39 year old female with history of cirrhosis, hypertension, neuropathy, presenting to the ED with abdominal pain.  States she woke up with epigastric pain this morning.  Did notice a little bit of pain in her back and has since started to radiate throughout the abdomen.  She reports a lot of nausea but has not had any active emesis.  No fever or chills.  Tried drinking water but pain was getting worse.  She has not had any solid food intake today.  Does have history of cirrhosis, follows closely with [facility].  On last ultrasound she was told her cirrhosis was actually improving.  She has been abstinent from alcohol for several months, did drink a little for new years.  Prior to Admission medications  Medication Sig Start Date End Date Taking? Authorizing Provider  alum & mag hydroxide-simeth (MAALOX MAX) 400-400-40 MG/5ML suspension Take 15 mLs by mouth every 8 (eight) hours as needed for indigestion. 05/21/23   Elnor Bernarda SQUIBB, DO  dicyclomine  (BENTYL ) 20 MG tablet Take 1 tablet (20 mg total) by mouth 2 (two) times daily. 06/17/23   Lue Elsie BROCKS, MD  famotidine  (PEPCID ) 20 MG tablet Take 1 tablet (20 mg total) by mouth every 12 (twelve) hours as needed for heartburn or indigestion. 05/21/23   Elnor Bernarda P, DO  folic acid  (FOLVITE ) 1 MG tablet Take 1 tablet (1 mg total) by mouth daily. 06/20/23   Jadine Toribio SQUIBB, MD  furosemide  (LASIX ) 20 MG tablet Take 20 mg by mouth daily. 09/02/21   [provider]  omeprazole  (PRILOSEC) 20 MG capsule Take 1 capsule (20 mg total) by mouth daily for 14 days. Patient taking differently: Take 20 mg by mouth daily as needed  (heartburn). 01/31/23 06/13/24  Smoot, Lauraine LABOR, PA-C  ondansetron  (ZOFRAN ) 4 MG tablet Take 1 tablet (4 mg total) by mouth every 6 (six) hours as needed for nausea. 06/17/23   Lue Elsie BROCKS, MD  Oxycodone  HCl 10 MG TABS Take 10 mg by mouth 4 (four) times daily. 08/08/21   [provider]  polyethylene glycol powder (GLYCOLAX /MIRALAX ) 17 GM/SCOOP powder Dissolve 17 g in liquid and take by mouth 2 (two) times daily. 06/17/23   Lue Elsie BROCKS, MD  potassium chloride  SA (KLOR-CON  M) 20 MEQ tablet Take 1 tablet (20 mEq total) by mouth daily. 06/17/23   Lue Elsie BROCKS, MD  promethazine  (PHENERGAN ) 12.5 MG tablet Take 1 tablet (12.5 mg total) by mouth every 6 (six) hours as needed for refractory nausea / vomiting. 06/17/23   Lue Elsie BROCKS, MD  pyridOXINE  (B-6) 50 MG tablet Take 1 tablet (50 mg total) by mouth daily. 06/20/23   Jadine Toribio SQUIBB, MD  senna-docusate (SENOKOT-S) 8.6-50 MG tablet Take 1 tablet by mouth 2 (two) times daily. 06/17/23   Lue Elsie BROCKS, MD  spironolactone  (ALDACTONE ) 25 MG tablet Take 25 mg by mouth daily.    [provider]    Allergies: Citrus and Keflex  [cephalexin ]    Review of Systems  Gastrointestinal:  Positive for abdominal pain and nausea.  All other systems reviewed and are negative.   Updated Vital Signs BP (!) 133/91 (  BP Location: Left Arm)   Pulse (!) 117   Temp 98.4 F (36.9 C) (Oral)   Resp 16   SpO2 99%   Physical Exam Vitals and nursing note reviewed.  Constitutional:      Appearance: She is well-developed.  HENT:     Head: Normocephalic and atraumatic.  Eyes:     Conjunctiva/sclera: Conjunctivae normal.     Pupils: Pupils are equal, round, and reactive to light.  Cardiovascular:     Rate and Rhythm: Normal rate and regular rhythm.     Heart sounds: Normal heart sounds.  Pulmonary:     Effort: Pulmonary effort is normal.     Breath sounds: Normal breath sounds.  Abdominal:     General: Bowel sounds  are normal.     Palpations: Abdomen is soft.     Tenderness: There is abdominal tenderness in the epigastric area.     Comments: Tenderness throughout but worse epigastric area, no peritoneal signs  Musculoskeletal:        General: Normal range of motion.     Cervical back: Normal range of motion.  Skin:    General: Skin is warm and dry.  Neurological:     Mental Status: She is alert and oriented to person, place, and time.     (all labs ordered are listed, but only abnormal results are displayed) Labs Reviewed  LIPASE, BLOOD - Abnormal; Notable for the following components:      Result Value   Lipase 1,791 (*)    All other components within normal limits  COMPREHENSIVE METABOLIC PANEL WITH GFR - Abnormal; Notable for the following components:   Chloride 97 (*)    Glucose, Bld 130 (*)    Total Protein 8.6 (*)    AST 219 (*)    ALT 63 (*)    Alkaline Phosphatase 155 (*)    Total Bilirubin 1.3 (*)    All other components within normal limits  CBC - Abnormal; Notable for the following components:   Hemoglobin 17.7 (*)    HCT 52.4 (*)    MCV 108.5 (*)    MCH 36.6 (*)    Platelets 137 (*)    All other components within normal limits  URINALYSIS, ROUTINE W REFLEX MICROSCOPIC - Abnormal; Notable for the following components:   Color, Urine AMBER (*)    APPearance CLOUDY (*)    Bilirubin Urine SMALL (*)    Leukocytes,Ua TRACE (*)    Bacteria, UA MANY (*)    All other components within normal limits  PROTIME-INR - Abnormal; Notable for the following components:   Prothrombin Time 15.6 (*)    All other components within normal limits  URINE CULTURE  PREGNANCY, URINE  APTT    EKG: None  Radiology: CT ABDOMEN PELVIS W CONTRAST Result Date: 01/31/2024 EXAM: CT ABDOMEN AND PELVIS WITH CONTRAST 01/31/2024 12:30:18 AM TECHNIQUE: CT of the abdomen and pelvis was performed with the administration of 100 mL of iohexol  (OMNIPAQUE ) 300 MG/ML solution. Multiplanar reformatted images  are provided for review. Automated exposure control, iterative reconstruction, and/or weight-based adjustment of the mA/kV was utilized to reduce the radiation dose to as low as reasonably achievable. COMPARISON: 06/14/2023. CLINICAL HISTORY: Pancreatitis, acute, initial episode (Ped 0-17y); hx cirrhosis, lipase 1800. FINDINGS: LOWER CHEST: Bibasilar atelectasis. LIVER: Diffuse low density throughout the liver, compatible with fatty infiltration. GALLBLADDER AND BILE DUCTS: Gallbladder is unremarkable. No biliary ductal dilatation. SPLEEN: No acute abnormality. PANCREAS: Edema surrounding the pancreas with fluid tracking in  the anterior pararenal spaces bilaterally as well as in the transverse mesocolon compatible with acute pancreatitis. ADRENAL GLANDS: No acute abnormality. KIDNEYS, URETERS AND BLADDER: No stones in the kidneys or ureters. No hydronephrosis. No perinephric or periureteral stranding. Urinary bladder is unremarkable. GI AND BOWEL: Stomach demonstrates no acute abnormality. Large stool burden throughout the colon. There is no bowel obstruction. PERITONEUM AND RETROPERITONEUM: Small amount of perihepatic ascites. No free air. VASCULATURE: Aorta is normal in caliber. LYMPH NODES: No lymphadenopathy. REPRODUCTIVE ORGANS: 3 cm right ovarian cyst. Uterus and left ovary unremarkable. BONES AND SOFT TISSUES: No acute osseous abnormality. No focal soft tissue abnormality. IMPRESSION: 1. Acute pancreatitis changes. 2. Fatty infiltration of the liver. 3. Small amount of perihepatic ascites. Electronically signed by: Franky Crease MD 01/31/2024 12:35 AM EST RP Workstation: [id number]     Procedures   Medications Ordered in the ED  sodium chloride  0.9 % bolus 1,000 mL (1,000 mLs Intravenous New Bag/Given 01/30/24 2236)  HYDROmorphone  (DILAUDID ) injection 1 mg (1 mg Intravenous Given 01/30/24 2236)  ondansetron  (ZOFRAN ) injection 4 mg (4 mg Intravenous Given 01/30/24 2237)  iohexol  (OMNIPAQUE ) 300 MG/ML  solution 100 mL (100 mLs Intravenous Contrast Given 01/31/24 0024)                                    Medical Decision Making Amount and/or Complexity of Data Reviewed Labs: ordered. Radiology: ordered and independent interpretation performed. ECG/medicine tests: ordered and independent interpretation performed.  Risk Prescription drug management. Decision regarding hospitalization.   39 year old female presenting to the ED with epigastric abdominal pain.  History of cirrhosis and prior alcohol abuse.  She has been abstinent from alcohol for the past several months but did drink over the new year holiday.  Has had nausea but denies any vomiting.  She is afebrile and nontoxic in appearance here.  Tenderness throughout abdomen but notably more in the epigastric area though she is not peritoneal.  Labs obtained from triage without leukocytosis.  Does have elevated LFTs and alk phos, appears to be consistent with an alcohol pattern with 2:1 ratio.  Bili is 1.3.  lipase 1791.  I suspect this is alcohol induced pancreatitis.  Will initiate IVF, analgesia and antiemetics.  CT with findings of pancreatitis but no acute complications.  Will admit for ongoing care.  Discussed with hospitalist, Dr. Dena-- will admit for ongoing care.  Final diagnoses:  Alcohol-induced acute pancreatitis, unspecified complication status    ED Discharge Orders     None          Jarold Olam HERO, PA-C 01/31/24 0057  "

## 2024-01-30 NOTE — ED Triage Notes (Signed)
 BIB EMS from home for epigastric pain spreading throughout her abdomen. Hx of cirrhosis. Last time she felt this way she had a an abdominal infection. No N/V/D. Some SOB due to pain.

## 2024-01-30 NOTE — ED Notes (Signed)
 Attempted lab x 2 but unsuccessful, RN made aware.

## 2024-01-31 ENCOUNTER — Emergency Department (HOSPITAL_COMMUNITY)

## 2024-01-31 ENCOUNTER — Other Ambulatory Visit: Payer: Self-pay

## 2024-01-31 ENCOUNTER — Encounter (HOSPITAL_COMMUNITY): Payer: Self-pay | Admitting: Internal Medicine

## 2024-01-31 DIAGNOSIS — Z888 Allergy status to other drugs, medicaments and biological substances status: Secondary | ICD-10-CM | POA: Diagnosis not present

## 2024-01-31 DIAGNOSIS — K852 Alcohol induced acute pancreatitis without necrosis or infection: Secondary | ICD-10-CM | POA: Diagnosis present

## 2024-01-31 DIAGNOSIS — K7030 Alcoholic cirrhosis of liver without ascites: Secondary | ICD-10-CM | POA: Diagnosis present

## 2024-01-31 DIAGNOSIS — F1010 Alcohol abuse, uncomplicated: Secondary | ICD-10-CM | POA: Diagnosis present

## 2024-01-31 DIAGNOSIS — G629 Polyneuropathy, unspecified: Secondary | ICD-10-CM | POA: Diagnosis present

## 2024-01-31 DIAGNOSIS — Z79899 Other long term (current) drug therapy: Secondary | ICD-10-CM | POA: Diagnosis not present

## 2024-01-31 DIAGNOSIS — R000 Tachycardia, unspecified: Secondary | ICD-10-CM | POA: Diagnosis present

## 2024-01-31 DIAGNOSIS — Z8249 Family history of ischemic heart disease and other diseases of the circulatory system: Secondary | ICD-10-CM | POA: Diagnosis not present

## 2024-01-31 DIAGNOSIS — D7589 Other specified diseases of blood and blood-forming organs: Secondary | ICD-10-CM | POA: Diagnosis present

## 2024-01-31 DIAGNOSIS — Z91018 Allergy to other foods: Secondary | ICD-10-CM | POA: Diagnosis not present

## 2024-01-31 DIAGNOSIS — I10 Essential (primary) hypertension: Secondary | ICD-10-CM | POA: Diagnosis present

## 2024-01-31 DIAGNOSIS — N390 Urinary tract infection, site not specified: Secondary | ICD-10-CM | POA: Diagnosis present

## 2024-01-31 DIAGNOSIS — E538 Deficiency of other specified B group vitamins: Secondary | ICD-10-CM | POA: Diagnosis present

## 2024-01-31 LAB — COMPREHENSIVE METABOLIC PANEL WITH GFR
ALT: 46 U/L — ABNORMAL HIGH (ref 0–44)
AST: 146 U/L — ABNORMAL HIGH (ref 15–41)
Albumin: 3.5 g/dL (ref 3.5–5.0)
Alkaline Phosphatase: 132 U/L — ABNORMAL HIGH (ref 38–126)
Anion gap: 13 (ref 5–15)
BUN: 5 mg/dL — ABNORMAL LOW (ref 6–20)
CO2: 25 mmol/L (ref 22–32)
Calcium: 8.4 mg/dL — ABNORMAL LOW (ref 8.9–10.3)
Chloride: 99 mmol/L (ref 98–111)
Creatinine, Ser: 0.5 mg/dL (ref 0.44–1.00)
GFR, Estimated: >60 mL/min
Glucose, Bld: 98 mg/dL (ref 70–99)
Potassium: 3.7 mmol/L (ref 3.5–5.1)
Sodium: 137 mmol/L (ref 135–145)
Total Bilirubin: 1.5 mg/dL — ABNORMAL HIGH (ref 0.0–1.2)
Total Protein: 6.9 g/dL (ref 6.5–8.1)

## 2024-01-31 LAB — PREGNANCY, URINE: Preg Test, Ur: NEGATIVE

## 2024-01-31 LAB — CBC
HCT: 43.9 % (ref 36.0–46.0)
Hemoglobin: 14.8 g/dL (ref 12.0–15.0)
MCH: 36.3 pg — ABNORMAL HIGH (ref 26.0–34.0)
MCHC: 33.7 g/dL (ref 30.0–36.0)
MCV: 107.6 fL — ABNORMAL HIGH (ref 80.0–100.0)
Platelets: 168 K/uL (ref 150–400)
RBC: 4.08 MIL/uL (ref 3.87–5.11)
RDW: 13 % (ref 11.5–15.5)
WBC: 7 K/uL (ref 4.0–10.5)
nRBC: 0 % (ref 0.0–0.2)

## 2024-01-31 MED ORDER — OXYCODONE HCL 5 MG PO TABS
10.0000 mg | ORAL_TABLET | Freq: Four times a day (QID) | ORAL | Status: DC
  Administered 2024-01-31 – 2024-02-01 (×5): 10 mg via ORAL
  Filled 2024-01-31 (×5): qty 2

## 2024-01-31 MED ORDER — ONDANSETRON HCL 4 MG PO TABS
4.0000 mg | ORAL_TABLET | Freq: Four times a day (QID) | ORAL | Status: DC | PRN
  Administered 2024-02-01 – 2024-02-02 (×2): 4 mg via ORAL
  Filled 2024-01-31 (×2): qty 1

## 2024-01-31 MED ORDER — LACTULOSE 10 GM/15ML PO SOLN: 10.0000 g | Freq: Three times a day (TID) | ORAL | Status: DC | PRN

## 2024-01-31 MED ORDER — PANTOPRAZOLE SODIUM 40 MG PO TBEC: 40.0000 mg | DELAYED_RELEASE_TABLET | Freq: Every day | ORAL | Status: DC | PRN

## 2024-01-31 MED ORDER — ONDANSETRON HCL 4 MG/2ML IJ SOLN: 4.0000 mg | Freq: Four times a day (QID) | INTRAMUSCULAR | Status: DC | PRN

## 2024-01-31 MED ORDER — FOLIC ACID 1 MG PO TABS
1.0000 mg | ORAL_TABLET | Freq: Every day | ORAL | Status: DC
  Administered 2024-01-31 – 2024-02-02 (×3): 1 mg via ORAL
  Filled 2024-01-31 (×3): qty 1

## 2024-01-31 MED ORDER — ACETAMINOPHEN 650 MG RE SUPP: 650.0000 mg | Freq: Four times a day (QID) | RECTAL | Status: DC | PRN

## 2024-01-31 MED ORDER — LACTATED RINGERS IV SOLN: INTRAVENOUS | Status: DC

## 2024-01-31 MED ORDER — LACTATED RINGERS IV BOLUS
1000.0000 mL | Freq: Once | INTRAVENOUS | Status: AC
  Administered 2024-01-31: 1000 mL via INTRAVENOUS

## 2024-01-31 MED ORDER — DIPHENHYDRAMINE HCL 50 MG/ML IJ SOLN: 25.0000 mg | Freq: Four times a day (QID) | INTRAMUSCULAR | Status: DC | PRN

## 2024-01-31 MED ORDER — IOHEXOL 300 MG/ML  SOLN
100.0000 mL | Freq: Once | INTRAMUSCULAR | Status: AC | PRN
  Administered 2024-01-31: 100 mL via INTRAVENOUS

## 2024-01-31 MED ORDER — ACETAMINOPHEN 325 MG PO TABS
650.0000 mg | ORAL_TABLET | Freq: Four times a day (QID) | ORAL | Status: DC | PRN
  Administered 2024-02-01 – 2024-02-02 (×3): 650 mg via ORAL
  Filled 2024-01-31 (×3): qty 2

## 2024-01-31 MED ORDER — SPIRONOLACTONE 25 MG PO TABS
25.0000 mg | ORAL_TABLET | Freq: Every day | ORAL | Status: DC
  Administered 2024-01-31 – 2024-02-01 (×2): 25 mg via ORAL
  Filled 2024-01-31 (×3): qty 1

## 2024-01-31 MED ORDER — MAGNESIUM OXIDE -MG SUPPLEMENT 400 (240 MG) MG PO TABS
200.0000 mg | ORAL_TABLET | Freq: Every day | ORAL | Status: DC
  Administered 2024-01-31 – 2024-02-02 (×3): 200 mg via ORAL
  Filled 2024-01-31 (×3): qty 1

## 2024-01-31 MED ORDER — MAGNESIUM OXIDE 250 MG PO TABS: 1.0000 | ORAL_TABLET | Freq: Every day | ORAL | Status: DC

## 2024-01-31 MED ORDER — ENOXAPARIN SODIUM 40 MG/0.4ML IJ SOSY
40.0000 mg | PREFILLED_SYRINGE | INTRAMUSCULAR | Status: DC
  Administered 2024-01-31 – 2024-02-02 (×3): 40 mg via SUBCUTANEOUS
  Filled 2024-01-31 (×3): qty 0.4

## 2024-01-31 NOTE — H&P (Signed)
 " History and Physical    Tanya Carroll FMW:[id number] DOB: Jun 19, 1985 DOA: 01/30/2024  PCP: [name], NP   Chief Complaint: Abdominal pain  HPI: Tanya Carroll is a 39 y.o. female with medical history significant of alcoholic cirrhosis, hypertension who presents emergency department with intractable nausea vomiting and pain.  Patient has been having worsening nausea and poor p.o. intake.  She is followed with GI for cirrhosis.  She recently had a relapse and drank alcohol over New Year's.  She presented to emergency department where she was found to be tachycardic and hemodynamically able.  Labs were obtained which showed lipase 1791, AST 219, ALT 63, creatinine 0.58, urinalysis concerning for infection.  CT abdomen showed acute pancreatitis.  Patient was admitted for further workup.   Review of Systems: Review of Systems  Constitutional: Negative.  Negative for chills and fever.  HENT: Negative.    Eyes: Negative.   Respiratory: Negative.    Cardiovascular: Negative.   Gastrointestinal: Negative.   Genitourinary: Negative.   Musculoskeletal: Negative.   Skin: Negative.   Neurological: Negative.   Endo/Heme/Allergies: Negative.   Psychiatric/Behavioral: Negative.    All other systems reviewed and are negative.    As per HPI otherwise 10 point review of systems negative.   Allergies[1]  Past Medical History:  Diagnosis Date   Cirrhosis of liver (HCC) 06/14/2023   Hypertension    Neuropathy    Placenta previa     No past surgical history on file.   reports that she has never smoked. She has never used smokeless tobacco. She reports current alcohol use of about 2.0 standard drinks of alcohol per week. She reports that she does not use drugs.  Family History  Problem Relation Age of Onset   Hypertension Mother    Hypertension Sister    Colon cancer Neg Hx    Stomach cancer Neg Hx    Esophageal cancer Neg Hx     Prior to Admission medications   Medication Sig Start Date End Date Taking? Authorizing Provider  alum & mag hydroxide-simeth (MAALOX MAX) 400-400-40 MG/5ML suspension Take 15 mLs by mouth every 8 (eight) hours as needed for indigestion. 05/21/23   Elnor Hila P, DO  dicyclomine  (BENTYL ) 20 MG tablet Take 1 tablet (20 mg total) by mouth 2 (two) times daily. 06/17/23   Lue Elsie BROCKS, MD  famotidine  (PEPCID ) 20 MG tablet Take 1 tablet (20 mg total) by mouth every 12 (twelve) hours as needed for heartburn or indigestion. 05/21/23   Elnor Hila P, DO  folic acid  (FOLVITE ) 1 MG tablet Take 1 tablet (1 mg total) by mouth daily. 06/20/23   Jadine Toribio SQUIBB, MD  furosemide  (LASIX ) 20 MG tablet Take 20 mg by mouth daily. 09/02/21   [provider]  omeprazole  (PRILOSEC) 20 MG capsule Take 1 capsule (20 mg total) by mouth daily for 14 days. Patient taking differently: Take 20 mg by mouth daily as needed (heartburn). 01/31/23 06/13/24  Smoot, Lauraine LABOR, PA-C  ondansetron  (ZOFRAN ) 4 MG tablet Take 1 tablet (4 mg total) by mouth every 6 (six) hours as needed for nausea. 06/17/23   Lue Elsie BROCKS, MD  Oxycodone  HCl 10 MG TABS Take 10 mg by mouth 4 (four) times daily. 08/08/21   [provider]  polyethylene glycol powder (GLYCOLAX /MIRALAX ) 17 GM/SCOOP powder Dissolve 17 g in liquid and take by mouth 2 (two) times daily. 06/17/23   Lue Elsie BROCKS, MD  potassium chloride  SA (KLOR-CON  M) 20 MEQ tablet  Take 1 tablet (20 mEq total) by mouth daily. 06/17/23   Lue Elsie BROCKS, MD  promethazine  (PHENERGAN ) 12.5 MG tablet Take 1 tablet (12.5 mg total) by mouth every 6 (six) hours as needed for refractory nausea / vomiting. 06/17/23   Lue Elsie BROCKS, MD  pyridOXINE  (B-6) 50 MG tablet Take 1 tablet (50 mg total) by mouth daily. 06/20/23   Jadine Toribio SQUIBB, MD  senna-docusate (SENOKOT-S) 8.6-50 MG tablet Take 1 tablet by mouth 2 (two) times daily. 06/17/23   Lue Elsie BROCKS, MD  spironolactone  (ALDACTONE ) 25 MG  tablet Take 25 mg by mouth daily.    [provider]    Physical Exam: Vitals:   01/31/24 0150 01/31/24 0200 01/31/24 0230 01/31/24 0330  BP:  (!) 137/102 131/87 (!) 133/93  Pulse: (!) 108 (!) 112 (!) 107 (!) 110  Resp: 16 17 16 16   Temp: 98 F (36.7 C)     TempSrc: Oral     SpO2: 100% 100% 97% 97%   Physical Exam Constitutional:      Appearance: She is normal weight.  HENT:     Head: Normocephalic.     Mouth/Throat:     Mouth: Mucous membranes are moist.  Eyes:     Extraocular Movements: Extraocular movements intact.  Cardiovascular:     Rate and Rhythm: Normal rate and regular rhythm.     Heart sounds: Normal heart sounds.  Pulmonary:     Effort: Pulmonary effort is normal.     Breath sounds: Normal breath sounds.  Abdominal:     General: Abdomen is flat.     Palpations: Abdomen is soft.  Skin:    General: Skin is warm.     Capillary Refill: Capillary refill takes less than 2 seconds.  Neurological:     General: No focal deficit present.     Mental Status: She is alert.  Psychiatric:        Mood and Affect: Mood normal.       Labs on Admission: I have personally reviewed the patients's labs and imaging studies.  Assessment/Plan Principal Problem:   Alcoholic pancreatitis   # Acute alcoholic pancreatitis - Patient presented with lipase 1700 - Actively drinking over 9 years - History of alcoholic cirrhosis  Plan: Continue IV fluids Pain control Clear liquid diet and advance as tolerated Encourage alcohol cessation  # Decompensated alcoholic cirrhosis-continue spironolactone , hold Lasix .  Follow-up with GI   Admission status: Inpatient Med-Surg  Certification: The appropriate patient status for this patient is INPATIENT. Inpatient status is judged to be reasonable and necessary in order to provide the required intensity of service to ensure the patient's safety. The patient's presenting symptoms, physical exam findings, and initial  radiographic and laboratory data in the context of their chronic comorbidities is felt to place them at high risk for further clinical deterioration. Furthermore, it is not anticipated that the patient will be medically stable for discharge from the hospital within 2 midnights of admission.   * I certify that at the point of admission it is my clinical judgment that the patient will require inpatient hospital care spanning beyond 2 midnights from the point of admission due to high intensity of service, high risk for further deterioration and high frequency of surveillance required.DEWAINE Lamar Dess MD [facility] If 7PM-7AM, please contact night-coverage [email or web address]  01/31/2024, 4:26 AM        [1]  Allergies Allergen Reactions   Citrus Itching and Swelling   Keflex  [  Cephalexin ] Anaphylaxis and Shortness Of Breath   "

## 2024-01-31 NOTE — Plan of Care (Signed)

## 2024-01-31 NOTE — Progress Notes (Signed)
" °  [facility] Progress Note  Patient: Tanya Carroll     FMW:[id number]  DOA: 01/30/2024   PCP: [name], NP       Brief hospital course: 39 y/o female with eTOH abuse and cirrhosis due to this presents with abdominal pain and is found to have a lipase level of 1791 and AST 219 and ALT 63 and T bili 1.3.  Acute pancreatitis on CT imaging.   Subjective:  She continues to have abdominal pain and mild nausea. She does not admit to having any ETOH withdrawal symptoms.   Assessment and Plan: Principal Problem:   Alcoholic pancreatitis - cont IVF, pain control and clear liquid diet   Active Problems:     Cirrhosis of liver (HCC)   ETOH abuse - have counseled her - cont folate and thiamine    HTN - cont Aldactone   Allergic reaction?  - Face appears swollen on exam but she does not admit to any itching  Macrocytosis - B12 deficiency       Code Status: Full Code Total time on patient care: 35 min DVT prophylaxis:  enoxaparin  (LOVENOX ) injection 40 mg Start: 01/31/24 1000 SCDs Start: 01/31/24 0426     Objective:   Vitals:   01/31/24 0745 01/31/24 0748 01/31/24 1000 01/31/24 1014  BP: 126/82  136/89   Pulse: (!) 113  (!) 110   Resp: 18  16 16   Temp:  98.3 F (36.8 C)    TempSrc:  Oral    SpO2: 94%  97%    There were no vitals filed for this visit. Exam: General exam: Appears comfortable - eyes, cheeks and lips look swollen HEENT: oral mucosa moist Respiratory system: Clear to auscultation.  Cardiovascular system: S1 & S2 heard  Gastrointestinal system: Abdomen soft, diffuse abdominal pain, nondistended. Normal bowel sounds   Extremities: No cyanosis, clubbing or edema Psychiatry:  Mood & affect appropriate.      CBC: Recent Labs  Lab 01/30/24 1939 01/31/24 0503  WBC 7.2 7.0  HGB 17.7* 14.8  HCT 52.4* 43.9  MCV 108.5* 107.6*  PLT 137* 168   Basic Metabolic Panel: Recent Labs  Lab 01/30/24 1939 01/31/24 0503  NA 137 137  K  3.7 3.7  CL 97* 99  CO2 25 25  GLUCOSE 130* 98  BUN 6 5*  CREATININE 0.58 0.50  CALCIUM 9.3 8.4*     Scheduled Meds:  enoxaparin  (LOVENOX ) injection  40 mg Subcutaneous Q24H   folic acid   1 mg Oral Daily   oxyCODONE   10 mg Oral QID   spironolactone   25 mg Oral Daily    Imaging and lab data personally reviewed   Author: Wasyl Dornfeld  01/31/2024 11:30 AM  To contact [facility]>   Check the care team in [facility] and look for the attending/consulting [facility] provider listed  Log into [email or web address] and use [place]'s universal password   Go to> [facility]  and find provider  If you still have difficulty reaching the provider, please page the [facility] (Director on Call) for the Hospitalists listed on amion     "

## 2024-02-01 DIAGNOSIS — K852 Alcohol induced acute pancreatitis without necrosis or infection: Secondary | ICD-10-CM

## 2024-02-01 LAB — CBC
HCT: 41.6 % (ref 36.0–46.0)
Hemoglobin: 14 g/dL (ref 12.0–15.0)
MCH: 36 pg — ABNORMAL HIGH (ref 26.0–34.0)
MCHC: 33.7 g/dL (ref 30.0–36.0)
MCV: 106.9 fL — ABNORMAL HIGH (ref 80.0–100.0)
Platelets: 166 K/uL (ref 150–400)
RBC: 3.89 MIL/uL (ref 3.87–5.11)
RDW: 13.1 % (ref 11.5–15.5)
WBC: 5.3 K/uL (ref 4.0–10.5)
nRBC: 0 % (ref 0.0–0.2)

## 2024-02-01 LAB — COMPREHENSIVE METABOLIC PANEL WITH GFR
ALT: 29 U/L (ref 0–44)
AST: 75 U/L — ABNORMAL HIGH (ref 15–41)
Albumin: 3 g/dL — ABNORMAL LOW (ref 3.5–5.0)
Alkaline Phosphatase: 105 U/L (ref 38–126)
Anion gap: 10 (ref 5–15)
BUN: <5 mg/dL — ABNORMAL LOW (ref 6–20)
CO2: 27 mmol/L (ref 22–32)
Calcium: 8.2 mg/dL — ABNORMAL LOW (ref 8.9–10.3)
Chloride: 101 mmol/L (ref 98–111)
Creatinine, Ser: 0.47 mg/dL (ref 0.44–1.00)
GFR, Estimated: >60 mL/min
Glucose, Bld: 104 mg/dL — ABNORMAL HIGH (ref 70–99)
Potassium: 3.4 mmol/L — ABNORMAL LOW (ref 3.5–5.1)
Sodium: 138 mmol/L (ref 135–145)
Total Bilirubin: 1 mg/dL (ref 0.0–1.2)
Total Protein: 6 g/dL — ABNORMAL LOW (ref 6.5–8.1)

## 2024-02-01 LAB — GLUCOSE, CAPILLARY: Glucose-Capillary: 152 mg/dL — ABNORMAL HIGH (ref 70–99)

## 2024-02-01 MED ORDER — THIAMINE HCL 100 MG/ML IJ SOLN
100.0000 mg | Freq: Every day | INTRAMUSCULAR | Status: DC
  Filled 2024-02-01: qty 2

## 2024-02-01 MED ORDER — OXYCODONE HCL 5 MG PO TABS
10.0000 mg | ORAL_TABLET | Freq: Four times a day (QID) | ORAL | Status: DC | PRN
  Administered 2024-02-01 – 2024-02-02 (×2): 10 mg via ORAL
  Filled 2024-02-01 (×3): qty 2

## 2024-02-01 MED ORDER — ADULT MULTIVITAMIN W/MINERALS CH
1.0000 | ORAL_TABLET | Freq: Every day | ORAL | Status: DC
  Administered 2024-02-01 – 2024-02-02 (×2): 1 via ORAL
  Filled 2024-02-01 (×2): qty 1

## 2024-02-01 MED ORDER — THIAMINE MONONITRATE 100 MG PO TABS
100.0000 mg | ORAL_TABLET | Freq: Every day | ORAL | Status: DC
  Administered 2024-02-01 – 2024-02-02 (×2): 100 mg via ORAL
  Filled 2024-02-01 (×2): qty 1

## 2024-02-01 MED ORDER — LORAZEPAM 1 MG PO TABS
1.0000 mg | ORAL_TABLET | ORAL | Status: DC | PRN
  Administered 2024-02-01 – 2024-02-02 (×2): 1 mg via ORAL
  Filled 2024-02-01 (×2): qty 1

## 2024-02-01 MED ORDER — LORAZEPAM 2 MG/ML IJ SOLN: 1.0000 mg | INTRAMUSCULAR | Status: DC | PRN

## 2024-02-01 NOTE — Plan of Care (Signed)
" °  Problem: Health Behavior/Discharge Planning: Goal: Ability to manage health-related needs will improve Outcome: Progressing   Problem: Clinical Measurements: Goal: Cardiovascular complication will be avoided Outcome: Progressing   Problem: Activity: Goal: Risk for activity intolerance will decrease Outcome: Progressing   Problem: Elimination: Goal: Will not experience complications related to bowel motility Outcome: Progressing Goal: Will not experience complications related to urinary retention Outcome: Progressing   Problem: Pain Managment: Goal: General experience of comfort will improve and/or be controlled Outcome: Progressing   Problem: Safety: Goal: Ability to remain free from injury will improve Outcome: Progressing   "

## 2024-02-01 NOTE — Progress Notes (Signed)
" °  [facility] Progress Note  Patient: Tanya Carroll     FMW:[id number]  DOA: 01/30/2024   PCP: [name], NP       Brief hospital course: 39 y/o female with eTOH abuse and cirrhosis due to this presents with abdominal pain and is found to have a lipase level of 1791 and AST 219 and ALT 63 and T bili 1.3.  Acute pancreatitis on CT imaging.   Subjective:  No abdominal pain or nausea  Assessment and Plan: Principal Problem:   Alcoholic pancreatitis - cont IVF, pain control and advance diet  Active Problems:     Cirrhosis of liver (HCC)   ETOH abuse - have counseled her - cont folate and thiamine  - no withdrawal noted  HTN - cont Aldactone   Allergic reaction?  - Face appears swollen on exam but she does not admit to any itching- slightly improved today  Macrocytosis - h/o B12 deficiency       Code Status: Full Code Total time on patient care: 35 min DVT prophylaxis:  enoxaparin  (LOVENOX ) injection 40 mg Start: 01/31/24 1000 SCDs Start: 01/31/24 0426     Objective:   Vitals:   02/01/24 0316 02/01/24 0332 02/01/24 0404 02/01/24 0919  BP: (!) 151/91 (!) 142/99 (!) 144/95 (!) 148/99  Pulse: (!) 102 (!) 106 92 (!) 103  Resp: 20   18  Temp: 98.7 F (37.1 C)   98.1 F (36.7 C)  TempSrc: Oral   Oral  SpO2: 98%   99%  Weight:      Height:       Filed Weights   01/31/24 1421  Weight: 75.8 kg   Exam: General exam: Appears comfortable -   HEENT: oral mucosa moist Respiratory system: Clear to auscultation.  Cardiovascular system: S1 & S2 heard  Gastrointestinal system: Abdomen soft,non tender, nondistended. Normal bowel sounds   Extremities: No cyanosis, clubbing or edema Psychiatry:  Mood & affect appropriate.      CBC: Recent Labs  Lab 01/30/24 1939 01/31/24 0503 02/01/24 0451  WBC 7.2 7.0 5.3  HGB 17.7* 14.8 14.0  HCT 52.4* 43.9 41.6  MCV 108.5* 107.6* 106.9*  PLT 137* 168 166   Basic Metabolic Panel: Recent Labs  Lab  01/30/24 1939 01/31/24 0503 02/01/24 0451  NA 137 137 138  K 3.7 3.7 3.4*  CL 97* 99 101  CO2 25 25 27   GLUCOSE 130* 98 104*  BUN 6 5* <5*  CREATININE 0.58 0.50 0.47  CALCIUM 9.3 8.4* 8.2*     Scheduled Meds:  enoxaparin  (LOVENOX ) injection  40 mg Subcutaneous Q24H   folic acid   1 mg Oral Daily   magnesium  oxide  200 mg Oral Daily   multivitamin with minerals  1 tablet Oral Daily   oxyCODONE   10 mg Oral QID   spironolactone   25 mg Oral Daily   thiamine   100 mg Oral Daily   Or   thiamine   100 mg Intravenous Daily    Imaging and lab data personally reviewed   Author: Liann Spaeth  02/01/2024 10:20 AM  To contact [facility]>   Check the care team in [facility] and look for the attending/consulting [facility] provider listed  Log into [email or web address] and use [place]'s universal password   Go to> [facility]  and find provider  If you still have difficulty reaching the provider, please page the [facility] (Director on Call) for the Hospitalists listed on amion     "

## 2024-02-01 NOTE — Progress Notes (Signed)
" °   02/01/24 0316 02/01/24 0332  Assess: MEWS Score  BP (!) 151/91 (!) 142/99  MAP (mmHg) 111  --   Pulse Rate (!) 102 (!) 106   RN asked the patient how much alcohol she typically drinks per day. Patient states she drinks 8-10 shots of vodka per day. Last drink was on 01/29/2024. Patient states she feels like she is starting to withdrawal. CIWA score 5  RN notified Lynwood Kipper, NP via secure chat of the above information.   Orders Received: CIWA protocol for Medsurg (see orders), prn ativan  based on CIWA score  Intervention: 1mg  po ativan  administered for CIWA score 5 based on scale in MAR.  "

## 2024-02-01 NOTE — Plan of Care (Signed)
  Problem: Clinical Measurements: Goal: Will remain free from infection Outcome: Progressing Goal: Diagnostic test results will improve Outcome: Progressing   Problem: Activity: Goal: Risk for activity intolerance will decrease Outcome: Progressing   Problem: Nutrition: Goal: Adequate nutrition will be maintained Outcome: Progressing

## 2024-02-02 DIAGNOSIS — K852 Alcohol induced acute pancreatitis without necrosis or infection: Secondary | ICD-10-CM | POA: Diagnosis not present

## 2024-02-02 LAB — URINE CULTURE: Culture: >=100000 — AB

## 2024-02-02 MED ORDER — AMLODIPINE BESYLATE 5 MG PO TABS
5.0000 mg | ORAL_TABLET | Freq: Every day | ORAL | Status: DC
  Administered 2024-02-02: 5 mg via ORAL
  Filled 2024-02-02: qty 1

## 2024-02-02 MED ORDER — CIPROFLOXACIN HCL 500 MG PO TABS: 500.0000 mg | ORAL_TABLET | Freq: Two times a day (BID) | ORAL | 0 refills | Status: AC

## 2024-02-02 MED ORDER — VITAMIN B-1 100 MG PO TABS: 100.0000 mg | ORAL_TABLET | Freq: Every day | ORAL | 0 refills | Status: AC

## 2024-02-02 MED ORDER — FOLIC ACID 1 MG PO TABS: 1.0000 mg | ORAL_TABLET | Freq: Every day | ORAL | 0 refills | Status: AC

## 2024-02-02 MED ORDER — CEPHALEXIN 500 MG PO CAPS: 500.0000 mg | ORAL_CAPSULE | Freq: Two times a day (BID) | ORAL | Status: DC

## 2024-02-02 MED ORDER — CEPHALEXIN 500 MG PO CAPS: 500.0000 mg | ORAL_CAPSULE | Freq: Two times a day (BID) | ORAL | 0 refills | Status: DC

## 2024-02-02 MED ORDER — ADULT MULTIVITAMIN W/MINERALS CH: 1.0000 | ORAL_TABLET | Freq: Every day | ORAL | 0 refills | Status: AC

## 2024-02-02 MED ORDER — AMLODIPINE BESYLATE 5 MG PO TABS: 5.0000 mg | ORAL_TABLET | Freq: Every day | ORAL | 0 refills | Status: AC

## 2024-02-02 MED ORDER — CIPROFLOXACIN HCL 500 MG PO TABS
500.0000 mg | ORAL_TABLET | Freq: Once | ORAL | Status: AC
  Administered 2024-02-02: 500 mg via ORAL
  Filled 2024-02-02: qty 1

## 2024-02-02 NOTE — Progress Notes (Deleted)
 " [facility] Progress Note  Patient: Tanya Carroll     FMW:[id number]  DOA: 01/30/2024   PCP: [name], NP       Brief hospital course: 39 y/o female with eTOH abuse and cirrhosis due to this presents with abdominal pain and is found to have a lipase level of 1791 and AST 219 and ALT 63 and T bili 1.3.  Acute pancreatitis on CT imaging.   Subjective:  She states she did not try solid food yesterday as she had no appetite and has had intermittently pelvic pain when urinating.   Assessment and Plan: Principal Problem:   Alcoholic pancreatitis -  dc narcotics as she is exteremly somnolent- attempting to avance diet  Active Problems:     Cirrhosis of liver (HCC)   ETOH abuse - have counseled her - cont folate and thiamine  -  received Ativan  for a CIWA of 4 this AM  HTN - add amlodipine   Allergic reaction?  - Face appears swollen on exam but she does not admit to any itching- improving  Macrocytosis - h/o B12 deficiency       Code Status: Full Code Total time on patient care: 35 min DVT prophylaxis:  enoxaparin  (LOVENOX ) injection 40 mg Start: 01/31/24 1000 SCDs Start: 01/31/24 0426     Objective:   Vitals:   02/01/24 2040 02/02/24 0207 02/02/24 0432 02/02/24 0758  BP: (!) 135/97 (!) 150/102 (!) 150/100 (!) 135/100  Pulse: (!) 101 90 89 80  Resp: 18 18  16   Temp: 98.5 F (36.9 C) 98 F (36.7 C)  97.9 F (36.6 C)  TempSrc: Oral Oral  Oral  SpO2: 97% 100%  95%  Weight:      Height:       Filed Weights   01/31/24 1421  Weight: 75.8 kg   Exam: General exam: Appears comfortable -  very somnolent HEENT: oral mucosa moist Respiratory system: Clear to auscultation.  Cardiovascular system: S1 & S2 heard  Gastrointestinal system: Abdomen soft, non tender, nondistended. Normal bowel sounds   Extremities: No cyanosis, clubbing or edema Psychiatry:  Mood & affect appropriate.      CBC: Recent Labs  Lab 01/30/24 1939 01/31/24 0503  02/01/24 0451  WBC 7.2 7.0 5.3  HGB 17.7* 14.8 14.0  HCT 52.4* 43.9 41.6  MCV 108.5* 107.6* 106.9*  PLT 137* 168 166   Basic Metabolic Panel: Recent Labs  Lab 01/30/24 1939 01/31/24 0503 02/01/24 0451  NA 137 137 138  K 3.7 3.7 3.4*  CL 97* 99 101  CO2 25 25 27   GLUCOSE 130* 98 104*  BUN 6 5* <5*  CREATININE 0.58 0.50 0.47  CALCIUM 9.3 8.4* 8.2*     Scheduled Meds:  amLODipine   5 mg Oral Daily   enoxaparin  (LOVENOX ) injection  40 mg Subcutaneous Q24H   folic acid   1 mg Oral Daily   magnesium  oxide  200 mg Oral Daily   multivitamin with minerals  1 tablet Oral Daily   thiamine   100 mg Oral Daily   Or   thiamine   100 mg Intravenous Daily    Imaging and lab data personally reviewed   Author: Antwian Santaana  02/02/2024 11:33 AM  To contact [facility]>   Check the care team in [facility] and look for the attending/consulting [facility] provider listed  Log into [email or web address] and use [facility]'s universal password   Go to> [facility]  and find provider  If you still have difficulty reaching the provider,  please page the [facility] (Director on Call) for the Hospitalists listed on amion     "

## 2024-02-02 NOTE — Plan of Care (Signed)
  Problem: Clinical Measurements: Goal: Respiratory complications will improve Outcome: Progressing Goal: Cardiovascular complication will be avoided Outcome: Progressing   Problem: Activity: Goal: Risk for activity intolerance will decrease Outcome: Progressing   Problem: Nutrition: Goal: Adequate nutrition will be maintained Outcome: Progressing   Problem: Pain Managment: Goal: General experience of comfort will improve and/or be controlled Outcome: Progressing   Problem: Safety: Goal: Ability to remain free from injury will improve Outcome: Progressing   Problem: Skin Integrity: Goal: Risk for impaired skin integrity will decrease Outcome: Progressing

## 2024-02-02 NOTE — Discharge Summary (Addendum)
 Physician Discharge Summary  Tanya Carroll FMW:[id number] DOB: 09-14-1985 DOA: 01/30/2024  PCP: Tanya Lamarr BRAVO, NP  Admit date: 01/30/2024 Discharge date: 02/02/2024 Discharging to: home Recommendations for Outpatient Follow-up:  Please continue to encourage abstinence from alcohol Please f/u BP at next visit     Discharge Diagnoses:   Principal Problem:   Alcoholic pancreatitis Active Problems:   B12 deficiency   Cirrhosis of liver (HCC)   ETOH abuse   UTI   Brief hospital course: 39 y/o female with eTOH abuse and cirrhosis due to this presents with abdominal pain and is found to have a lipase level of 1791 and AST 219 and ALT 63 and T bili 1.3.  Acute pancreatitis on CT imaging.    Subjective:  Has had solid food today w/o issues.   Assessment and Plan: Principal Problem:   Alcoholic pancreatitis - diet slowly advanced   Active Problems:     Cirrhosis of liver (HCC)   ETOH abuse - have counseled her about alcohol cessation - cont folate and thiamine  - cont lactulose   UTI - has pelvic pain with urination - U cx showing > 100,000 colonies of e coli - will treat for 3 days   HTN - added amlodipine  for BPs in 150/100 range - cont Spironolactone  and Lasix    Allergic reaction?  - Face appears swollen on exam but she does not admit to any itching- improving   Macrocytosis - due to ETOH and  h/o B12 deficiency   Obesity Class 1  Body mass index is 28.68 kg/m.              Discharge Instructions   Allergies as of 02/02/2024       Reactions   Citrus Itching, Swelling   Keflex  [cephalexin ] Anaphylaxis, Shortness Of Breath        Medication List     STOP taking these medications    omeprazole  20 MG capsule Commonly known as: PRILOSEC   Oxycodone  HCl 10 MG Tabs       TAKE these medications    amLODipine  5 MG tablet Commonly known as: NORVASC  Take 1 tablet (5 mg total) by mouth daily. Start taking on: February 03, 2024    ciprofloxacin  500 MG tablet Commonly known as: Cipro  Take 1 tablet (500 mg total) by mouth 2 (two) times daily for 5 doses. Start taking on: February 03, 2024   famotidine  20 MG tablet Commonly known as: PEPCID  Take 1 tablet (20 mg total) by mouth every 12 (twelve) hours as needed for heartburn or indigestion.   folic acid  1 MG tablet Commonly known as: FOLVITE  Take 1 tablet (1 mg total) by mouth daily.   furosemide  20 MG tablet Commonly known as: LASIX  Take 20 mg by mouth daily.   lactulose  10 GM/15ML solution Commonly known as: CHRONULAC  Take 10 g by mouth 3 (three) times daily as needed for moderate constipation.   Maalox Max 400-400-40 MG/5ML suspension Generic drug: alum & mag hydroxide-simeth Take 15 mLs by mouth every 8 (eight) hours as needed for indigestion.   Magnesium  Oxide 250 MG Tabs Take 1 tablet by mouth daily.   multivitamin with minerals Tabs tablet Take 1 tablet by mouth daily. Start taking on: February 03, 2024   ondansetron  4 MG tablet Commonly known as: ZOFRAN  Take 1 tablet (4 mg total) by mouth every 6 (six) hours as needed for nausea.   pantoprazole  40 MG tablet Commonly known as: PROTONIX  Take 40 mg by mouth daily as needed (Indigestion).  promethazine  12.5 MG tablet Commonly known as: PHENERGAN  Take 1 tablet (12.5 mg total) by mouth every 6 (six) hours as needed for refractory nausea / vomiting.   spironolactone  25 MG tablet Commonly known as: ALDACTONE  Take 25 mg by mouth daily.   thiamine  100 MG tablet Commonly known as: Vitamin B-1 Take 1 tablet (100 mg total) by mouth daily. Start taking on: February 03, 2024         Follow-up Information     Tanya Lamarr BRAVO, NP Follow up.   Specialty: Nurse Practitioner Contact information: [address] [place] [address] [phone]                    The results of significant diagnostics from this hospitalization (including imaging, microbiology, ancillary  and laboratory) are listed below for reference.    CT ABDOMEN PELVIS W CONTRAST Result Date: 01/31/2024 EXAM: CT ABDOMEN AND PELVIS WITH CONTRAST 01/31/2024 12:30:18 AM TECHNIQUE: CT of the abdomen and pelvis was performed with the administration of 100 mL of iohexol  (OMNIPAQUE ) 300 MG/ML solution. Multiplanar reformatted images are provided for review. Automated exposure control, iterative reconstruction, and/or weight-based adjustment of the mA/kV was utilized to reduce the radiation dose to as low as reasonably achievable. COMPARISON: 06/14/2023. CLINICAL HISTORY: Pancreatitis, acute, initial episode (Ped 0-17y); hx cirrhosis, lipase 1800. FINDINGS: LOWER CHEST: Bibasilar atelectasis. LIVER: Diffuse low density throughout the liver, compatible with fatty infiltration. GALLBLADDER AND BILE DUCTS: Gallbladder is unremarkable. No biliary ductal dilatation. SPLEEN: No acute abnormality. PANCREAS: Edema surrounding the pancreas with fluid tracking in the anterior pararenal spaces bilaterally as well as in the transverse mesocolon compatible with acute pancreatitis. ADRENAL GLANDS: No acute abnormality. KIDNEYS, URETERS AND BLADDER: No stones in the kidneys or ureters. No hydronephrosis. No perinephric or periureteral stranding. Urinary bladder is unremarkable. GI AND BOWEL: Stomach demonstrates no acute abnormality. Large stool burden throughout the colon. There is no bowel obstruction. PERITONEUM AND RETROPERITONEUM: Small amount of perihepatic ascites. No free air. VASCULATURE: Aorta is normal in caliber. LYMPH NODES: No lymphadenopathy. REPRODUCTIVE ORGANS: 3 cm right ovarian cyst. Uterus and left ovary unremarkable. BONES AND SOFT TISSUES: No acute osseous abnormality. No focal soft tissue abnormality. IMPRESSION: 1. Acute pancreatitis changes. 2. Fatty infiltration of the liver. 3. Small amount of perihepatic ascites. Electronically signed by: Franky Crease MD 01/31/2024 12:35 AM EST RP Workstation: [id number]    Labs:   Basic Metabolic Panel: Recent Labs  Lab 01/30/24 1939 01/31/24 0503 02/01/24 0451  NA 137 137 138  K 3.7 3.7 3.4*  CL 97* 99 101  CO2 25 25 27   GLUCOSE 130* 98 104*  BUN 6 5* <5*  CREATININE 0.58 0.50 0.47  CALCIUM 9.3 8.4* 8.2*     CBC: Recent Labs  Lab 01/30/24 1939 01/31/24 0503 02/01/24 0451  WBC 7.2 7.0 5.3  HGB 17.7* 14.8 14.0  HCT 52.4* 43.9 41.6  MCV 108.5* 107.6* 106.9*  PLT 137* 168 166         SIGNED:   True Atlas, MD  [facility] 02/02/2024, 4:14 PM Time taking on discharge: 50 minutes

## 2024-02-02 NOTE — Progress Notes (Signed)
" °   02/02/24 0432  CIWA-Ar  BP (!) 150/100  Pulse Rate 89  Nausea and Vomiting 0  Tactile Disturbances 0  Tremor 2  Auditory Disturbances 0  Paroxysmal Sweats 0  Visual Disturbances 0  Anxiety 0  Headache, Fullness in Head 2  Agitation 1  Orientation and Clouding of Sensorium 0  CIWA-Ar Total 5   RN administered prn tylenol  per the prn order and prn ativan  per the CIWA scale order based on her CIWA score 5 above "

## 2024-02-02 NOTE — Plan of Care (Signed)

## 2024-02-02 NOTE — TOC Initial Note (Signed)
 Transition of Care [facility]) - Initial/Assessment Note    Patient Details  Name: Tanya Carroll MRN: [id number] Date of Birth: September 30, 1985  [facility] CM/SW Contact:    Doneta Glenys DASEN, RN Phone Number: 02/02/2024, 3:53 PM  Clinical Narrative:                 Home with 39 y.o. dnw:[facility] 3X weekly for 4 hours each visit. Renny Landy [facility] [phone] will transport at discharge. Patient stated no resources needed a this time. IP CM signing off.    Expected Discharge Plan: Home/Self Care Barriers to Discharge: Barriers Resolved   Patient Goals and CMS Choice Patient states their goals for this hospitalization and ongoing recovery are:: Home with son CMS Medicare.gov Compare Post Acute Care list provided to:: Patient Choice offered to / list presented to : Patient [place] ownership interest in [facility].provided to:: Patient    Expected Discharge Plan and Services In-house Referral: NA Discharge Planning Services: CM Consult Post Acute Care Choice: NA Living arrangements for the past 2 months: Apartment Expected Discharge Date: 02/02/24               DME Arranged: N/A DME Agency: NA       HH Arranged: NA HH Agency: NA        Prior Living Arrangements/Services Living arrangements for the past 2 months: Apartment Lives with:: Minor Children Patient language and need for interpreter reviewed:: Yes Do you feel safe going back to the place where you live?: Yes      Need for Family Participation in Patient Care: Yes (Comment) Care giver support system in place?: Yes (comment) Current home services: DME, Homehealth aide (rollator, [facility], [employer] 3xweekly for 4 hours) Criminal Activity/Legal Involvement Pertinent to Current Situation/Hospitalization: No - Comment as needed  Activities of Daily Living   ADL Screening (condition at time of admission) Independently performs ADLs?: Yes (appropriate for  developmental age) Is the patient deaf or have difficulty hearing?: No Does the patient have difficulty seeing, even when wearing glasses/contacts?: No Does the patient have difficulty concentrating, remembering, or making decisions?: No  Permission Sought/Granted Permission sought to share information with : Case Manager Permission granted to share information with : Yes, Verbal Permission Granted  Share Information with NAME: Renny Landy Fell Alyse  [phone]           Emotional Assessment Appearance:: Appears stated age Attitude/Demeanor/Rapport: Engaged Affect (typically observed): Appropriate Orientation: : Oriented to Situation, Oriented to  Time, Oriented to Place, Oriented to Self Alcohol / Substance Use: Alcohol Use Psych Involvement: No (comment)  Admission diagnosis:  Alcoholic pancreatitis [K85.20] Alcohol-induced acute pancreatitis, unspecified complication status [K85.20] Patient Active Problem List   Diagnosis Date Noted   Alcoholic pancreatitis 01/31/2024   ETOH abuse 01/31/2024   Abdominal pain with vomiting 06/14/2023   Cirrhosis of liver (HCC) 06/14/2023   Constipation 06/14/2023   Chest pain 06/14/2023   Neuropathy    B12 deficiency 07/20/2020   Idiopathic peripheral neuropathy 07/20/2020   Abnormal MRI of head 07/20/2020   Hypertension 07/20/2020   BMI 26.0-26.9,adult 07/20/2020   PCP:  [name], NP Pharmacy:   [id number] [place], [place] - [address] AT [facility] & [address] [place] [address] Phone: [phone] Fax: [phone]  [facility] [address] [place] [address] Phone: [phone] Fax: [phone]  [facility] [address] [place], [place] - [address] [place] AT [facility] [address] [place] [address] Phone: [phone] Fax:  [phone]  [place] [facility] [address] [place] [address] Phone: [phone] Fax: [phone]     Social Drivers of Health (SDOH) Social History: SDOH Screenings   Food Insecurity: No Food Insecurity (01/31/2024)  Housing: Low Risk (01/31/2024)  Transportation Needs: No Transportation Needs (01/31/2024)  Utilities: Not At Risk (01/31/2024)  Depression (PHQ2-9): Low Risk (09/03/2021)  Tobacco Use: Low Risk (01/31/2024)   SDOH Interventions:     Readmission Risk Interventions    02/02/2024    3:48 PM 06/19/2023    1:56 PM  Readmission Risk Prevention Plan  Post Dischage Appt Complete   Medication Screening Complete   Transportation Screening Complete Complete  PCP or Specialist Appt within 5-7 Days  Complete  Home Care Screening  Complete  Medication Review (RN CM)  Complete

## 2024-02-02 NOTE — Progress Notes (Signed)
 Discharge instructions communicated to patient and family, all questions answered, verbalized understanding. Copy of instructions with e scripts to pharmacy on file.  All personal belongings packed and remain at side.  Discharge to home with family.

## 2024-02-02 NOTE — Progress Notes (Signed)
" °   02/02/24 0207  Assess: MEWS Score  BP (!) 150/102  MAP (mmHg) 118  Pulse Rate 90   Patient states she recently sneezed and caused her abdomen start hurting again. CIWA score 4 at this time.  RN administered prn oxycodone  per the prn order and notified Lynwood Kipper, NP of the above information "
